# Patient Record
Sex: Female | Born: 1935 | Race: White | Hispanic: No | Smoking: Former smoker
Health system: Southern US, Community
[De-identification: ages and names within clinical notes are randomized; demographics above are authoritative.]

## PROBLEM LIST (undated history)

## (undated) DIAGNOSIS — R002 Palpitations: Secondary | ICD-10-CM

## (undated) DIAGNOSIS — F419 Anxiety disorder, unspecified: Secondary | ICD-10-CM

## (undated) DIAGNOSIS — I350 Nonrheumatic aortic (valve) stenosis: Secondary | ICD-10-CM

## (undated) DIAGNOSIS — R079 Chest pain, unspecified: Secondary | ICD-10-CM

## (undated) DIAGNOSIS — K219 Gastro-esophageal reflux disease without esophagitis: Secondary | ICD-10-CM

## (undated) DIAGNOSIS — I10 Essential (primary) hypertension: Secondary | ICD-10-CM

## (undated) DIAGNOSIS — N814 Uterovaginal prolapse, unspecified: Secondary | ICD-10-CM

## (undated) DIAGNOSIS — N289 Disorder of kidney and ureter, unspecified: Secondary | ICD-10-CM

## (undated) DIAGNOSIS — M810 Age-related osteoporosis without current pathological fracture: Secondary | ICD-10-CM

## (undated) DIAGNOSIS — N183 Chronic kidney disease, stage 3 (moderate): Secondary | ICD-10-CM

## (undated) DIAGNOSIS — R011 Cardiac murmur, unspecified: Secondary | ICD-10-CM

## (undated) DIAGNOSIS — Z86718 Personal history of other venous thrombosis and embolism: Secondary | ICD-10-CM

## (undated) DIAGNOSIS — H3530 Unspecified macular degeneration: Secondary | ICD-10-CM

## (undated) DIAGNOSIS — M519 Unspecified thoracic, thoracolumbar and lumbosacral intervertebral disc disorder: Secondary | ICD-10-CM

## (undated) HISTORY — PX: VITRECTOMY: SHX106

## (undated) HISTORY — PX: CHOLECYSTECTOMY: SHX55

## (undated) HISTORY — DX: Personal history of other venous thrombosis and embolism: Z86.718

## (undated) HISTORY — DX: Unspecified thoracic, thoracolumbar and lumbosacral intervertebral disc disorder: M51.9

## (undated) HISTORY — DX: Anxiety disorder, unspecified: F41.9

## (undated) HISTORY — DX: Uterovaginal prolapse, unspecified: N81.4

## (undated) HISTORY — PX: FINGER GANGLION CYST EXCISION: SHX1636

## (undated) HISTORY — PX: CERVICAL POLYPECTOMY: SHX88

## (undated) HISTORY — DX: Disorder of kidney and ureter, unspecified: N28.9

## (undated) HISTORY — DX: Gastro-esophageal reflux disease without esophagitis: K21.9

## (undated) HISTORY — PX: KNEE ARTHROSCOPY: SUR90

## (undated) HISTORY — DX: Palpitations: R00.2

## (undated) HISTORY — DX: Unspecified macular degeneration: H35.30

## (undated) HISTORY — DX: Essential (primary) hypertension: I10

---

## 2000-08-31 ENCOUNTER — Encounter: Payer: Self-pay | Admitting: Internal Medicine

## 2000-08-31 ENCOUNTER — Ambulatory Visit (HOSPITAL_COMMUNITY): Admission: RE | Admit: 2000-08-31 | Discharge: 2000-08-31 | Payer: Self-pay | Admitting: Internal Medicine

## 2001-02-04 ENCOUNTER — Ambulatory Visit (HOSPITAL_COMMUNITY): Admission: RE | Admit: 2001-02-04 | Discharge: 2001-02-04 | Payer: Self-pay | Admitting: Internal Medicine

## 2001-02-11 ENCOUNTER — Emergency Department (HOSPITAL_COMMUNITY): Admission: EM | Admit: 2001-02-11 | Discharge: 2001-02-11 | Payer: Self-pay | Admitting: *Deleted

## 2001-05-16 ENCOUNTER — Ambulatory Visit (HOSPITAL_COMMUNITY): Admission: RE | Admit: 2001-05-16 | Discharge: 2001-05-16 | Payer: Self-pay | Admitting: Internal Medicine

## 2001-05-16 ENCOUNTER — Encounter: Payer: Self-pay | Admitting: Internal Medicine

## 2001-06-05 ENCOUNTER — Ambulatory Visit (HOSPITAL_COMMUNITY): Admission: RE | Admit: 2001-06-05 | Discharge: 2001-06-05 | Payer: Self-pay | Admitting: Internal Medicine

## 2001-06-27 ENCOUNTER — Ambulatory Visit (HOSPITAL_COMMUNITY): Admission: RE | Admit: 2001-06-27 | Discharge: 2001-06-27 | Payer: Self-pay | Admitting: Internal Medicine

## 2001-06-27 ENCOUNTER — Encounter: Payer: Self-pay | Admitting: Internal Medicine

## 2001-08-07 ENCOUNTER — Ambulatory Visit (HOSPITAL_COMMUNITY): Admission: RE | Admit: 2001-08-07 | Discharge: 2001-08-07 | Payer: Self-pay | Admitting: Internal Medicine

## 2001-08-07 ENCOUNTER — Encounter: Payer: Self-pay | Admitting: Internal Medicine

## 2001-08-13 ENCOUNTER — Encounter: Payer: Self-pay | Admitting: Internal Medicine

## 2001-08-13 ENCOUNTER — Ambulatory Visit (HOSPITAL_COMMUNITY): Admission: RE | Admit: 2001-08-13 | Discharge: 2001-08-13 | Payer: Self-pay | Admitting: Internal Medicine

## 2001-10-03 ENCOUNTER — Ambulatory Visit (HOSPITAL_COMMUNITY): Admission: RE | Admit: 2001-10-03 | Discharge: 2001-10-03 | Payer: Self-pay | Admitting: Obstetrics and Gynecology

## 2001-10-03 ENCOUNTER — Encounter (INDEPENDENT_AMBULATORY_CARE_PROVIDER_SITE_OTHER): Payer: Self-pay

## 2002-01-19 ENCOUNTER — Emergency Department (HOSPITAL_COMMUNITY): Admission: EM | Admit: 2002-01-19 | Discharge: 2002-01-19 | Payer: Self-pay | Admitting: Emergency Medicine

## 2002-01-19 ENCOUNTER — Encounter: Payer: Self-pay | Admitting: Emergency Medicine

## 2002-07-16 ENCOUNTER — Ambulatory Visit (HOSPITAL_COMMUNITY): Admission: RE | Admit: 2002-07-16 | Discharge: 2002-07-16 | Payer: Self-pay | Admitting: Internal Medicine

## 2002-07-16 ENCOUNTER — Encounter: Payer: Self-pay | Admitting: Internal Medicine

## 2002-10-18 ENCOUNTER — Emergency Department (HOSPITAL_COMMUNITY): Admission: EM | Admit: 2002-10-18 | Discharge: 2002-10-18 | Payer: Self-pay | Admitting: Emergency Medicine

## 2002-10-18 ENCOUNTER — Encounter: Payer: Self-pay | Admitting: Emergency Medicine

## 2003-01-14 ENCOUNTER — Ambulatory Visit (HOSPITAL_COMMUNITY): Admission: RE | Admit: 2003-01-14 | Discharge: 2003-01-14 | Payer: Self-pay | Admitting: Urology

## 2003-05-02 ENCOUNTER — Emergency Department (HOSPITAL_COMMUNITY): Admission: EM | Admit: 2003-05-02 | Discharge: 2003-05-02 | Payer: Self-pay | Admitting: Emergency Medicine

## 2003-10-01 ENCOUNTER — Ambulatory Visit (HOSPITAL_COMMUNITY): Admission: RE | Admit: 2003-10-01 | Discharge: 2003-10-01 | Payer: Self-pay | Admitting: Internal Medicine

## 2004-02-01 ENCOUNTER — Encounter: Admission: RE | Admit: 2004-02-01 | Discharge: 2004-02-01 | Payer: Self-pay | Admitting: Orthopedic Surgery

## 2004-02-03 ENCOUNTER — Ambulatory Visit (HOSPITAL_COMMUNITY): Admission: RE | Admit: 2004-02-03 | Discharge: 2004-02-03 | Payer: Self-pay | Admitting: Orthopedic Surgery

## 2004-02-03 ENCOUNTER — Encounter (INDEPENDENT_AMBULATORY_CARE_PROVIDER_SITE_OTHER): Payer: Self-pay | Admitting: *Deleted

## 2004-02-03 ENCOUNTER — Ambulatory Visit (HOSPITAL_BASED_OUTPATIENT_CLINIC_OR_DEPARTMENT_OTHER): Admission: RE | Admit: 2004-02-03 | Discharge: 2004-02-03 | Payer: Self-pay | Admitting: Orthopedic Surgery

## 2004-03-10 ENCOUNTER — Ambulatory Visit: Payer: Self-pay | Admitting: Cardiology

## 2004-06-02 ENCOUNTER — Ambulatory Visit (HOSPITAL_COMMUNITY): Admission: RE | Admit: 2004-06-02 | Discharge: 2004-06-02 | Payer: Self-pay | Admitting: Internal Medicine

## 2005-01-26 ENCOUNTER — Encounter (HOSPITAL_COMMUNITY): Admission: RE | Admit: 2005-01-26 | Discharge: 2005-02-25 | Payer: Self-pay | Admitting: Endocrinology

## 2005-03-08 ENCOUNTER — Ambulatory Visit (HOSPITAL_COMMUNITY): Admission: RE | Admit: 2005-03-08 | Discharge: 2005-03-08 | Payer: Self-pay | Admitting: Internal Medicine

## 2005-11-01 ENCOUNTER — Emergency Department (HOSPITAL_COMMUNITY): Admission: EM | Admit: 2005-11-01 | Discharge: 2005-11-01 | Payer: Self-pay | Admitting: Emergency Medicine

## 2005-12-04 ENCOUNTER — Emergency Department (HOSPITAL_COMMUNITY): Admission: EM | Admit: 2005-12-04 | Discharge: 2005-12-04 | Payer: Self-pay | Admitting: Emergency Medicine

## 2006-04-02 ENCOUNTER — Ambulatory Visit (HOSPITAL_COMMUNITY): Admission: RE | Admit: 2006-04-02 | Discharge: 2006-04-02 | Payer: Self-pay | Admitting: Ophthalmology

## 2006-04-19 ENCOUNTER — Ambulatory Visit (HOSPITAL_COMMUNITY): Admission: RE | Admit: 2006-04-19 | Discharge: 2006-04-19 | Payer: Self-pay | Admitting: Internal Medicine

## 2006-09-06 ENCOUNTER — Ambulatory Visit: Payer: Self-pay | Admitting: Orthopedic Surgery

## 2006-10-11 ENCOUNTER — Ambulatory Visit: Payer: Self-pay | Admitting: Orthopedic Surgery

## 2007-05-20 ENCOUNTER — Encounter: Payer: Self-pay | Admitting: Orthopedic Surgery

## 2007-05-20 ENCOUNTER — Emergency Department (HOSPITAL_COMMUNITY): Admission: EM | Admit: 2007-05-20 | Discharge: 2007-05-20 | Payer: Self-pay | Admitting: Emergency Medicine

## 2007-05-21 ENCOUNTER — Ambulatory Visit: Payer: Self-pay | Admitting: Orthopedic Surgery

## 2007-05-30 ENCOUNTER — Emergency Department (HOSPITAL_COMMUNITY): Admission: EM | Admit: 2007-05-30 | Discharge: 2007-05-30 | Payer: Self-pay | Admitting: Emergency Medicine

## 2007-07-03 ENCOUNTER — Ambulatory Visit (HOSPITAL_COMMUNITY): Admission: RE | Admit: 2007-07-03 | Discharge: 2007-07-03 | Payer: Self-pay | Admitting: Internal Medicine

## 2007-07-16 ENCOUNTER — Ambulatory Visit: Payer: Self-pay | Admitting: Orthopedic Surgery

## 2007-07-16 ENCOUNTER — Ambulatory Visit (HOSPITAL_COMMUNITY): Admission: RE | Admit: 2007-07-16 | Discharge: 2007-07-16 | Payer: Self-pay | Admitting: Orthopedic Surgery

## 2007-07-22 ENCOUNTER — Ambulatory Visit (HOSPITAL_COMMUNITY): Admission: RE | Admit: 2007-07-22 | Discharge: 2007-07-22 | Payer: Self-pay | Admitting: Ophthalmology

## 2007-08-12 ENCOUNTER — Ambulatory Visit (HOSPITAL_COMMUNITY): Admission: RE | Admit: 2007-08-12 | Discharge: 2007-08-12 | Payer: Self-pay | Admitting: Ophthalmology

## 2007-10-07 ENCOUNTER — Ambulatory Visit (HOSPITAL_COMMUNITY): Admission: RE | Admit: 2007-10-07 | Discharge: 2007-10-07 | Payer: Self-pay | Admitting: Ophthalmology

## 2008-06-29 ENCOUNTER — Other Ambulatory Visit: Admission: RE | Admit: 2008-06-29 | Discharge: 2008-06-29 | Payer: Self-pay | Admitting: Obstetrics & Gynecology

## 2008-08-31 ENCOUNTER — Ambulatory Visit (HOSPITAL_COMMUNITY): Admission: RE | Admit: 2008-08-31 | Discharge: 2008-08-31 | Payer: Self-pay | Admitting: Internal Medicine

## 2008-11-09 ENCOUNTER — Ambulatory Visit (HOSPITAL_COMMUNITY): Payer: Self-pay | Admitting: Internal Medicine

## 2008-11-09 ENCOUNTER — Encounter (HOSPITAL_COMMUNITY): Admission: RE | Admit: 2008-11-09 | Discharge: 2008-12-09 | Payer: Self-pay | Admitting: Internal Medicine

## 2009-02-08 ENCOUNTER — Ambulatory Visit (HOSPITAL_COMMUNITY): Payer: Self-pay | Admitting: Internal Medicine

## 2009-02-08 ENCOUNTER — Encounter (HOSPITAL_COMMUNITY): Admission: RE | Admit: 2009-02-08 | Discharge: 2009-03-10 | Payer: Self-pay | Admitting: Internal Medicine

## 2009-05-04 ENCOUNTER — Encounter (HOSPITAL_COMMUNITY): Admission: RE | Admit: 2009-05-04 | Discharge: 2009-06-09 | Payer: Self-pay | Admitting: Internal Medicine

## 2009-05-10 ENCOUNTER — Ambulatory Visit (HOSPITAL_COMMUNITY): Payer: Self-pay | Admitting: Internal Medicine

## 2009-08-09 ENCOUNTER — Ambulatory Visit (HOSPITAL_COMMUNITY): Payer: Self-pay | Admitting: Internal Medicine

## 2009-08-09 ENCOUNTER — Encounter (HOSPITAL_COMMUNITY): Admission: RE | Admit: 2009-08-09 | Discharge: 2009-09-08 | Payer: Self-pay | Admitting: Internal Medicine

## 2009-10-17 ENCOUNTER — Emergency Department (HOSPITAL_COMMUNITY)
Admission: EM | Admit: 2009-10-17 | Discharge: 2009-10-17 | Payer: Self-pay | Source: Home / Self Care | Admitting: Emergency Medicine

## 2009-11-08 ENCOUNTER — Encounter (HOSPITAL_COMMUNITY)
Admission: RE | Admit: 2009-11-08 | Discharge: 2009-12-08 | Payer: Self-pay | Source: Home / Self Care | Admitting: Internal Medicine

## 2009-11-08 ENCOUNTER — Ambulatory Visit (HOSPITAL_COMMUNITY): Payer: Self-pay | Admitting: Internal Medicine

## 2009-11-15 ENCOUNTER — Encounter (INDEPENDENT_AMBULATORY_CARE_PROVIDER_SITE_OTHER): Payer: Self-pay

## 2009-11-15 ENCOUNTER — Ambulatory Visit: Payer: Self-pay | Admitting: Cardiology

## 2009-11-15 DIAGNOSIS — I10 Essential (primary) hypertension: Secondary | ICD-10-CM

## 2009-11-15 DIAGNOSIS — R002 Palpitations: Secondary | ICD-10-CM

## 2009-11-16 ENCOUNTER — Encounter: Payer: Self-pay | Admitting: Cardiology

## 2009-11-22 ENCOUNTER — Encounter (HOSPITAL_COMMUNITY): Admission: RE | Admit: 2009-11-22 | Payer: Self-pay | Admitting: Oncology

## 2009-12-16 ENCOUNTER — Ambulatory Visit: Payer: Self-pay | Admitting: Cardiology

## 2009-12-16 ENCOUNTER — Encounter: Payer: Self-pay | Admitting: Adult Health

## 2009-12-16 LAB — CONVERTED CEMR LAB
Chloride: 98 meq/L (ref 96–112)
Creatinine, Ser: 1.46 mg/dL — ABNORMAL HIGH (ref 0.40–1.20)
Potassium: 3.8 meq/L (ref 3.5–5.3)
Sodium: 139 meq/L (ref 135–145)

## 2009-12-23 ENCOUNTER — Telehealth (INDEPENDENT_AMBULATORY_CARE_PROVIDER_SITE_OTHER): Payer: Self-pay

## 2010-01-18 ENCOUNTER — Encounter: Payer: Self-pay | Admitting: Cardiology

## 2010-01-20 ENCOUNTER — Ambulatory Visit (HOSPITAL_COMMUNITY)
Admission: RE | Admit: 2010-01-20 | Discharge: 2010-01-20 | Payer: Self-pay | Source: Home / Self Care | Attending: Internal Medicine | Admitting: Internal Medicine

## 2010-02-06 ENCOUNTER — Encounter: Payer: Self-pay | Admitting: Endocrinology

## 2010-02-07 ENCOUNTER — Ambulatory Visit (HOSPITAL_COMMUNITY)
Admission: RE | Admit: 2010-02-07 | Discharge: 2010-02-15 | Payer: Self-pay | Source: Home / Self Care | Attending: Internal Medicine | Admitting: Internal Medicine

## 2010-02-07 ENCOUNTER — Encounter (HOSPITAL_COMMUNITY)
Admission: RE | Admit: 2010-02-07 | Discharge: 2010-02-15 | Payer: Self-pay | Source: Home / Self Care | Attending: Internal Medicine | Admitting: Internal Medicine

## 2010-02-09 ENCOUNTER — Other Ambulatory Visit: Payer: Self-pay | Admitting: Dermatology

## 2010-02-15 NOTE — Letter (Signed)
Summary: dr fagan records  dr fagan records   Imported By: Faythe Ghee 11/16/2009 13:23:42  _____________________________________________________________________  External Attachment:    Type:   Image     Comment:   External Document

## 2010-02-15 NOTE — Assessment & Plan Note (Signed)
Summary: pt states she has been having "heavy heartbeat"/tg   Visit Type:  Follow-up Primary Provider:  Dr. Carylon Perches  CC:  heavy heartbeat.  History of Present Illness: [name] in a pleasant 77 y/o CF we are following for continued assessment and treatment of palpatations. On last visit, she had a cardionet monitor placed to assess frequency and duration of her complaints.  She decribes this as a "heavy beat" usually occuring in the am.  She is on diltiazem at 30mg  three times a day per Dr. Ouida Sills along with Metroprolol.  She did not take her cardiazem this afternoon, and states she is feeling extra palpatations.  She states she can tell a difference when she does not take the CCB., as her "heavy beats" occur more often.    Current Medications (verified): 1)  Ramipril 10 Mg Caps (Ramipril) .... Take 1 Tab Daily 2)  Aspir-Low 81 Mg Tbec (Aspirin) .... Take 1 Tab Daily 3)  Dulcolax Stool Softener 100 Mg Caps (Docusate Sodium) .... Take 1 Tab Three Times A Day 4)  Hydrochlorothiazide 25 Mg Tabs (Hydrochlorothiazide) .... Take 1/2 Tablet By Mouth Once Daily 5)  Pantoprazole Sodium 40 Mg Tbec (Pantoprazole Sodium) .... Take 1 Tab Daily 6)  Icaps  Caps (Multiple Vitamins-Minerals) .... Take 1 Tab Daily 7)  Diltiazem Hcl 30 Mg Tabs (Diltiazem Hcl) .... Take 3 Tabs Daily 8)  Citalopram Hydrobromide 10 Mg Tabs (Citalopram Hydrobromide) .... Take 1 Tab Daily 9)  Toprol Xl 100 Mg Xr24h-Tab (Metoprolol Succinate) .... Take 1 Tab Daily 10)  Vitamin D3 1000 Unit Caps (Cholecalciferol) .... Take 1 Tab Daily 11)  Boniva 150 Mg Tabs (Ibandronate Sodium) .... Take 1 Every 3 Months  Allergies (verified): 1)  ! * Amoxycillin  Comments:  Nurse/Medical Assistant: patient brought med list [facility] uses [facility] in [place]  Review of Systems       Palpatations  All other systems have been reviewed and are negative unless stated above.   Vital Signs:  Patient profile:   77 year old female Weight:       181 pounds O2 Sat:      92 % on Room air Pulse rate:   76 / minute BP sitting:   107 / 56  (right arm)  Vitals Entered By: Dreama Saa, CNA (December 16, 2009 2:33 PM)  O2 Flow:  Room air  Physical Exam  General:  Well developed, well nourished, in no acute distress. Lungs:  Clear bilaterally to auscultation and percussion. Heart:  Non-displaced PMI, chest non-tender; regular rate and rhythm, S1, S2 without murmurs, rubs or gallops. Carotid upstroke normal, no bruit. Normal abdominal aortic size, no bruits. Femorals normal pulses, no bruits. Pedals normal pulses. No edema, no varicosities. Abdomen:  Bowel sounds positive; abdomen soft and non-tender without masses, organomegaly, or hernias noted. No hepatosplenomegaly. Msk:  Back normal, normal gait. Muscle strength and tone normal. Pulses:  pulses normal in all 4 extremities Extremities:  No clubbing or cyanosis. Neurologic:  Alert and oriented x 3. Psych:  Normal affect.   Impression & Recommendations:  Problem # 1:  PALPITATIONS (ICD-785.1) Review of cardionet recording did not show evidence of atrial tachycardia, frequent PVC's.  It has not been officially read by Dr. Diona Browner yet.  This will be made available for him to review. She is not compaining of "heavy heart beat" unless she does not take cardiazem.  I will check BMET to assess potassium status in the setting of HCTZ taken daily.  She will follow up with Dr. Diona Browner. Her updated medication list for this problem includes:    Ramipril 10 Mg Caps (Ramipril) .Marland Kitchen... Take 1 tab daily    Aspir-low 81 Mg Tbec (Aspirin) .Marland Kitchen... Take 1 tab daily    Diltiazem Hcl 30 Mg Tabs (Diltiazem hcl) .Marland Kitchen... Take 3 tabs daily    Toprol Xl 100 Mg Xr24h-tab (Metoprolol succinate) .Marland Kitchen... Take 1 tab daily  Problem # 2:  ESSENTIAL HYPERTENSION, BENIGN (ICD-401.1) She is mildly hypotensive at present.  Will decrease HCTZ to 12.5 mg daily.  She will have BP check in one week. Her updated  medication list for this problem includes:    Ramipril 10 Mg Caps (Ramipril) .Marland Kitchen... Take 1 tab daily    Aspir-low 81 Mg Tbec (Aspirin) .Marland Kitchen... Take 1 tab daily    Hydrochlorothiazide 25 Mg Tabs (Hydrochlorothiazide) .Marland Kitchen... Take 1/2 tablet by mouth once daily    Diltiazem Hcl 30 Mg Tabs (Diltiazem hcl) .Marland Kitchen... Take 3 tabs daily    Toprol Xl 100 Mg Xr24h-tab (Metoprolol succinate) .Marland Kitchen... Take 1 tab daily  Orders: T-Basic Metabolic Panel [phone])  Patient Instructions: 1)  Your physician recommends that you schedule a follow-up appointment in: 3 months 2)  Your physician recommends that you return for lab work in: today 3)  Your physician has recommended you make the following change in your medication: Decrease Hydrochlorothiazide to 12.5mg  (1/2 tablet of 25mg  tablet) Prescriptions: HYDROCHLOROTHIAZIDE 25 MG TABS (HYDROCHLOROTHIAZIDE) take 1/2 tablet by mouth once daily  #15 x 3   Entered by:   Larita Fife Via LPN   Authorized by:   Joni Reining, NP   Signed by:   Larita Fife Via LPN on 28/41/3244   Method used:   Electronically to        [facility] (retail)       [address]       [place], [place]  [address]       Ph: [phone]       Fax: [phone]   RxID:   [id number]

## 2010-02-15 NOTE — Progress Notes (Signed)
Summary: [employer] event monitor-bill  Phone Note Outgoing Call   Call placed by: Larita Fife Via LPN,  December 23, 2009 1:44 PM Summary of Call: Advised pt. that we are working on the bill for the event monitor she wore in Nov. Bill sent to [employer] (pt's secondary insurance) needs to be sent to Medicare (pt's primary). Initial call taken by: Larita Fife Via LPN,  December 23, 2009 1:50 PM

## 2010-02-15 NOTE — Miscellaneous (Signed)
Summary: Orders Update  Clinical Lists Changes  Orders: Added new Referral order of Cardionet/Event Monitor (Cardionet/Event) - Signed

## 2010-02-15 NOTE — Assessment & Plan Note (Signed)
Summary: ***NP6 PALPITATIONS`LAST SEEN RR IN 2006   Visit Type:  Initial Consult Primary Provider:  Dr. Carylon Perches   History of Present Illness: 77 year old woman referred for cardiology consultation. She is a former patient of Dr. Dietrich Pates, seen last back in 2006 with documented history of palpitations although no specific dysrhythmias uncovered.  She describes increasing sense of palpitations over the last several weeks, usually at nighttime. She describes both a "heavy beat" and at times a "flutter " Sometimes she has some mild discomfort in her left shoulder with this occurs. Also has some "sweating" when she goes to the bathroom at night time. She will occasionally experience these palpitations in the daytime, usually fairly sudden. Never associated with dizziness, chest pain, or syncope. They may last up to 20 minutes. Dr. Ouida Sills has increased her diltiazem dosing over the last few weeks.  Fax a copy of ECG from 17 October is reviewed showing sinus rhythm at 79 beats per minutes with PR interval 191 ms, nonspecific T-wave changes. Recent labs from 2 October showed sodium 139, potassium 3.8, BUN 14, creatinine 1.2, troponin I less than 0.05.  Current Medications (verified): 1)  Ramipril 10 Mg Caps (Ramipril) .... Take 1 Tab Daily 2)  Aspir-Low 81 Mg Tbec (Aspirin) .... Take 1 Tab Daily 3)  Dulcolax Stool Softener 100 Mg Caps (Docusate Sodium) .... Take 1 Tab Three Times A Day 4)  Hydrochlorothiazide 25 Mg Tabs (Hydrochlorothiazide) .... Take 1 Tab Daily 5)  Pantoprazole Sodium 40 Mg Tbec (Pantoprazole Sodium) .... Take 1 Tab Daily 6)  Icaps  Caps (Multiple Vitamins-Minerals) .... Take 1 Tab Daily 7)  Diltiazem Hcl 30 Mg Tabs (Diltiazem Hcl) .... Take 3 Tabs Daily 8)  Citalopram Hydrobromide 10 Mg Tabs (Citalopram Hydrobromide) .... Take 1 Tab Daily 9)  Toprol Xl 100 Mg Xr24h-Tab (Metoprolol Succinate) .... Take 1 Tab Daily 10)  Vitamin D3 1000 Unit Caps (Cholecalciferol) .... Take 1 Tab  Daily 11)  Boniva 150 Mg Tabs (Ibandronate Sodium) .... Take 1 Tab Monthly  Allergies (verified): 1)  ! * Amoxycillin  Comments:  Nurse/Medical Assistant: patient brought meds   Past History:  Family History: Last updated: 11/15/2009 Noncontributory  Social History: Last updated: 11/12/2009 Tobacco Use - No.  Alcohol Use - no Regular Exercise - no Drug Use - no  Past Medical History: Hypertension Palpitations Lumbar disc disease Osteoporosis G E R D Anxiety History of DVT Renal insufficiency Macular degeneration Uterine prolapse  Past Surgical History: Cholecystectomy Right knee arthroscopy Excision of interest cervical/endometrial lesion Excision mucoid cyst left middle finger Partial vitrectomy left eye  Family History: Noncontributory  Review of Systems  The patient denies anorexia, fever, weight loss, chest pain, syncope, dyspnea on exertion, peripheral edema, prolonged cough, hemoptysis, melena, and hematochezia.         Otherwise reviewed and negative.  Vital Signs:  Patient profile:   77 year old female Height:      61 inches Weight:      185 pounds BMI:     35.08 Pulse rate:   75 / minute BP sitting:   107 / 65  (right arm)  Vitals Entered By: Dreama Saa, CNA (November 15, 2009 8:22 AM)  Physical Exam  Additional Exam:  Overweight elderly woman in no acute distress. HEENT: Conjunctiva and lids normal, oropharynx with moist mucosa. Neck: Supple, no elevated JVP or loud bruits, no thyromegaly. Lungs: Clear to auscultation, nonlabored. Cardiac: Regular rate and rhythm, no pathologic systolic murmur or S3 gallop. Abdomen: Soft,  nontender, bowel sounds present. Skin: Warm and dry. Extremities: No significant pitting edema, trace ankle edema noted. Distal pulses full. Musculoskeletal: Kyphosis noted. Neuropsychiatric: Alert and oriented x3, affect appropriate.   Holter Monitor  Procedure date:  02/04/2001  Findings:       INDICATION:  [age]-year-old woman with palpitations.  1. Continuous electrocardiographic monitoring maintained for 24 hours, during    which time the predominant rhythm was normal sinus with minimal sinus    bradycardia and sinus tachycardia; borderline first-degree A-V block was    present. 2. There were rare PVCs, a total of two during the entire record. 3. Occasional supraventricular ectopy also occurred; the average rate was four    per hour. 4. No significant ST segment abnormalities noted. 5. Multiple episodes of palpitations were reported.  The rhythm was    normal sinus on all occasions.  Nuclear Study  Procedure date:  04/16/2000  Findings:      Dipyridamole Cardiolite:  No diagnostic ST segment changes with evidence of minimal breast attenuation artifact. No ischemia, LVEF 60%.  Echocardiogram  Procedure date:  02/28/2000  Findings:      Normal LVEF with borderline LVH, mitral annular calcification with minimal MR, aortic valve sclerosis without stenosis, mild LAE, normal RV size.  EKG  Procedure date:  11/15/2009  Findings:      Normal sinus rhythm at 71 beats per minute with PR interval 184 ms, decreased R wave progression anteriorly without Q waves.  Impression & Recommendations:  Problem # 1:  PALPITATIONS (ICD-785.1)  Long-standing history of intermittent palpitations as noted above. Patient reports an increase in the symptoms of late. No associated dizziness or syncope. She has had occasional PACs noted on monitoring several years ago. Agree with combination of Toprol-XL and diltiazem, although it may be reasonable to consolidate diltiazem to once daily dosing eventually. We will provide a 21 day CardioNet monitor, and I will bring her back to the office to discuss the results. No other cardiac testing at this time.  Her updated medication list for this problem includes:    Ramipril 10 Mg Caps (Ramipril) .Marland Kitchen... Take 1 tab daily    Aspir-low 81 Mg Tbec  (Aspirin) .Marland Kitchen... Take 1 tab daily    Diltiazem Hcl 30 Mg Tabs (Diltiazem hcl) .Marland Kitchen... Take 3 tabs daily    Toprol Xl 100 Mg Xr24h-tab (Metoprolol succinate) .Marland Kitchen... Take 1 tab daily  Problem # 2:  ESSENTIAL HYPERTENSION, BENIGN (ICD-401.1)  Blood pressure well controlled today.  Her updated medication list for this problem includes:    Ramipril 10 Mg Caps (Ramipril) .Marland Kitchen... Take 1 tab daily    Aspir-low 81 Mg Tbec (Aspirin) .Marland Kitchen... Take 1 tab daily    Hydrochlorothiazide 25 Mg Tabs (Hydrochlorothiazide) .Marland Kitchen... Take 1 tab daily    Diltiazem Hcl 30 Mg Tabs (Diltiazem hcl) .Marland Kitchen... Take 3 tabs daily    Toprol Xl 100 Mg Xr24h-tab (Metoprolol succinate) .Marland Kitchen... Take 1 tab daily  Patient Instructions: 1)  Your physician recommends that you schedule a follow-up appointment in: 1 month 2)  Your physician recommends that you continue on your current medications as directed. Please refer to the Current Medication list given to you today. 3)  Your physician has recommended that you wear an event monitor.  Event monitors are medical devices that record the heart's electrical activity. Doctors most often use these monitors to diagnose arrhythmias. Arrhythmias are problems with the speed or rhythm of the heartbeat. The monitor is a small, portable device. You can wear  one while you do your normal daily activities. This is usually used to diagnose what is causing palpitations/syncope (passing out).

## 2010-02-16 ENCOUNTER — Ambulatory Visit (HOSPITAL_COMMUNITY): Payer: Medicare Other

## 2010-03-21 ENCOUNTER — Ambulatory Visit (INDEPENDENT_AMBULATORY_CARE_PROVIDER_SITE_OTHER): Payer: Medicare Other | Admitting: Cardiology

## 2010-03-21 ENCOUNTER — Encounter: Payer: Self-pay | Admitting: Cardiology

## 2010-03-21 DIAGNOSIS — I10 Essential (primary) hypertension: Secondary | ICD-10-CM

## 2010-03-21 DIAGNOSIS — R002 Palpitations: Secondary | ICD-10-CM

## 2010-03-22 ENCOUNTER — Encounter: Payer: Self-pay | Admitting: Cardiology

## 2010-03-29 NOTE — Miscellaneous (Signed)
Summary: [facility]   Imported By: Faythe Ghee 03/22/2010 11:11:18  _____________________________________________________________________  External Attachment:    Type:   Image     Comment:   External Document

## 2010-03-29 NOTE — Assessment & Plan Note (Signed)
Summary: 3 mth f/u per checkout on 12/16/09/tg/tg   Visit Type:  Follow-up Primary Provider:  Dr. Carylon Perches   History of Present Illness: 77 year old woman presents for followup. She was seen by Ms. Lawrence in December 2011. At that time followup lab work was obtained, showing sodium 139, potassium 3.8, BUN 20, creatinine 1.4. Hydrochlorothiazide was reduced.  She reports doing relatively well, much improved palpitations on diltiazem and metoprolol. Prior cardiac monitoring reviewed and scanned into EMR.  Lab work from Dr. Ouida Sills in December 2011 showed cholesterol 186, triglycerides 123, HDL 57, LDL 104, AST 13, ALT less than 8.  She reports some musculoskeletal sounding left shoulder discomfort, now resolved. No exertional chest pain or progressive shortness of breath.  Clinical Review Panels:  Echocardiogram Echocardiogram Normal LVEF with borderline LVH, mitral annular calcification with minimal MR, aortic valve sclerosis without stenosis, mild LAE, normal RV size. (02/28/2000)    Current Medications (verified): 1)  Ramipril 10 Mg Caps (Ramipril) .... Take 1 Tab Daily 2)  Aspir-Low 81 Mg Tbec (Aspirin) .... Take 1 Tab Daily 3)  Dulcolax Stool Softener 100 Mg Caps (Docusate Sodium) .... Take 1 Tab Three Times A Day 4)  Hydrochlorothiazide 25 Mg Tabs (Hydrochlorothiazide) .... Take 1/2 Tablet By Mouth Once Daily 5)  Pantoprazole Sodium 40 Mg Tbec (Pantoprazole Sodium) .... Take 1 Tab Daily 6)  Icaps  Caps (Multiple Vitamins-Minerals) .... Take 1 Tab Daily 7)  Diltiazem Hcl 30 Mg Tabs (Diltiazem Hcl) .... Take 3 Tabs Daily 8)  Citalopram Hydrobromide 10 Mg Tabs (Citalopram Hydrobromide) .... Take 1 Tab Daily 9)  Toprol Xl 100 Mg Xr24h-Tab (Metoprolol Succinate) .... Take 1 Tab Daily 10)  Vitamin D3 1000 Unit Caps (Cholecalciferol) .... Take 1 Tab Daily 11)  Boniva 150 Mg Tabs (Ibandronate Sodium) .... Take 1 Every 3 Months  Allergies (verified): 1)  ! *  Amoxycillin  Comments:  Nurse/Medical Assistant: patient [facility]s [facility] [place]  Past History:  Past Medical History: Last updated: 11/15/2009 Hypertension Palpitations Lumbar disc disease Osteoporosis G E R D Anxiety History of DVT Renal insufficiency Macular degeneration Uterine prolapse  Social History: Last updated: 11/12/2009 Tobacco Use - No.  Alcohol Use - no Regular Exercise - no Drug Use - no  Review of Systems  The patient denies anorexia, fever, weight gain, chest pain, syncope, dyspnea on exertion, peripheral edema, melena, and hematochezia.         Otherwise reviewed and negative except as outlined.  Vital Signs:  Patient profile:   77 year old female Weight:      186 pounds BMI:     35.27 Pulse rate:   71 / minute BP sitting:   121 / 65  (left arm)  Vitals Entered By: Dreama Saa, CNA (March 21, 2010 12:46 PM)  Physical Exam  Additional Exam:  Overweight elderly woman in no acute distress. HEENT: Conjunctiva and lids normal, oropharynx with moist mucosa. Neck: Supple, no elevated JVP or loud bruits, no thyromegaly. Lungs: Clear to auscultation, nonlabored. Cardiac: Regular rate and rhythm, no pathologic systolic murmur or S3 gallop. Abdomen: Soft, nontender, bowel sounds present. Skin: Warm and dry. Extremities: No significant pitting edema, trace ankle edema noted. Distal pulses full. Musculoskeletal: Kyphosis noted. Neuropsychiatric: Alert and oriented x3, affect appropriate.   Impression & Recommendations:  Problem # 1:  PALPITATIONS (ICD-785.1)  Symptomatically improved on diltiazem and metoprolol. No change in present regimen. Followup scheduled for 6 months, sooner if needed.  Her updated medication list  for this problem includes:    Ramipril 10 Mg Caps (Ramipril) .Marland Kitchen... Take 1 tab daily    Aspir-low 81 Mg Tbec (Aspirin) .Marland Kitchen... Take 1 tab daily    Diltiazem Hcl 30 Mg Tabs (Diltiazem hcl) .Marland Kitchen... Take 3 tabs  daily    Toprol Xl 100 Mg Xr24h-tab (Metoprolol succinate) .Marland Kitchen... Take 1 tab daily  Problem # 2:  ESSENTIAL HYPERTENSION, BENIGN (ICD-401.1)  Blood pressure reasonably well controlled today.  Her updated medication list for this problem includes:    Ramipril 10 Mg Caps (Ramipril) .Marland Kitchen... Take 1 tab daily    Aspir-low 81 Mg Tbec (Aspirin) .Marland Kitchen... Take 1 tab daily    Hydrochlorothiazide 25 Mg Tabs (Hydrochlorothiazide) .Marland Kitchen... Take 1/2 tablet by mouth once daily    Diltiazem Hcl 30 Mg Tabs (Diltiazem hcl) .Marland Kitchen... Take 3 tabs daily    Toprol Xl 100 Mg Xr24h-tab (Metoprolol succinate) .Marland Kitchen... Take 1 tab daily  Patient Instructions: 1)  Your physician recommends that you schedule a follow-up appointment in: 6 months 2)  Your physician recommends that you continue on your current medications as directed. Please refer to the Current Medication list given to you today.

## 2010-03-30 LAB — BASIC METABOLIC PANEL
CO2: 26 mEq/L (ref 19–32)
Chloride: 105 mEq/L (ref 96–112)
GFR calc non Af Amer: 41 mL/min — ABNORMAL LOW (ref >60)
Glucose, Bld: 104 mg/dL — ABNORMAL HIGH (ref 70–99)
Potassium: 3.8 mEq/L (ref 3.5–5.1)
Sodium: 139 mEq/L (ref 135–145)

## 2010-03-30 LAB — POCT CARDIAC MARKERS
CKMB, poc: <1 ng/mL — ABNORMAL LOW (ref 1.0–8.0)
Troponin i, poc: <0.05 ng/mL (ref 0.00–0.09)

## 2010-05-09 ENCOUNTER — Ambulatory Visit (HOSPITAL_COMMUNITY): Payer: Medicare Other

## 2010-05-09 ENCOUNTER — Encounter (HOSPITAL_COMMUNITY): Payer: Medicare Other | Attending: Internal Medicine

## 2010-05-09 DIAGNOSIS — M818 Other osteoporosis without current pathological fracture: Secondary | ICD-10-CM | POA: Insufficient documentation

## 2010-05-31 NOTE — Op Note (Signed)
Kristin Rubio, Kristin Rubio                  ACCOUNT NO.:  [id number]   MEDICAL RECORD NO.:  [id number]          PATIENT TYPE:  AMB   LOCATION:  SDS                          FACILITY:  [facility]   PHYSICIAN:  Alford Highland. Rankin, M.D.   DATE OF BIRTH:  November 20, 1917   DATE OF PROCEDURE:  08/12/2007  DATE OF DISCHARGE:  08/12/2007                               OPERATIVE REPORT   PREOPERATIVE DIAGNOSES:  1. Dense epiretinal membrane, left eye.  2. Tractional detachment - macula, left eye secondary to dense      epiretinal membrane, left eye.   POSTOPERATIVE DIAGNOSES:  1. Dense epiretinal membrane, left eye.  2. Tractional detachment - macula, left eye secondary to dense      epiretinal membrane, left eye.  3. 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   PROCEDURES:  1. Partial vitrectomy with membrane peel - epiretinal membrane, left      eye, very dense.  2. Injection of vitreous substitute - C3F8 100% concentration, volume      is 0.1 mL to assist postoperatively in the reattachment to the      macular region with postoperative positioning after the first night      of surgery.   SURGEON:  Alford Highland. Rankin, MD   ANESTHESIA:  Local retrobulbar and monitored anesthesia control.   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  This has now developed a large macular  detachment and deserves removal of what appears to be a postinflammatory  membrane during the procedure.   The patient understands the risks of anesthesia including the rare  occurrence of death, loss of the eye, including, but not limited to  following conditions as well as surgical repair, hemorrhage, infection,  scarring, need for  another surgery, no change in vision, loss of vision,  or progressive disease despite intervention.   DESCRIPTION OF PROCEDURE:  Appropriate signed consent was obtained.  The  patient was taken to the operating room.  In the operating room,  appropriate monitors were followed by mild sedation.  Xylocaine 2% was  injected, 5 mL retrobulbar with additional 5 mL laterally in fashion of  modified Darel Hong.  The left periocular region was sterilely prepped and  draped in the usual sterile fashion.  Lid speculum was applied.  A 25-  gauge trocar was placed in the inferotemporal quadrant.  The infusion  was then turned on.  Superior trocars were applied.  Core vitrectomy was  then begun.  It was not possible to induce posterior hyaloid detachment  because it appeared that the hyaloid was indeed very dense and what  appeared to be possibly a postinflammatory thickening of the posterior  hyaloid with fibrous contracture undergone macular degeneration  found  preoperatively.  The epiretinal membrane was grasped inferior to  the  optic nerve with 25-gauge forceps and successfully began a large sheath-  like peeling from the optic nerves along the inferotemporal arcade and  out over the macular region.  Such a broad sheet of attachment that was  necessary to peel pieces of this back in a limited fashion and then  partially segment it so as to diminish the wide range of differential  tractional forces and to maintain focal tractional forces in the  direction of peel so as to minimize the complications.  At this point,  the membrane peel was carried off the optic nerve of the macular region.  Traction was released in the anterior to the equator 360 degrees.  Vitreous base was then trimmed 360 degrees.  No complications occurred.  Because of the highly and elevated nature of the macular region and the  possibility that an occult macular hole may have formed in the retinal  dehiscence, a decision was made  to place a small amount of intravitreal  gas.  Globe was softened prior to placement of this gas.  It must be  noted that stromal haze immediately found upon the procedure was not a  hindrance to the dissection because I can work around this haze through  the corneal periphery using the BIOM lens.   At this time, the 0.1 mL of the gas injection was placed into the  softened vitreous cavity.  The intraocular pressure was assessed and  found to be adequate.  The optic nerve was perfused.  Subconjunctival  Decadron applied.  Sterile patch and Fox shield were applied.  The  patient tolerated the procedure well and was taken to the short-stay  area and discharged home as an outpatient.      Alford Highland Rankin, M.D.  Electronically Signed     GAR/MEDQ  D:  08/12/2007  T:  08/13/2007  Job:  [id number]

## 2010-06-03 NOTE — Op Note (Signed)
[facility]  Patient:    Kristin Rubio, Kristin Rubio Visit Number: [id number] MRN: [id number]          Service Type: END Location: DAY Attending Physician:  Malissa Hippo Dictated by:   Lionel December, M.D. Proc. Date: 06/05/01 Admit Date:  06/05/2001   CC:         Carylon Perches, M.D.   Operative Report  PROCEDURE:  Total colonoscopy followed by esophagogastroduodenoscopy.  ENDOSCOPIST:  Lionel December, M.D.  INDICATIONS FOR PROCEDURE:  This patient is an 77 year old Caucasian female with a 25-pound weight loss experienced over the last couple of months associated with anorexia and early satiety.  She was seen by Dr. Ouida Sills and noted to have heme positive stools and he also noted a drop in her hemoglobin from 14.6 to 12.5.  She is actually feeling somewhat better.  She feels that her appetite is back, but has not gained any weight.  She is now passing balls, but has not had any melena or rectal bleeding.  Her family history is negative for colorectal carcinoma.  She is on low dose Advil as well as ASA, and therefore, also had a risk for peptic ulcer disease.  I talked with Dr. Ouida Sills over the phone and we decided that if nothing significant was found on colonoscopy she will also undergo esophagogastroduodenoscopy.  The patient was agreeable.  PREOPERATIVE MEDICATIONS:  Cetacaine spray for oropharyngeal topical anesthesia, Demerol 50 mg IV, Versed 7 mg IV in divided dose.  INSTRUMENT:  Olympus video system.  FINDINGS:  Procedure performed in endoscopy suite.  The patients vital signs and O2 saturations were monitored during the procedure and remained stable.  PROCEDURE #1:  Total colonoscopy.  A rectal examination performed.  No abnormalities noted on external or digital exam.  The scope was placed in the rectum and advanced under vision into the sigmoid colon which was very tortuous and relatively fixed with a moderate amount of diverticula.  Some difficulty  encountered in passing the scope to the descending colon.  Further intubation into the cecum was easy. The cecum was identified by ileocecal valve and appendiceal orifice.  She had some stool in the right colon, but I felt that the view was optimal.  There was a small flat polyp on a fold of the ascending colon which was ablated by cold biopsy.  Since she had some stool I did not use the electrocautery.  As the scope was withdrawn the mucosa and the rest of the colon was once again carefully examined and there were no more polyps or masses.  I did not see any stricture in the sigmoid colon.  Rectal mucosa was normal.  The scope was retroflexed to examine the anorectal junction which was unremarkable.  The endoscope was straightened and withdrawn and the patient was prepared for procedure #2.  PROCEDURE #2:  Esophagogastroduodenoscopy.  The endoscope was passed via the oropharynx without any difficulty into the esophagus.  ESOPHAGUS:  The mucosa of the esophagus is normal.  Squamocolumnar junction was ruddy with focal erythema and the was a small sliding hiatal hernia, but no ring or stricture was noted.  STOMACH:  It was empty and distended very well with insufflation.  The folds of the proximal stomach were normal.  Examination of the mucosa revealed a single large erosion at the antrum, and mucosal granularity and erythema, but there was no ulcer.  The pyloric channel was patent.  The angularis and fundus were examined by retroflexing the scope and  were normal.  DUODENUM:  Examination of the bulb and second part of the duodenum was normal.  The endoscope was withdrawn.  The patient tolerated the procedures well.  FINAL DIAGNOSIS: 1. Left colonic diverticulosis. 2. Small polyp ablated from ascending colon via cold biopsy.  No evidence of    colonic stricture or mass. 3. Erosive gastritis. 4. Small sliding hiatal hernia with mild changes of reflux esophagitis limited    to  GE-junction.  RECOMMENDATIONS: 1. High fiber diet. 2. Antireflux measures. 3. Protonix 40 mg p.o. q.a.m.  She will pick up samples from the office. 4. H. pylori serology will be checked. 5. I will be contacting the patient about biopsy and blood test results within    the next few days. 6. I would like for her to follow up with Dr. Ouida Sills in 4 weeks.  If her    symptoms persist and weigh loss continues she will need further evaluation. Dictated by:   Lionel December, M.D. Attending Physician:  Malissa Hippo DD:  06/05/01 TD:  06/06/01 Job: [id number]

## 2010-06-03 NOTE — Op Note (Signed)
Kristin Rubio, Kristin Rubio                            ACCOUNT NO.:  [id number]   MEDICAL RECORD NO.:  [id number]                   PATIENT TYPE:  AMB   LOCATION:  SDC                                  FACILITY:  [facility]   PHYSICIAN:  Daniel L. Eda Paschal, M.D.           DATE OF BIRTH:  12-17-1917   DATE OF PROCEDURE:  10/03/2001  DATE OF DISCHARGE:                                 OPERATIVE REPORT   PREOPERATIVE DIAGNOSIS:  Endocervical versus endometrial polyp.   POSTOPERATIVE DIAGNOSIS:  Endocervical versus endometrial polyp.   PROCEDURE:  Excision of intracervical/endometrial lesion, hysteroscopy,  endometrial sampling.   SURGEON:  Daniel L. Eda Paschal, M.D.   ANESTHESIA:  Intravenous sedation and paracervical block.   INDICATIONS:  The patient is an 77 year old gravida 2, para 2, AB0 who had  presented to her internist and as a result of her complaints had undergone  ultrasound of her pelvis.  Ultrasound of her pelvis did not reveal any  adnexal pathology, but she had an exceedingly enlarged endometrial stripe at  11 mm, and she was not on hormonal replacement therapy.  She was referred to  me, and in the office she had a large fleshy somewhat firm lesion extruding  from the external os.  She now enters the hospital for excision of the  above, hysteroscopy, D&C and any other appropriate procedures related to the  above.   FINDINGS:  External exam is normal.  BUS is normal.  Vaginal examination  reveals a third degree cystocele and a third rectocele.  The cervix reveals  a large lesion extruding from the os.  It is somewhat fleshy, but it has  somewhat of a firm characteristic to it.  The uterus is normal size and  shape.  Adnexa are not palpably enlarged.  When the lesion was removed and  the patient was hysteroscoped, it appeared that the lesion was emanating  from the endometrium and not from the endocervical canal because when her  endocervical canal was hysteroscoped you could not  see where the lesion had  been, yet there was an area that was obviously related to the lesion on her  right lateral uterine wall.  The top of the fundus, tubal ostia, anterior  and posterior walls of the fundus, lower uterine segment, endocervical canal  were free of disease except for pedicle from this lesion.   DESCRIPTION OF PROCEDURE:  The patient was taken to the operating room,  placed in the dorsal lithotomy position, prepped and draped in the usual  sterile manner.  She was given IV sedation by anesthesia and a 20 cc 1%  plain paracervical block by Dr. Eda Paschal.  Anesthesia was excellent.  After  anesthesia had been instituted, the lesion was grasped.  It was twisted.  Sharp incision was also used to cut it and it could be excised almost in its  entirety, and it was sent to pathology for  tissue diagnosis.  Her cervix was  then dilated to a #23 Pratt dilator and then a hysteroscopic examination was  done using 3% sorbitol to expanding the intrauterine cavity and a camera for  magnification.  The only abnormality was that there appeared to be still a  portion of this lesion on the patient's right lateral wall in the uterus.  The hysteroscope was removed.  A sharp curet was utilized and curetting that  area well the entire rest of the lesion was removed.  The patient was  rehysteroscoped.  The area where it had been removed was  not bleeding.  The procedure was terminated.  Estimated blood loss for the  entire procedure was less than 10 cc with none replaced.  Fluid deficit was  0.  The patient tolerated the procedure well and left the operating room in  satisfactory condition.                                               Daniel L. Eda Paschal, M.D.    Kristin Rubio  D:  10/03/2001  T:  10/03/2001  Job:  [id number]

## 2010-06-03 NOTE — Op Note (Signed)
Kristin Rubio, Kristin Rubio                  ACCOUNT NO.:  [id number]   MEDICAL RECORD NO.:  [id number]          PATIENT TYPE:  AMB   LOCATION:  DSC                          FACILITY:  [facility]   PHYSICIAN:  Cindee Salt, M.D.       DATE OF BIRTH:  23-Sep-1917   DATE OF PROCEDURE:  02/03/2004  DATE OF DISCHARGE:                                 OPERATIVE REPORT   PREOPERATIVE DIAGNOSIS:  Mucoid cyst left middle finger.   POSTOPERATIVE DIAGNOSIS:  Mucoid cyst left middle finger.   OPERATION:  Excision mucoid cyst, debridement distal interphalangeal joint  left middle finger.   SURGEON:  Cindee Salt, M.D.   ASSISTANTCarolyne Fiscal.   ANESTHESIA:  Forearm base IV regional.   HISTORY:  The patient is an 77 year old female with a history of a mucoid  cyst deforming her nail plate of her left middle finger extruding through  the cuticle.   PROCEDURE:  The patient is brought to the operating room where a forearm  based IV regional anesthetic was carried out without difficulty. She was  prepped using DuraPrep, supine position, left arm free.  Curvilinear  incision was made over the distal interphalangeal joint carried down through  subcutaneous tissue. Bleeders were electrocauterized. The dissection carried  down the extensor tendon both radially and ulnarly. The joint opened on both  radial and ulnar side.  Debridement was then performed of osteophytes on the  distal portion of the middle phalanx and proximal portion of the distal  phalanx. The cyst was then identified beneath the skin distally and this was  excised using a rongeur. The extruded portion was also removed. The wound  was copiously irrigated with saline and the skin closed interrupted 5-0  nylon sutures. Specimen was sent to pathology. A sterile compressive  dressing and splint to the finger applied. The patient tolerated procedure  well was taken to the recovery observation in satisfactory condition. She is  discharged home to return to the  [facility] [place] in one week on  Vicodin.       GK/MEDQ  D:  02/03/2004  T:  02/03/2004  Job:  [id number]

## 2010-08-01 ENCOUNTER — Encounter (HOSPITAL_COMMUNITY): Payer: Medicare Other | Attending: Oncology

## 2010-08-01 DIAGNOSIS — M810 Age-related osteoporosis without current pathological fracture: Secondary | ICD-10-CM

## 2010-08-01 MED ORDER — SODIUM CHLORIDE 0.9 % IJ SOLN
INTRAMUSCULAR | Status: AC
  Filled 2010-08-01: qty 10

## 2010-08-01 MED ORDER — IBANDRONATE SODIUM 3 MG/3ML IV SOLN
3.0000 mg | Freq: Once | INTRAVENOUS | Status: AC
  Administered 2010-08-01: 3 mg via INTRAVENOUS

## 2010-08-01 MED ORDER — IBANDRONATE SODIUM 3 MG/3ML IV SOLN
INTRAVENOUS | Status: AC
  Administered 2010-08-01: 3 mg via INTRAVENOUS
  Filled 2010-08-01: qty 3

## 2010-10-01 ENCOUNTER — Emergency Department (HOSPITAL_COMMUNITY)
Admission: EM | Admit: 2010-10-01 | Discharge: 2010-10-01 | Disposition: A | Discharge status: Home / Self Care | Payer: Medicare Other | Attending: Emergency Medicine | Admitting: Emergency Medicine

## 2010-10-01 ENCOUNTER — Encounter: Payer: Self-pay | Admitting: *Deleted

## 2010-10-01 DIAGNOSIS — I4891 Unspecified atrial fibrillation: Secondary | ICD-10-CM | POA: Insufficient documentation

## 2010-10-01 DIAGNOSIS — I10 Essential (primary) hypertension: Secondary | ICD-10-CM | POA: Insufficient documentation

## 2010-10-01 DIAGNOSIS — S81809A Unspecified open wound, unspecified lower leg, initial encounter: Secondary | ICD-10-CM | POA: Insufficient documentation

## 2010-10-01 DIAGNOSIS — Y92009 Unspecified place in unspecified non-institutional (private) residence as the place of occurrence of the external cause: Secondary | ICD-10-CM | POA: Insufficient documentation

## 2010-10-01 DIAGNOSIS — Z87891 Personal history of nicotine dependence: Secondary | ICD-10-CM | POA: Insufficient documentation

## 2010-10-01 DIAGNOSIS — S81819A Laceration without foreign body, unspecified lower leg, initial encounter: Secondary | ICD-10-CM

## 2010-10-01 DIAGNOSIS — W268XXA Contact with other sharp object(s), not elsewhere classified, initial encounter: Secondary | ICD-10-CM | POA: Insufficient documentation

## 2010-10-01 DIAGNOSIS — S81009A Unspecified open wound, unspecified knee, initial encounter: Secondary | ICD-10-CM | POA: Insufficient documentation

## 2010-10-01 MED ORDER — CEPHALEXIN 500 MG PO CAPS: ORAL_CAPSULE | ORAL | Status: DC

## 2010-10-01 MED ORDER — CEPHALEXIN 500 MG PO CAPS
500.0000 mg | ORAL_CAPSULE | Freq: Once | ORAL | Status: AC
  Administered 2010-10-01: 500 mg via ORAL
  Filled 2010-10-01: qty 1

## 2010-10-01 MED ORDER — TETANUS-DIPHTH-ACELL PERTUSSIS 5-2.5-18.5 LF-MCG/0.5 IM SUSP
0.5000 mL | Freq: Once | INTRAMUSCULAR | Status: AC
  Administered 2010-10-01: 0.5 mL via INTRAMUSCULAR
  Filled 2010-10-01: qty 0.5

## 2010-10-01 NOTE — ED Provider Notes (Signed)
History     CSN: [id number] Arrival date & time: 10/01/2010  2:58 PM   Chief Complaint  Patient presents with  . Laceration     (Include location/radiation/quality/duration/timing/severity/associated sxs/prior treatment) HPI Comments: Pt was getting into a car when the door accidentally scrapped the left shin. She is unsure of the last tetanus shot. [facility]  Patient is a 77 y.o. female presenting with skin laceration. The history is provided by the patient and a friend.  Laceration  The incident occurred less than 1 hour ago. The laceration is located on the left leg. The laceration is 3 cm in size. The laceration mechanism was a a metal edge. The pain is mild. Her tetanus status is out of date.     Past Medical History  Diagnosis Date  . Hypertension   . Arthritis   . Atrial fibrillation      Past Surgical History  Procedure Date  . Cholecystectomy     History reviewed. No pertinent family history.  History  Substance Use Topics  . Smoking status: Former Games developer  . Smokeless tobacco: Not on file  . Alcohol Use: No    OB History    Grav Para Term Preterm Abortions TAB SAB Ect Mult Living                  Review of Systems  Constitutional: Negative for activity change.       All ROS Neg except as noted in HPI  HENT: Negative for nosebleeds and neck pain.   Eyes: Negative for photophobia and discharge.  Respiratory: Negative for cough, shortness of breath and wheezing.   Cardiovascular: Negative for chest pain and palpitations.  Gastrointestinal: Negative for abdominal pain and blood in stool.  Genitourinary: Negative for dysuria, frequency and hematuria.  Musculoskeletal: Negative for back pain and arthralgias.  Skin: Negative.   Neurological: Negative for dizziness, seizures and speech difficulty.  Psychiatric/Behavioral: Negative for hallucinations and confusion.    Allergies  Amoxicillin  Home Medications    No current outpatient prescriptions on file.  Physical Exam    BP 113/51  Pulse 68  Temp(Src) 98.3 F (36.8 C) (Oral)  Resp 18  Ht 5\' 1"  (1.549 m)  Wt 183 lb (83.008 kg)  BMI 34.58 kg/m2  SpO2 97%  Physical Exam  Nursing note and vitals reviewed. Constitutional: She is oriented to person, place, and time. She appears well-developed and well-nourished.  Non-toxic appearance.  HENT:  Head: Normocephalic.  Right Ear: Tympanic membrane and external ear normal.  Left Ear: Tympanic membrane and external ear normal.  Eyes: EOM and lids are normal. Pupils are equal, round, and reactive to light.  Neck: Normal range of motion. Neck supple. Carotid bruit is not present.  Cardiovascular: Normal rate, regular rhythm, intact distal pulses and normal pulses.   Murmur heard. Pulmonary/Chest: Breath sounds normal. No respiratory distress.  Abdominal: Soft. Bowel sounds are normal. There is no tenderness. There is no guarding.  Musculoskeletal: Normal range of motion.       3.2 cm laceration, skin tear of the lateral left lower leg.  Lymphadenopathy:       Head (right side): No submandibular adenopathy present.       Head (left side): No submandibular adenopathy present.    She has no cervical adenopathy.  Neurological: She is alert and oriented to person, place, and time. She has normal strength. No cranial nerve deficit or sensory deficit.  Skin: Skin is  warm and dry.  Psychiatric: She has a normal mood and affect. Her speech is normal.    ED Course  LACERATION REPAIR Date/Time: 10/01/2010 3:34 PM Performed by: Kathie Dike Authorized by: Donnetta Hutching Consent: Verbal consent obtained. Risks and benefits: risks, benefits and alternatives were discussed Consent given by: patient Patient understanding: patient states understanding of the procedure being performed Patient identity confirmed: verbally with patient Time out: Immediately prior to procedure a "time out" was called to  verify the correct patient, procedure, equipment, support staff and site/side marked as required. Body area: lower extremity Location details: left lower leg Laceration length: 3.2 cm Tendon involvement: none Nerve involvement: none Vascular damage: yes Patient sedated: no Preparation: Patient was prepped and draped in the usual sterile fashion. Irrigation solution: saline Amount of cleaning: standard Debridement: none Degree of undermining: none Skin closure: glue Approximation: loose Dressing: 4x4 sterile gauze and tube gauze Comments: Sterile dressing applied by me.    Results for orders placed in visit on 12/16/09  CONVERTED [facility] LAB      Component Value Range   Sodium 139  135-145 (meq/L)   Potassium 3.8  3.5-5.3 (meq/L)   Chloride 98  96-112 (meq/L)   CO2 29  19-32 (meq/L)   Glucose, Bld 94  70-99 (mg/dL)   BUN 20  9-62 (mg/dL)   Creatinine, Ser 9.52 (*) 0.40-1.20 (mg/dL)   Calcium 9.5  8.4-13.2 (mg/dL)   N.   Dx: Laceration of the left lower leg  MDM I have reviewed nursing notes, vital signs, and all appropriate lab and imaging results for this patient.       Kathie Dike, [place] 10/01/10 1538

## 2010-10-01 NOTE — ED Provider Notes (Signed)
Medical screening examination/treatment/procedure(s) were performed by non-physician practitioner and as supervising physician I was immediately available for consultation/collaboration.  Donnetta Hutching, MD 10/01/10 1950

## 2010-10-01 NOTE — ED Notes (Signed)
Pt has lac to lle. Pt states she cut her leg with her car door while trying to close it.

## 2010-10-03 ENCOUNTER — Encounter: Payer: Self-pay | Admitting: Cardiology

## 2010-10-04 ENCOUNTER — Ambulatory Visit (INDEPENDENT_AMBULATORY_CARE_PROVIDER_SITE_OTHER): Payer: Medicare Other | Admitting: Cardiology

## 2010-10-04 ENCOUNTER — Encounter: Payer: Self-pay | Admitting: Cardiology

## 2010-10-04 DIAGNOSIS — I10 Essential (primary) hypertension: Secondary | ICD-10-CM

## 2010-10-04 DIAGNOSIS — R002 Palpitations: Secondary | ICD-10-CM

## 2010-10-04 NOTE — Assessment & Plan Note (Signed)
Symptomatically well controlled on present regimen. No changes made. Continue observation.

## 2010-10-04 NOTE — Progress Notes (Signed)
Clinical Summary [name] is a 77 y.o.female presenting for followup. She was seen in March of this year.  She reports no progressive palpitations. She has had a few episodes but has not required any medication adjustments. No reproducible chest pain or shortness of breath with activity. No dizziness or syncope.   Allergies  Allergen Reactions  . Amoxicillin Rash    Medication list reviewed.  Past Medical History  Diagnosis Date  . Essential hypertension, benign   . Palpitations   . Lumbar disc disease   . Osteoporosis   . GERD (gastroesophageal reflux disease)   . Anxiety   . History of DVT (deep vein thrombosis)   . Renal insufficiency   . Macular degeneration   . Uterine prolapse     Past Surgical History  Procedure Date  . Cholecystectomy   . Knee arthroscopy     Right  . Cervical polypectomy     Excision of interest cervical/ endometrial lesion  . Finger ganglion cyst excision     Mucoid cyst, left middle finger  . Vitrectomy     Partial, left eye    No family history on file.  Social History [name] reports that she has quit smoking. Her smoking use included Cigarettes. She has never used smokeless tobacco. [name] reports that she does not drink alcohol.  Review of Systems As outlined above, otherwise negative.  Physical Examination Filed Vitals:   10/04/10 1440  BP: 114/52  Pulse: 65  Resp: 18   Overweight elderly woman in no acute distress.  HEENT: Conjunctiva and lids normal, oropharynx with moist mucosa.  Neck: Supple, no elevated JVP or loud bruits, no thyromegaly.  Lungs: Clear to auscultation, nonlabored.  Cardiac: Regular rate and rhythm, no pathologic systolic murmur or S3 gallop.  Abdomen: Soft, nontender, bowel sounds present.  Skin: Warm and dry.  Extremities: No significant pitting edema, trace ankle edema noted. Distal pulses full.  Musculoskeletal: Kyphosis noted.  Neuropsychiatric: Alert and oriented x3, affect  appropriate.   ECG Normal sinus rhythm at 63 with low voltage.  Studies Echocardiogram 02/28/2000: Normal LVEF with borderline LVH, mitral annular calcification with minimal MR, aortic valve sclerosis without stenosis, mild LAE, normal RV size.     Problem List and Plan

## 2010-10-04 NOTE — Assessment & Plan Note (Signed)
Blood pressure is very well controlled today. 

## 2010-10-04 NOTE — Patient Instructions (Signed)
**Note De-identified  Obfuscation** Your physician recommends that you continue on your current medications as directed. Please refer to the Current Medication list given to you today.  Your physician recommends that you schedule a follow-up appointment in: 6 months  

## 2010-10-12 LAB — DIFFERENTIAL
Basophils Relative: 1
Lymphocytes Relative: 24
Monocytes Relative: 10
Neutro Abs: 3.8
Neutrophils Relative %: 60

## 2010-10-12 LAB — CBC
HCT: 33 — ABNORMAL LOW
Platelets: 251
WBC: 6.3

## 2010-10-12 LAB — URINALYSIS, ROUTINE W REFLEX MICROSCOPIC
Glucose, UA: NEGATIVE
Protein, ur: NEGATIVE
Urobilinogen, UA: 0.2

## 2010-10-12 LAB — URINE CULTURE

## 2010-10-12 LAB — CK TOTAL AND CKMB (NOT AT ARMC): Relative Index: INVALID

## 2010-10-12 LAB — BASIC METABOLIC PANEL
BUN: 20
Creatinine, Ser: 1.33 — ABNORMAL HIGH
GFR calc Af Amer: 46 — ABNORMAL LOW
GFR calc non Af Amer: 38 — ABNORMAL LOW
Potassium: 3.6

## 2010-10-12 LAB — URINE MICROSCOPIC-ADD ON

## 2010-10-13 LAB — HEMOGLOBIN AND HEMATOCRIT, BLOOD: Hemoglobin: 12.3

## 2010-10-13 LAB — BASIC METABOLIC PANEL
GFR calc non Af Amer: 40 — ABNORMAL LOW
Potassium: 4
Sodium: 138

## 2010-10-14 LAB — CBC
Hemoglobin: 11.8 — ABNORMAL LOW
RBC: 3.79 — ABNORMAL LOW
RDW: 13.6

## 2010-10-14 LAB — PROTIME-INR: INR: 1

## 2010-10-14 LAB — BASIC METABOLIC PANEL
Calcium: 9.2
GFR calc Af Amer: 53 — ABNORMAL LOW
GFR calc non Af Amer: 44 — ABNORMAL LOW
Sodium: 139

## 2010-10-14 LAB — APTT: aPTT: 28

## 2010-10-31 ENCOUNTER — Encounter (HOSPITAL_COMMUNITY): Payer: Self-pay

## 2010-10-31 ENCOUNTER — Encounter (HOSPITAL_COMMUNITY): Payer: Medicare Other | Attending: Internal Medicine

## 2010-10-31 DIAGNOSIS — M818 Other osteoporosis without current pathological fracture: Secondary | ICD-10-CM | POA: Insufficient documentation

## 2010-10-31 MED ORDER — IBANDRONATE SODIUM 3 MG/3ML IV SOLN
INTRAVENOUS | Status: AC
  Administered 2010-10-31: 3 mg via INTRAVENOUS
  Filled 2010-10-31: qty 3

## 2010-10-31 MED ORDER — IBANDRONATE SODIUM 3 MG/3ML IV SOLN
3.0000 mg | Freq: Once | INTRAVENOUS | Status: AC
  Administered 2010-10-31: 3 mg via INTRAVENOUS

## 2010-10-31 NOTE — Progress Notes (Signed)
Tolerated inj well.

## 2010-11-29 ENCOUNTER — Emergency Department (HOSPITAL_COMMUNITY)
Admission: EM | Admit: 2010-11-29 | Discharge: 2010-11-29 | Disposition: A | Discharge status: Home / Self Care | Payer: Medicare Other | Attending: Emergency Medicine | Admitting: Emergency Medicine

## 2010-11-29 ENCOUNTER — Emergency Department (HOSPITAL_COMMUNITY): Payer: Medicare Other

## 2010-11-29 ENCOUNTER — Encounter (HOSPITAL_COMMUNITY): Payer: Self-pay | Admitting: *Deleted

## 2010-11-29 DIAGNOSIS — I10 Essential (primary) hypertension: Secondary | ICD-10-CM | POA: Insufficient documentation

## 2010-11-29 DIAGNOSIS — N289 Disorder of kidney and ureter, unspecified: Secondary | ICD-10-CM | POA: Insufficient documentation

## 2010-11-29 DIAGNOSIS — Z86718 Personal history of other venous thrombosis and embolism: Secondary | ICD-10-CM | POA: Insufficient documentation

## 2010-11-29 DIAGNOSIS — Z7982 Long term (current) use of aspirin: Secondary | ICD-10-CM | POA: Insufficient documentation

## 2010-11-29 DIAGNOSIS — M519 Unspecified thoracic, thoracolumbar and lumbosacral intervertebral disc disorder: Secondary | ICD-10-CM | POA: Insufficient documentation

## 2010-11-29 DIAGNOSIS — S20219A Contusion of unspecified front wall of thorax, initial encounter: Secondary | ICD-10-CM | POA: Insufficient documentation

## 2010-11-29 DIAGNOSIS — Z87891 Personal history of nicotine dependence: Secondary | ICD-10-CM | POA: Insufficient documentation

## 2010-11-29 DIAGNOSIS — W108XXA Fall (on) (from) other stairs and steps, initial encounter: Secondary | ICD-10-CM | POA: Insufficient documentation

## 2010-11-29 DIAGNOSIS — M810 Age-related osteoporosis without current pathological fracture: Secondary | ICD-10-CM | POA: Insufficient documentation

## 2010-11-29 DIAGNOSIS — R079 Chest pain, unspecified: Secondary | ICD-10-CM | POA: Insufficient documentation

## 2010-11-29 DIAGNOSIS — K219 Gastro-esophageal reflux disease without esophagitis: Secondary | ICD-10-CM | POA: Insufficient documentation

## 2010-11-29 DIAGNOSIS — N814 Uterovaginal prolapse, unspecified: Secondary | ICD-10-CM | POA: Insufficient documentation

## 2010-11-29 DIAGNOSIS — H3530 Unspecified macular degeneration: Secondary | ICD-10-CM | POA: Insufficient documentation

## 2010-11-29 NOTE — ED Notes (Signed)
Pt c/o pain to right rib cage with deep breath and cough.  States has had productive cough since yesterday.  Nad noted.

## 2010-11-29 NOTE — ED Notes (Signed)
Pt requesting to take home Rx cough medication for cough, edp notified and request granted.  Nad noted at this time.  edp to bedside and removed c-collar.

## 2010-11-29 NOTE — ED Notes (Signed)
Pt states lost her balance walking up steps falling backward, landing on back.  Denies hitting head, denies loc, denies pain.  Pt on lsb with c-collar, alert and oriented x 4.

## 2010-11-29 NOTE — ED Provider Notes (Signed)
History    Scribed for Kristin Lennert, MD, the patient was seen in room APA06/[facility]06. This chart was scribed by Katha Cabal.   CSN: [id number] Arrival date & time: 11/29/2010  2:34 PM   First MD Initiated Contact with Patient 11/29/10 1511      Chief Complaint  Patient presents with  . fall/lsb     (Consider location/radiation/quality/duration/timing/severity/associated sxs/prior treatment) Patient is a 77 y.o. female presenting with fall.  Fall The accident occurred less than 1 hour ago. The fall occurred while walking. She fell from a height of 1 to 2 ft. She landed on concrete. There was no blood loss. Pain location: right lateral chest wall. The pain is moderate. She was not ambulatory at the scene. Pertinent negatives include no abdominal pain, no hematuria, no headaches and no loss of consciousness. Exacerbated by: coughing. Treatment on scene includes a c-collar and a backboard.   Patient was walking up steps and fell backwards landing on concrete floor.  Fall was witnessed by friends.  Patient denies hitting head and neck pain.  There was no loss of consciousness.  Patient complains of right chest wall pain that is worse while coughing.     Past Medical History  Diagnosis Date  . Essential hypertension, benign   . Palpitations   . Lumbar disc disease   . Osteoporosis   . GERD (gastroesophageal reflux disease)   . Anxiety   . History of DVT (deep vein thrombosis)   . Renal insufficiency   . Macular degeneration   . Uterine prolapse   . FH: osteoporosis 10/31/2010    Past Surgical History  Procedure Date  . Cholecystectomy   . Knee arthroscopy     Right  . Cervical polypectomy     Excision of interest cervical/ endometrial lesion  . Finger ganglion cyst excision     Mucoid cyst, left middle finger  . Vitrectomy     Partial, left eye    No family history on file.  History  Substance Use Topics  . Smoking status: Former Smoker    Types: Cigarettes  .  Smokeless tobacco: Never Used  . Alcohol Use: No    OB History    Grav Para Term Preterm Abortions TAB SAB Ect Mult Living                  Review of Systems  Constitutional: Negative for fatigue.  HENT: Negative for congestion, neck pain, sinus pressure and ear discharge.   Eyes: Negative for discharge.  Respiratory: Negative for cough.   Cardiovascular: Negative for chest pain.  Gastrointestinal: Negative for abdominal pain and diarrhea.  Genitourinary: Negative for frequency and hematuria.  Musculoskeletal: Negative for back pain.  Skin: Negative for rash.  Neurological: Negative for seizures, loss of consciousness and headaches.  Hematological: Negative.   Psychiatric/Behavioral: Negative for hallucinations.    Allergies  Amoxicillin  Home Medications   Current Outpatient Rx  Name Route Sig Dispense Refill  . ASPIRIN 81 MG PO TABS Oral Take 81 mg by mouth daily.      Marland Kitchen VITAMIN D3 1000 UNITS PO CAPS Oral Take 1 capsule by mouth daily.      Marland Kitchen DILTIAZEM HCL 30 MG PO TABS Oral Take 30 mg by mouth 3 (three) times daily.     Marland Kitchen DOCUSATE SODIUM 100 MG PO CAPS Oral Take 100 mg by mouth 3 (three) times daily.      . IBANDRONATE SODIUM 150 MG PO TABS Oral Take 150  mg by mouth every 3 (three) months. Take in the morning with a full glass of water, on an empty stomach, and do not take anything else by mouth or lie down for the next 30 min.     Marland Kitchen METOPROLOL SUCCINATE 100 MG PO TB24 Oral Take 100 mg by mouth daily.      . ICAPS PO CAPS Oral Take 1 capsule by mouth daily.      Marland Kitchen PANTOPRAZOLE SODIUM 40 MG PO TBEC Oral Take 40 mg by mouth daily.      Marland Kitchen RAMIPRIL 10 MG PO CAPS Oral Take 10 mg by mouth daily.        BP 154/60  Pulse 71  Temp(Src) 98.1 F (36.7 C) (Oral)  Resp 20  Ht 5\' 2"  (1.575 m)  Wt 180 lb (81.647 kg)  BMI 32.92 kg/m2  SpO2 92%  Physical Exam  Constitutional: She is oriented to person, place, and time. She appears well-developed. She does not appear ill. No  distress. Cervical collar in place.  HENT:  Head: Normocephalic and atraumatic.  Eyes: Conjunctivae and EOM are normal. No scleral icterus.  Neck: Neck supple. No spinous process tenderness present. No tracheal deviation present. No thyromegaly present.  Cardiovascular: Normal rate and regular rhythm.  Exam reveals no gallop and no friction rub.   No murmur heard. Pulmonary/Chest: No stridor. She has no wheezes. She has no rales. She exhibits tenderness.       Right later upper chest tenderness   Abdominal: She exhibits no distension. There is no tenderness. There is no rebound.  Musculoskeletal: Normal range of motion. She exhibits no edema.  Lymphadenopathy:    She has no cervical adenopathy.  Neurological: She is alert and oriented to person, place, and time. Coordination normal.  Skin: Skin is intact. No rash noted. No erythema.  Psychiatric: She has a normal mood and affect. Her behavior is normal.    ED Course  Procedures (including critical care time)   DIAGNOSTIC STUDIES: Oxygen Saturation is 92% on room air, adequate by my interpretation.    COORDINATION OF CARE:  3:25 PM  Physical exam complete.   4:19 PM  Reviewed CXR.  4:22 PM  Discussed radiological findings with patient.  Plan to discharge patient home.  Patient agrees with plan.   Orders Placed This Encounter  Procedures  . DG Ribs Unilateral W/Chest Right     LABS / RADIOLOGY:    Labs Reviewed - No data to display Dg Ribs Unilateral W/chest Right  11/29/2010  *RADIOLOGY REPORT*  Clinical Data: Larey Seat, now with right rib pain, hurts when coughing  RIGHT RIBS AND CHEST - 3+ VIEW  Comparison: Chest x-ray of 05/04/2009  Findings: No active infiltrate or effusion is seen.  Mild bibasilar linear scarring or atelectasis is noted.  The heart is mildly enlarged.  There is a slight thoracolumbar scoliosis and the bones are diffusely osteopenic.  No acute rib fracture is seen.  IMPRESSION:  1.  Mild bibasilar linear  atelectasis or scarring.  No active process. 2.  Cardiomegaly. 3.  Osteopenia.  No acute rib fracture.  Original Report Authenticated By: Juline Patch, M.D.         MDM      MEDICATIONS GIVEN IN THE E.D. Scheduled Meds:   Continuous Infusions:       IMPRESSION: 1. Chest wall contusion      DISCHARGE MEDICATIONS: New Prescriptions   No medications on file      The chart  was scribed for me under my direct supervision.  I personally performed the history, physical, and medical decision making and all procedures in the evaluation of this patient.Kristin Lennert, MD 11/29/10 [id number]

## 2010-12-12 ENCOUNTER — Other Ambulatory Visit (HOSPITAL_COMMUNITY): Payer: Self-pay | Admitting: Internal Medicine

## 2010-12-12 ENCOUNTER — Ambulatory Visit (HOSPITAL_COMMUNITY)
Admission: RE | Admit: 2010-12-12 | Discharge: 2010-12-12 | Disposition: A | Discharge status: Home / Self Care | Payer: Medicare Other | Source: Ambulatory Visit | Attending: Internal Medicine | Admitting: Internal Medicine

## 2010-12-12 DIAGNOSIS — M545 Low back pain, unspecified: Secondary | ICD-10-CM | POA: Insufficient documentation

## 2010-12-12 DIAGNOSIS — J40 Bronchitis, not specified as acute or chronic: Secondary | ICD-10-CM

## 2010-12-12 DIAGNOSIS — M51379 Other intervertebral disc degeneration, lumbosacral region without mention of lumbar back pain or lower extremity pain: Secondary | ICD-10-CM | POA: Insufficient documentation

## 2010-12-12 DIAGNOSIS — M899 Disorder of bone, unspecified: Secondary | ICD-10-CM | POA: Insufficient documentation

## 2010-12-12 DIAGNOSIS — M5137 Other intervertebral disc degeneration, lumbosacral region: Secondary | ICD-10-CM | POA: Insufficient documentation

## 2011-01-19 ENCOUNTER — Other Ambulatory Visit (HOSPITAL_COMMUNITY): Payer: Self-pay | Admitting: Internal Medicine

## 2011-01-19 DIAGNOSIS — R634 Abnormal weight loss: Secondary | ICD-10-CM

## 2011-01-24 ENCOUNTER — Ambulatory Visit (HOSPITAL_COMMUNITY)
Admission: RE | Admit: 2011-01-24 | Discharge: 2011-01-24 | Disposition: A | Discharge status: Home / Self Care | Payer: Medicare Other | Source: Ambulatory Visit | Attending: Internal Medicine | Admitting: Internal Medicine

## 2011-01-24 DIAGNOSIS — R110 Nausea: Secondary | ICD-10-CM | POA: Insufficient documentation

## 2011-01-24 DIAGNOSIS — R634 Abnormal weight loss: Secondary | ICD-10-CM | POA: Insufficient documentation

## 2011-02-20 ENCOUNTER — Encounter (HOSPITAL_COMMUNITY): Payer: Medicare Other | Attending: Internal Medicine

## 2011-02-20 DIAGNOSIS — M818 Other osteoporosis without current pathological fracture: Secondary | ICD-10-CM | POA: Insufficient documentation

## 2011-02-20 MED ORDER — IBANDRONATE SODIUM 3 MG/3ML IV SOLN
3.0000 mg | Freq: Once | INTRAVENOUS | Status: AC
  Administered 2011-02-20: 3 mg via INTRAVENOUS

## 2011-02-20 NOTE — Progress Notes (Signed)
Boniva 3mg  given IV push via 23 gauge butterfly right antecubital. Pt tolerated well.

## 2011-04-03 ENCOUNTER — Encounter: Payer: Self-pay | Admitting: Cardiology

## 2011-04-03 ENCOUNTER — Ambulatory Visit (INDEPENDENT_AMBULATORY_CARE_PROVIDER_SITE_OTHER): Payer: Medicare Other | Admitting: Cardiology

## 2011-04-03 VITALS — BP 121/58 | HR 70 | Resp 16 | Ht 61.0 in | Wt 170.0 lb

## 2011-04-03 DIAGNOSIS — R002 Palpitations: Secondary | ICD-10-CM

## 2011-04-03 DIAGNOSIS — I10 Essential (primary) hypertension: Secondary | ICD-10-CM

## 2011-04-03 NOTE — Patient Instructions (Signed)
**Note De-identified  Obfuscation** Your physician recommends that you continue on your current medications as directed. Please refer to the Current Medication list given to you today.  Your physician recommends that you schedule a follow-up appointment in: 6 months  

## 2011-04-03 NOTE — Assessment & Plan Note (Signed)
No progression or need for PRN diltiazem. Continues on Toprol XL. No changes made today.

## 2011-04-03 NOTE — Assessment & Plan Note (Signed)
Blood pressure control is good today. 

## 2011-04-03 NOTE — Progress Notes (Signed)
   Clinical Summary Kristin Rubio is a 78 y.o.female presenting for followup. She was in September 2012. She reports a recent physical with Dr. Ouida Sills earlier this year. No progression palpitations, and has not needed to use any PRN diltiazem.   She is still getting around with a walker, reports no falls. She is tolerating her current medications.   Allergies  Allergen Reactions  . Amoxicillin Rash    Current Outpatient Prescriptions  Medication Sig Dispense Refill  . aspirin 81 MG tablet Take 81 mg by mouth daily.        . citalopram (CELEXA) 10 MG tablet Take 10 mg by mouth daily.      Marland Kitchen diltiazem (CARDIZEM) 30 MG tablet Take 30 mg by mouth 3 (three) times daily.       Marland Kitchen docusate sodium (COLACE) 100 MG capsule Take 100 mg by mouth 3 (three) times daily.        . hydrochlorothiazide (MICROZIDE) 12.5 MG capsule Take 12.5 mg by mouth daily.      Marland Kitchen ibandronate (BONIVA) 150 MG tablet Take 150 mg by mouth every 3 (three) months. Take in the morning with a full glass of water, on an empty stomach, and do not take anything else by mouth or lie down for the next 30 min.       . metoprolol (TOPROL-XL) 100 MG 24 hr tablet Take 100 mg by mouth daily.        . Multiple Vitamins-Minerals (ICAPS) CAPS Take 1 capsule by mouth daily.        . pantoprazole (PROTONIX) 40 MG tablet Take 40 mg by mouth daily.        . ramipril (ALTACE) 10 MG capsule Take 10 mg by mouth daily.          Past Medical History  Diagnosis Date  . Essential hypertension, benign   . Palpitations   . Lumbar disc disease   . Osteoporosis   . GERD (gastroesophageal reflux disease)   . Anxiety   . History of DVT (deep vein thrombosis)   . Renal insufficiency   . Macular degeneration   . Uterine prolapse   . FH: osteoporosis 10/31/2010    Social History Kristin Rubio reports that she has quit smoking. Her smoking use included Cigarettes. She has never used smokeless tobacco. Ms. Arizpe reports that she does not drink  alcohol.  Review of Systems Stable appetite, no bleeding problems. Otherwise negative.  Physical Examination Filed Vitals:   04/03/11 1305  BP: 121/58  Pulse: 70  Resp: 16    Overweight elderly woman in no acute distress.  HEENT: Conjunctiva and lids normal, oropharynx with moist mucosa.  Neck: Supple, no elevated JVP or loud bruits, no thyromegaly.  Lungs: Clear to auscultation, nonlabored.  Cardiac: Regular rate and rhythm, no pathologic systolic murmur or S3 gallop.  Abdomen: Soft, nontender, bowel sounds present.  Skin: Warm and dry.  Extremities: No significant pitting edema, trace ankle edema noted.    Problem List and Plan

## 2011-04-28 ENCOUNTER — Encounter: Payer: Self-pay | Admitting: Adult Health

## 2011-04-28 ENCOUNTER — Other Ambulatory Visit: Payer: Self-pay

## 2011-04-28 ENCOUNTER — Ambulatory Visit (INDEPENDENT_AMBULATORY_CARE_PROVIDER_SITE_OTHER): Payer: Medicare Other | Admitting: Adult Health

## 2011-04-28 VITALS — BP 133/63 | HR 66 | Resp 16 | Ht 61.0 in | Wt 168.0 lb

## 2011-04-28 DIAGNOSIS — I10 Essential (primary) hypertension: Secondary | ICD-10-CM

## 2011-04-28 DIAGNOSIS — R002 Palpitations: Secondary | ICD-10-CM

## 2011-04-28 MED ORDER — DILTIAZEM HCL ER 60 MG PO CP12: 60.0000 mg | ORAL_CAPSULE | Freq: Two times a day (BID) | ORAL | Status: DC

## 2011-04-28 MED ORDER — RAMIPRIL 5 MG PO CAPS: 5.0000 mg | ORAL_CAPSULE | Freq: Every day | ORAL | Status: DC

## 2011-04-28 NOTE — Assessment & Plan Note (Signed)
As above.

## 2011-04-28 NOTE — Patient Instructions (Signed)
**Note De-Identified  Obfuscation** Your physician has recommended you make the following change in your medication: Change Diltiazem 30 mg to Diltiazem ER 60 mg twice daily and decrease Ramipril to 5 mg daily  We will contact you next week to check your condition  Your physician recommends that you schedule a follow-up appointment in: 1 month

## 2011-04-28 NOTE — Progress Notes (Signed)
HPI: Kristin Rubio is a 78 y/o patietn of Dr. Diona Browner we are seeing for ongoing assessment of palpitations and hypertension. She was just seen by Dr. Diona Browner 3 weeks ago and was doing well. She states that her palpitations have begun to get worse.  She is feeling them early in the am and also late at night. She is currently on diltiazem 30 mg TID with instructions to take additional dose for palpitations. She did not do this and instead wanted to be seen again,. She is also on metoprolol 100 mg daily and ramapril 10 mg daily. She denies use of caffeine.  She has no chest pain or dizziness associated with the palpitations,.  Allergies  Allergen Reactions  . Amoxicillin Rash    Current Outpatient Prescriptions  Medication Sig Dispense Refill  . aspirin 81 MG tablet Take 81 mg by mouth daily.        . citalopram (CELEXA) 10 MG tablet Take 10 mg by mouth daily.      Marland Kitchen docusate sodium (COLACE) 100 MG capsule Take 100 mg by mouth 3 (three) times daily.        . hydrochlorothiazide (MICROZIDE) 12.5 MG capsule Take 12.5 mg by mouth daily.      Marland Kitchen ibandronate (BONIVA) 150 MG tablet Take 150 mg by mouth every 3 (three) months. Take in the morning with a full glass of water, on an empty stomach, and do not take anything else by mouth or lie down for the next 30 min.       . metoprolol (TOPROL-XL) 100 MG 24 hr tablet Take 100 mg by mouth daily.        . Multiple Vitamins-Minerals (ICAPS) CAPS Take 1 capsule by mouth daily.        . pantoprazole (PROTONIX) 40 MG tablet Take 40 mg by mouth daily.        Marland Kitchen DISCONTD: ramipril (ALTACE) 10 MG capsule Take 10 mg by mouth daily.        Marland Kitchen DISCONTD: ramipril (ALTACE) 5 MG capsule Take 1 capsule (5 mg total) by mouth daily.  30 capsule  3  . DISCONTD: ramipril (ALTACE) 5 MG capsule Take 1 capsule (5 mg total) by mouth daily.  30 capsule  3  . diltiazem (CARDIZEM SR) 60 MG 12 hr capsule Take 1 capsule (60 mg total) by mouth 2 (two) times daily.  60 capsule  3  .  ramipril (ALTACE) 5 MG capsule Take 1 capsule (5 mg total) by mouth daily.  30 capsule  3    Past Medical History  Diagnosis Date  . Essential hypertension, benign   . Palpitations   . Lumbar disc disease   . Osteoporosis   . GERD (gastroesophageal reflux disease)   . Anxiety   . History of DVT (deep vein thrombosis)   . Renal insufficiency   . Macular degeneration   . Uterine prolapse   . FH: osteoporosis 10/31/2010    Past Surgical History  Procedure Date  . Cholecystectomy   . Knee arthroscopy     Right  . Cervical polypectomy     Excision of interest cervical/ endometrial lesion  . Finger ganglion cyst excision     Mucoid cyst, left middle finger  . Vitrectomy     Partial, left eye    BZJ:IRCVEL of systems complete and found to be negative unless listed above PHYSICAL EXAM BP 133/63  Pulse 66  Resp 16  Ht 5\' 1"  (1.549 m)  Wt 168 lb (  76.204 kg)  BMI 31.74 kg/m2 General: Well developed, well nourished, in no acute distress Head: Eyes PERRLA, No xanthomas.   Normal cephalic and atramatic  Lungs: Clear bilaterally to auscultation and percussion. Heart: HRRR S1 S2, without MRG.  Pulses are 2+ & equal.            No carotid bruit. No JVD.  No abdominal bruits. No femoral bruits. Neuro: Alert and oriented X 3. Psych:  Good affect, responds appropriately   ASSESSMENT AND PLAN

## 2011-04-28 NOTE — Assessment & Plan Note (Signed)
I have gone over the times she takes the diltiazem doses and they are not equally distributed over the course of the day.I will change her dose to diltiazem SR 60 mg BID. She will take it at 9a and 9p. This should cover late night and early morning complaints of palpitations. Since I am going up on the dose, I will decrease the Altace to 5 mg daily to avoid hypotension.This has been explained to her and she verbalizes understanding. We will call and check on her next week and she will follow-up with Dr. Diona Browner in one month for his assessment of her symptoms and response to medication changes.

## 2011-05-04 ENCOUNTER — Telehealth: Payer: Self-pay

## 2011-05-04 NOTE — Telephone Encounter (Signed)
LMOM. Per Joni Reining, NP, calling to check on pt after med changes made at appt. on 4-12./LV

## 2011-05-05 NOTE — Telephone Encounter (Signed)
Pt. states that she felt some palpitations when she first started new meds at the beginning of week but that she has not noticed any since. Pt. Is advised to call office if s/s reoccur, she verbalized understanding./LV

## 2011-05-08 ENCOUNTER — Telehealth: Payer: Self-pay | Admitting: Cardiology

## 2011-05-08 NOTE — Telephone Encounter (Signed)
Patient states she is calling to "report something" to you. / tg

## 2011-05-08 NOTE — Telephone Encounter (Signed)
**Note De-Identified  Obfuscation** Pt. states that she started noticing palpitations over the weekend and again this morning and wanted [place] to be aware. She is scheduled to see Dr. Diona Browner on 5-15. She wants to if there is anything she can do. Please advise./LV  Also, see phone note from 4-18 (F/u call checking on pt's condition from [facility] on 4-12 with Harriet Pho, NP).

## 2011-05-08 NOTE — Telephone Encounter (Signed)
She is to continue to take her cardizem as directed BID. No changes unless she becomes very symptomatic. Anxiety is playing a roll in this. She will keep her appointment with Dr. Diona Browner at scheduled time.

## 2011-05-09 NOTE — Telephone Encounter (Signed)
**Note De-identified  Obfuscation** Pt. advised, she verbalized understanding./LV 

## 2011-05-19 ENCOUNTER — Ambulatory Visit (HOSPITAL_COMMUNITY): Payer: Medicare Other

## 2011-05-31 ENCOUNTER — Encounter: Payer: Self-pay | Admitting: Cardiology

## 2011-05-31 ENCOUNTER — Ambulatory Visit (INDEPENDENT_AMBULATORY_CARE_PROVIDER_SITE_OTHER): Payer: Medicare Other | Admitting: Cardiology

## 2011-05-31 VITALS — BP 122/63 | HR 63 | Ht 61.0 in | Wt 162.0 lb

## 2011-05-31 DIAGNOSIS — I10 Essential (primary) hypertension: Secondary | ICD-10-CM

## 2011-05-31 DIAGNOSIS — R002 Palpitations: Secondary | ICD-10-CM

## 2011-05-31 NOTE — Assessment & Plan Note (Signed)
Blood pressure looks good today. No changes made. 

## 2011-05-31 NOTE — Assessment & Plan Note (Signed)
Generally better although not resolved. Continue current regimen and follow up in one month.

## 2011-05-31 NOTE — Progress Notes (Signed)
   Clinical Summary Ms. Valladares is a 78 y.o.female presenting to the office for followup. She was recently seen by Ms. Lawrence due to progressive palpitations and underwent medication adjustments. She reports general improvement since that time, however not complete resolution of her palpitations. She has had no dizziness or syncope. No chest pain.  She reports compliance with her medical regimen. Blood pressure also looks good, Altace was decreased at the last visit as well.   Allergies  Allergen Reactions  . Amoxicillin Rash    Current Outpatient Prescriptions  Medication Sig Dispense Refill  . aspirin 81 MG tablet Take 81 mg by mouth daily.        . citalopram (CELEXA) 10 MG tablet Take 10 mg by mouth daily.      Marland Kitchen diltiazem (CARDIZEM SR) 60 MG 12 hr capsule Take 1 capsule (60 mg total) by mouth 2 (two) times daily.  60 capsule  3  . docusate sodium (COLACE) 100 MG capsule Take 100 mg by mouth 3 (three) times daily.        . hydrochlorothiazide (MICROZIDE) 12.5 MG capsule Take 12.5 mg by mouth daily.      Marland Kitchen ibandronate (BONIVA) 150 MG tablet Take 150 mg by mouth every 3 (three) months. Take in the morning with a full glass of water, on an empty stomach, and do not take anything else by mouth or lie down for the next 30 min.       . metoprolol (TOPROL-XL) 100 MG 24 hr tablet Take 100 mg by mouth daily.        . Multiple Vitamins-Minerals (ICAPS) CAPS Take 1 capsule by mouth daily.        . pantoprazole (PROTONIX) 40 MG tablet Take 40 mg by mouth daily.        . ramipril (ALTACE) 5 MG capsule Take 1 capsule (5 mg total) by mouth daily.  30 capsule  3    Past Medical History  Diagnosis Date  . Essential hypertension, benign   . Palpitations   . Lumbar disc disease   . Osteoporosis   . GERD (gastroesophageal reflux disease)   . Anxiety   . History of DVT (deep vein thrombosis)   . Renal insufficiency   . Macular degeneration   . Uterine prolapse   . FH: osteoporosis 10/31/2010     Social History Ms. Drummonds reports that she has quit smoking. Her smoking use included Cigarettes. She has never used smokeless tobacco. Ms. Spano reports that she does not drink alcohol.  Review of Systems No falls. Using walker to ambulate. Otherwise reviewed and negative.  Physical Examination Filed Vitals:   05/31/11 1418  BP: 122/63  Pulse: 63   Overweight elderly woman in no acute distress.  HEENT: Conjunctiva and lids normal, oropharynx with moist mucosa.  Neck: Supple, no elevated JVP or loud bruits, no thyromegaly.  Lungs: Clear to auscultation, nonlabored.  Cardiac: Regular rate and rhythm, no pathologic systolic murmur or S3 gallop.  Abdomen: Soft, nontender, bowel sounds present.  Skin: Warm and dry.  Extremities: No significant pitting edema, trace ankle edema noted.   Problem List and Plan   No problem-specific assessment & plan notes found for this encounter.   Jonelle Sidle, M.D., F.A.C.C.

## 2011-05-31 NOTE — Patient Instructions (Signed)
**Note De-identified  Obfuscation** Your physician recommends that you continue on your current medications as directed. Please refer to the Current Medication list given to you today.  Your physician recommends that you schedule a follow-up appointment in: 1 month  

## 2011-06-30 ENCOUNTER — Encounter (HOSPITAL_COMMUNITY): Payer: Medicare Other | Attending: Internal Medicine

## 2011-06-30 VITALS — BP 100/48 | HR 64 | Temp 97.5°F

## 2011-06-30 DIAGNOSIS — I10 Essential (primary) hypertension: Secondary | ICD-10-CM | POA: Insufficient documentation

## 2011-06-30 MED ORDER — SODIUM CHLORIDE 0.9 % IJ SOLN
10.0000 mL | INTRAMUSCULAR | Status: DC | PRN
  Administered 2011-06-30: 10 mL via INTRAVENOUS

## 2011-06-30 MED ORDER — IBANDRONATE SODIUM 3 MG/3ML IV SOLN
3.0000 mg | Freq: Once | INTRAVENOUS | Status: AC
  Administered 2011-06-30: 3 mg via INTRAVENOUS

## 2011-06-30 NOTE — Progress Notes (Signed)
Tolerated Boniva IV well. Flushed with ns.

## 2011-07-18 ENCOUNTER — Encounter: Payer: Self-pay | Admitting: Cardiology

## 2011-07-18 ENCOUNTER — Ambulatory Visit (INDEPENDENT_AMBULATORY_CARE_PROVIDER_SITE_OTHER): Payer: Medicare Other | Admitting: Cardiology

## 2011-07-18 VITALS — BP 115/51 | HR 66 | Ht 61.0 in | Wt 165.0 lb

## 2011-07-18 DIAGNOSIS — I10 Essential (primary) hypertension: Secondary | ICD-10-CM

## 2011-07-18 DIAGNOSIS — R002 Palpitations: Secondary | ICD-10-CM

## 2011-07-18 NOTE — Patient Instructions (Addendum)
Your physician recommends that you schedule a follow-up appointment in: 3 months.  

## 2011-07-18 NOTE — Assessment & Plan Note (Signed)
Blood pressure is well-controlled today. 

## 2011-07-18 NOTE — Assessment & Plan Note (Signed)
No progressive symptoms on current medical regimen. Continue observation. Three-month followup arranged.

## 2011-07-18 NOTE — Progress Notes (Signed)
   Clinical Summary Ms. Baucom is a 78 y.o.female presenting for followup. She was seen in May. She reports one brief episode of palpitations since her last visit, otherwise has been stable with no progressive shortness of breath or chest pain.  She reports compliance with her medications, outlined below.   Allergies  Allergen Reactions  . Amoxicillin Rash    Current Outpatient Prescriptions  Medication Sig Dispense Refill  . aspirin 81 MG tablet Take 81 mg by mouth daily.        . citalopram (CELEXA) 10 MG tablet Take 10 mg by mouth daily.      Marland Kitchen diltiazem (CARDIZEM SR) 60 MG 12 hr capsule Take 1 capsule (60 mg total) by mouth 2 (two) times daily.  60 capsule  3  . docusate sodium (COLACE) 100 MG capsule Take 100 mg by mouth 3 (three) times daily.        . hydrochlorothiazide (MICROZIDE) 12.5 MG capsule Take 12.5 mg by mouth daily.      . Ibandronate Sodium (BONIVA IV) Inject into the vein.      . metoprolol (TOPROL-XL) 100 MG 24 hr tablet Take 100 mg by mouth daily.        . Multiple Vitamins-Minerals (ICAPS) CAPS Take 1 capsule by mouth daily.        . pantoprazole (PROTONIX) 40 MG tablet Take 40 mg by mouth daily.        . ramipril (ALTACE) 5 MG capsule Take 1 capsule (5 mg total) by mouth daily.  30 capsule  3    Past Medical History  Diagnosis Date  . Essential hypertension, benign   . Palpitations   . Lumbar disc disease   . Osteoporosis   . GERD (gastroesophageal reflux disease)   . Anxiety   . History of DVT (deep vein thrombosis)   . Renal insufficiency   . Macular degeneration   . Uterine prolapse   . FH: osteoporosis 10/31/2010    Social History Ms. Weitman reports that she has quit smoking. Her smoking use included Cigarettes. She has never used smokeless tobacco. Ms. Fincher reports that she does not drink alcohol.  Review of Systems Otherwise negative  Physical Examination Filed Vitals:   07/18/11 1311  BP: 115/51  Pulse: 66    Overweight elderly woman  in no acute distress.  HEENT: Conjunctiva and lids normal, oropharynx with moist mucosa.  Neck: Supple, no elevated JVP or loud bruits, no thyromegaly.  Lungs: Clear to auscultation, nonlabored.  Cardiac: Regular rate and rhythm, no pathologic systolic murmur or S3 gallop.  Skin: Warm and dry.  Extremities: No significant pitting edema, trace ankle edema noted.    Problem List and Plan   PALPITATIONS No progressive symptoms on current medical regimen. Continue observation. Three-month followup arranged.  ESSENTIAL HYPERTENSION, BENIGN Blood pressure is well controlled today.    Jonelle Sidle, M.D., F.A.C.C.

## 2011-08-22 ENCOUNTER — Other Ambulatory Visit: Payer: Self-pay | Admitting: Adult Health

## 2011-08-22 ENCOUNTER — Ambulatory Visit (INDEPENDENT_AMBULATORY_CARE_PROVIDER_SITE_OTHER): Payer: Medicare Other | Admitting: *Deleted

## 2011-08-22 VITALS — BP 160/78 | HR 70 | Wt 162.0 lb

## 2011-08-22 DIAGNOSIS — I4891 Unspecified atrial fibrillation: Secondary | ICD-10-CM

## 2011-08-22 DIAGNOSIS — R002 Palpitations: Secondary | ICD-10-CM

## 2011-08-22 NOTE — Progress Notes (Signed)
Patient states that she had palpitations last night that kept her up all night.  EKG performed in office and revealed NSR.  No acute distress noted.  Consulted with Dr Gracy Racer and patient advised to let us know if she continued to have any problems.

## 2011-08-22 NOTE — Progress Notes (Signed)
Remains in sinus rhythm. No change to current regimen. Continue observation.

## 2011-08-25 ENCOUNTER — Other Ambulatory Visit: Payer: Self-pay | Admitting: Adult Health

## 2011-09-29 ENCOUNTER — Encounter (HOSPITAL_COMMUNITY): Payer: Medicare Other | Attending: Internal Medicine

## 2011-09-29 VITALS — BP 110/50 | HR 60 | Temp 97.8°F | Resp 18

## 2011-09-29 DIAGNOSIS — M810 Age-related osteoporosis without current pathological fracture: Secondary | ICD-10-CM

## 2011-09-29 HISTORY — DX: Age-related osteoporosis without current pathological fracture: M81.0

## 2011-09-29 MED ORDER — SODIUM CHLORIDE 0.9 % IJ SOLN
10.0000 mL | INTRAMUSCULAR | Status: DC | PRN
  Administered 2011-09-29: 10 mL via INTRAVENOUS

## 2011-09-29 MED ORDER — IBANDRONATE SODIUM 3 MG/3ML IV SOLN
3.0000 mg | Freq: Once | INTRAVENOUS | Status: AC
  Administered 2011-09-29: 3 mg via INTRAVENOUS

## 2011-09-29 NOTE — Progress Notes (Signed)
IV access established in left antecubital.NS 10 ml given followed by Boniva 3 mg and  Again NS 10 ml. Tolerated well.

## 2011-10-12 ENCOUNTER — Ambulatory Visit: Payer: Medicare Other | Admitting: Cardiology

## 2011-10-13 ENCOUNTER — Encounter: Payer: Self-pay | Admitting: Cardiology

## 2011-10-13 ENCOUNTER — Ambulatory Visit (INDEPENDENT_AMBULATORY_CARE_PROVIDER_SITE_OTHER): Payer: Medicare Other | Admitting: Cardiology

## 2011-10-13 VITALS — BP 94/52 | HR 72 | Ht 61.0 in | Wt 160.1 lb

## 2011-10-13 DIAGNOSIS — I10 Essential (primary) hypertension: Secondary | ICD-10-CM

## 2011-10-13 DIAGNOSIS — R002 Palpitations: Secondary | ICD-10-CM

## 2011-10-13 NOTE — Assessment & Plan Note (Signed)
Blood pressure somewhat low today, but asymptomatic.

## 2011-10-13 NOTE — Progress Notes (Signed)
   Clinical Summary Ms. Laseter is a 78 y.o.female presenting for followup. She was seen in July. She continues to have occasional palpitations, no progression in associated mild chest pain or dyspnea. She reports compliance with her medications.  She has had no falls with her walker. Plays bridge with a group regularly. She states that she continues to do what she needs to do at home without change in limitations.   Allergies  Allergen Reactions  . Amoxicillin Rash    rash    Current Outpatient Prescriptions  Medication Sig Dispense Refill  . aspirin 81 MG tablet Take 81 mg by mouth daily.        . citalopram (CELEXA) 10 MG tablet Take 10 mg by mouth daily.      Marland Kitchen diltiazem (CARDIZEM SR) 60 MG 12 hr capsule TAKE (1) CAPSULE BY MOUTH TWICE DAILY.  60 capsule  2  . docusate sodium (COLACE) 100 MG capsule Take 100 mg by mouth 3 (three) times daily.        . hydrochlorothiazide (MICROZIDE) 12.5 MG capsule Take 12.5 mg by mouth daily.      . Ibandronate Sodium (BONIVA IV) Inject into the vein every 3 (three) months.       . metoprolol (TOPROL-XL) 100 MG 24 hr tablet Take 100 mg by mouth daily.        . Multiple Vitamins-Minerals (ICAPS) CAPS Take 1 capsule by mouth daily.        . pantoprazole (PROTONIX) 40 MG tablet Take 40 mg by mouth daily.        . ramipril (ALTACE) 5 MG capsule TAKE ONE CAPSULE DAILY.  30 capsule  12    Past Medical History  Diagnosis Date  . Essential hypertension, benign   . Palpitations   . Lumbar disc disease   . Osteoporosis   . GERD (gastroesophageal reflux disease)   . Anxiety   . History of DVT (deep vein thrombosis)   . Renal insufficiency   . Macular degeneration   . Uterine prolapse   . FH: osteoporosis 10/31/2010    Social History Ms. Cyran reports that she has quit smoking. Her smoking use included Cigarettes. She has never used smokeless tobacco. Ms. Mohammed reports that she does not drink alcohol.  Review of Systems As outlined above,  otherwise negative.  Physical Examination Filed Vitals:   10/13/11 1329  BP: 94/52  Pulse: 72   Filed Weights   10/13/11 1329  Weight: 160 lb 1.9 oz (72.63 kg)   HEENT: Conjunctiva and lids normal, oropharynx with moist mucosa.  Neck: Supple, no elevated JVP or loud bruits, no thyromegaly.  Lungs: Clear to auscultation, nonlabored.  Cardiac: Regular rate and rhythm, no pathologic systolic murmur or S3 gallop.  Skin: Warm and dry.  Extremities: No significant pitting edema, trace ankle edema noted.    Problem List and Plan   PALPITATIONS Continue observation on medical therapy. No change to current regimen.  ESSENTIAL HYPERTENSION, BENIGN Blood pressure somewhat low today, but asymptomatic.    Jonelle Sidle, M.D., F.A.C.C.

## 2011-10-13 NOTE — Assessment & Plan Note (Signed)
Continue observation on medical therapy. No change to current regimen.

## 2011-12-26 ENCOUNTER — Encounter: Payer: Self-pay | Admitting: Cardiology

## 2011-12-26 ENCOUNTER — Ambulatory Visit (INDEPENDENT_AMBULATORY_CARE_PROVIDER_SITE_OTHER): Payer: Medicare Other | Admitting: Cardiology

## 2011-12-26 VITALS — BP 127/61 | HR 64 | Ht 61.0 in | Wt 160.1 lb

## 2011-12-26 DIAGNOSIS — I10 Essential (primary) hypertension: Secondary | ICD-10-CM

## 2011-12-26 DIAGNOSIS — R002 Palpitations: Secondary | ICD-10-CM

## 2011-12-26 NOTE — Patient Instructions (Addendum)
Your physician recommends that you schedule a follow-up appointment in: 3 MONTHS WITH DR. MCDOWELL  NO CHANGES WERE MADE TODAY

## 2011-12-26 NOTE — Assessment & Plan Note (Signed)
Blood pressure control is good today, no changes made. 

## 2011-12-26 NOTE — Assessment & Plan Note (Signed)
Continue current doses of diltiazem and Toprol-XL.

## 2011-12-26 NOTE — Progress Notes (Signed)
   Clinical Summary Kristin Rubio is a 78 y.o.female presenting for followup. She was seen in July. She reports occasional palpitations, no major change in pattern or frequency. She reports compliance with her medications.   Allergies  Allergen Reactions  . Amoxicillin Rash    rash    Current Outpatient Prescriptions  Medication Sig Dispense Refill  . acetaminophen (TYLENOL ARTHRITIS PAIN) 650 MG CR tablet Take 650 mg by mouth as needed.      Marland Kitchen aspirin 81 MG tablet Take 81 mg by mouth daily.        . citalopram (CELEXA) 10 MG tablet Take 10 mg by mouth daily.      Marland Kitchen diltiazem (CARDIZEM SR) 60 MG 12 hr capsule TAKE (1) CAPSULE BY MOUTH TWICE DAILY.  60 capsule  2  . docusate sodium (COLACE) 100 MG capsule Take 100 mg by mouth 3 (three) times daily.        . hydrochlorothiazide (MICROZIDE) 12.5 MG capsule Take 12.5 mg by mouth daily.      . Ibandronate Sodium (BONIVA IV) Inject into the vein every 3 (three) months.       . metoprolol (TOPROL-XL) 100 MG 24 hr tablet Take 100 mg by mouth daily.        . Multiple Vitamins-Minerals (ICAPS) CAPS Take 1 capsule by mouth daily.        . pantoprazole (PROTONIX) 40 MG tablet Take 40 mg by mouth daily.        . ramipril (ALTACE) 5 MG capsule TAKE ONE CAPSULE DAILY.  30 capsule  12    Past Medical History  Diagnosis Date  . Essential hypertension, benign   . Palpitations   . Lumbar disc disease   . Osteoporosis   . GERD (gastroesophageal reflux disease)   . Anxiety   . History of DVT (deep vein thrombosis)   . Renal insufficiency   . Macular degeneration   . Uterine prolapse   . FH: osteoporosis 10/31/2010    Social History Kristin Rubio reports that she has quit smoking. Her smoking use included Cigarettes. She has never used smokeless tobacco. Kristin Rubio reports that she does not drink alcohol.  Review of Systems Still using a walker. No falls. Stable appetite. No angina symptoms. Otherwise negative.  Physical Examination Filed Vitals:     12/26/11 1450  BP: 127/61  Pulse: 64   Filed Weights   12/26/11 1450  Weight: 160 lb 1.3 oz (72.612 kg)    Overweight elderly woman in no acute distress.  HEENT: Conjunctiva and lids normal, oropharynx with moist mucosa.  Neck: Supple, no elevated JVP or loud bruits, no thyromegaly.  Lungs: Clear to auscultation, nonlabored.  Cardiac: Regular rate and rhythm, no pathologic systolic murmur or S3 gallop.  Skin: Warm and dry.  Extremities: No significant pitting edema, trace ankle edema noted.   Problem List and Plan   PALPITATIONS Continue current doses of diltiazem and Toprol-XL.  ESSENTIAL HYPERTENSION, BENIGN Blood pressure control is good today, no changes made.    Jonelle Sidle, M.D., F.A.C.C.

## 2011-12-27 ENCOUNTER — Other Ambulatory Visit: Payer: Self-pay | Admitting: Cardiology

## 2011-12-28 ENCOUNTER — Encounter (HOSPITAL_COMMUNITY): Payer: Medicare Other | Attending: Internal Medicine

## 2011-12-28 VITALS — BP 102/62 | HR 104 | Resp 16

## 2011-12-28 DIAGNOSIS — M810 Age-related osteoporosis without current pathological fracture: Secondary | ICD-10-CM | POA: Insufficient documentation

## 2011-12-28 MED ORDER — IBANDRONATE SODIUM 3 MG/3ML IV SOLN
3.0000 mg | Freq: Once | INTRAVENOUS | Status: AC
  Administered 2011-12-28: 3 mg via INTRAVENOUS

## 2011-12-28 MED ORDER — SODIUM CHLORIDE 0.9 % IJ SOLN
10.0000 mL | Freq: Once | INTRAMUSCULAR | Status: AC
  Administered 2011-12-28: 10 mL via INTRAVENOUS

## 2011-12-28 NOTE — Progress Notes (Signed)
Kristin Rubio presents today for injection per MD orders. Boniva 3 mg administered IV push in left antecubital. Administration without incident. Patient tolerated well.

## 2011-12-29 ENCOUNTER — Ambulatory Visit (HOSPITAL_COMMUNITY): Payer: Medicare Other

## 2012-01-23 ENCOUNTER — Other Ambulatory Visit (HOSPITAL_COMMUNITY): Payer: Self-pay | Admitting: Internal Medicine

## 2012-01-23 DIAGNOSIS — Z139 Encounter for screening, unspecified: Secondary | ICD-10-CM

## 2012-01-30 ENCOUNTER — Other Ambulatory Visit (HOSPITAL_COMMUNITY): Payer: Medicare Other

## 2012-02-13 ENCOUNTER — Ambulatory Visit (HOSPITAL_COMMUNITY)
Admission: RE | Admit: 2012-02-13 | Discharge: 2012-02-13 | Disposition: A | Discharge status: Home / Self Care | Payer: Medicare Other | Source: Ambulatory Visit | Attending: Internal Medicine | Admitting: Internal Medicine

## 2012-02-13 DIAGNOSIS — Z139 Encounter for screening, unspecified: Secondary | ICD-10-CM

## 2012-02-13 DIAGNOSIS — M899 Disorder of bone, unspecified: Secondary | ICD-10-CM | POA: Insufficient documentation

## 2012-02-13 DIAGNOSIS — M949 Disorder of cartilage, unspecified: Secondary | ICD-10-CM | POA: Insufficient documentation

## 2012-03-26 ENCOUNTER — Ambulatory Visit (INDEPENDENT_AMBULATORY_CARE_PROVIDER_SITE_OTHER): Payer: Medicare Other | Admitting: Cardiology

## 2012-03-26 ENCOUNTER — Encounter: Payer: Self-pay | Admitting: Cardiology

## 2012-03-26 VITALS — BP 130/64 | HR 67 | Ht 61.0 in | Wt 156.0 lb

## 2012-03-26 DIAGNOSIS — R002 Palpitations: Secondary | ICD-10-CM

## 2012-03-26 DIAGNOSIS — I10 Essential (primary) hypertension: Secondary | ICD-10-CM

## 2012-03-26 NOTE — Progress Notes (Signed)
   Clinical Summary Ms. Mansour is a 79 y.o.female last seen in December 2013. She reports only infrequent, brief palpitations. States that she has been compliant with her current medical therapy. No dizziness or falls, still uses a walker.  ECG today shows normal sinus rhythm.   Allergies  Allergen Reactions  . Amoxicillin Rash    rash    Current Outpatient Prescriptions  Medication Sig Dispense Refill  . acetaminophen (TYLENOL ARTHRITIS PAIN) 650 MG CR tablet Take 650 mg by mouth as needed.      Marland Kitchen aspirin 81 MG tablet Take 81 mg by mouth daily.        . citalopram (CELEXA) 10 MG tablet Take 10 mg by mouth daily.      Marland Kitchen diltiazem (CARDIZEM SR) 60 MG 12 hr capsule TAKE (1) CAPSULE BY MOUTH TWICE DAILY.  60 capsule  3  . docusate sodium (COLACE) 100 MG capsule Take 100 mg by mouth 3 (three) times daily.        . hydrochlorothiazide (MICROZIDE) 12.5 MG capsule Take 12.5 mg by mouth daily.      . Ibandronate Sodium (BONIVA IV) Inject into the vein every 3 (three) months.       . metoprolol (TOPROL-XL) 100 MG 24 hr tablet Take 100 mg by mouth daily.        . Multiple Vitamins-Minerals (ICAPS) CAPS Take 1 capsule by mouth daily.        . pantoprazole (PROTONIX) 40 MG tablet Take 40 mg by mouth daily.        . ramipril (ALTACE) 5 MG capsule TAKE ONE CAPSULE DAILY.  30 capsule  12   No current facility-administered medications for this visit.    Past Medical History  Diagnosis Date  . Essential hypertension, benign   . Palpitations   . Lumbar disc disease   . Osteoporosis   . GERD (gastroesophageal reflux disease)   . Anxiety   . History of DVT (deep vein thrombosis)   . Renal insufficiency   . Macular degeneration   . Uterine prolapse   . FH: osteoporosis 10/31/2010    Social History Ms. Dimalanta reports that she has quit smoking. Her smoking use included Cigarettes. She smoked 0.00 packs per day. She has never used smokeless tobacco. Ms. Statzer reports that she does not drink  alcohol.  Review of Systems Negative except as outlined.  Physical Examination Filed Vitals:   03/26/12 1358  BP: 130/64  Pulse: 67   Filed Weights   03/26/12 1358  Weight: 156 lb (70.761 kg)    Overweight elderly woman in no acute distress.  HEENT: Conjunctiva and lids normal, oropharynx with moist mucosa.  Neck: Supple, no elevated JVP or loud bruits, no thyromegaly.  Lungs: Clear to auscultation, nonlabored.  Cardiac: Regular rate and rhythm, no pathologic systolic murmur or S3 gallop.  Skin: Warm and dry.  Extremities: No significant pitting edema, trace ankle edema noted.   Problem List and Plan   PALPITATIONS Symptomatically stable on present medical regimen. Continue observation.  ESSENTIAL HYPERTENSION, BENIGN No change to current regimen.    Jonelle Sidle, M.D., F.A.C.C.

## 2012-03-26 NOTE — Patient Instructions (Addendum)
Your physician recommends that you schedule a follow-up appointment in: 3 months.  

## 2012-03-26 NOTE — Assessment & Plan Note (Signed)
Symptomatically stable on present medical regimen. Continue observation.

## 2012-03-26 NOTE — Assessment & Plan Note (Signed)
No change to current regimen. 

## 2012-03-28 ENCOUNTER — Encounter (HOSPITAL_COMMUNITY): Payer: Medicare Other | Attending: Internal Medicine

## 2012-03-28 DIAGNOSIS — M810 Age-related osteoporosis without current pathological fracture: Secondary | ICD-10-CM | POA: Insufficient documentation

## 2012-03-28 MED ORDER — SODIUM CHLORIDE 0.9 % IJ SOLN
10.0000 mL | Freq: Once | INTRAMUSCULAR | Status: AC
  Administered 2012-03-28: 10 mL via INTRAVENOUS

## 2012-03-28 MED ORDER — IBANDRONATE SODIUM 3 MG/3ML IV SOLN
3.0000 mg | Freq: Once | INTRAVENOUS | Status: AC
  Administered 2012-03-28: 3 mg via INTRAVENOUS

## 2012-03-28 NOTE — Progress Notes (Signed)
Kristin Rubio presents today for injection per MD orders. boniva 3 mg administered IV in left AC. Administration without incident. Patient tolerated well.

## 2012-04-25 ENCOUNTER — Other Ambulatory Visit: Payer: Self-pay | Admitting: Cardiology

## 2012-04-29 ENCOUNTER — Encounter: Payer: Self-pay | Admitting: *Deleted

## 2012-04-30 ENCOUNTER — Ambulatory Visit (INDEPENDENT_AMBULATORY_CARE_PROVIDER_SITE_OTHER): Payer: Medicare Other | Admitting: Obstetrics & Gynecology

## 2012-04-30 ENCOUNTER — Encounter: Payer: Self-pay | Admitting: Obstetrics & Gynecology

## 2012-04-30 VITALS — BP 110/60 | Ht 61.0 in | Wt 156.0 lb

## 2012-04-30 DIAGNOSIS — N813 Complete uterovaginal prolapse: Secondary | ICD-10-CM

## 2012-04-30 DIAGNOSIS — N814 Uterovaginal prolapse, unspecified: Secondary | ICD-10-CM

## 2012-04-30 NOTE — Progress Notes (Signed)
Patient ID: Kristin Rubio, female   DOB: 1917-08-13, 79 y.o.   MRN: [id number]      Kristin Rubio presents today for routine follow up related to her pessary.   She uses a Gelhorn 2 1/4 in She reports no vaginal discharge or vaginal bleeding.  Exam reveals no undue vaginal mucosal pressure of breakdown, no discharge and no vaginal bleeding.     Kristin Rubio will be sen back in 6 months for continued follow up.  Koven Belinsky H 04/30/2012 2:15 PM

## 2012-04-30 NOTE — Patient Instructions (Signed)
Prolapse  Prolapse means the falling down, bulging, dropping, or drooping of a body part. Organs that commonly prolapse include the rectum, small intestine, bladder, urethra, vagina (birth canal), uterus (womb), and cervix. Prolapse occurs when the ligaments and muscle tissue around the rectum, bladder, and uterus are damaged or weakened.  CAUSES  This happens especially with:  Childbirth. Some women feel pelvic pressure or have trouble holding their urine right after childbirth, because of stretching and tearing of pelvic tissues. This generally gets better with time and the feeling usually goes away, but it may return with aging.  Chronic heavy lifting.  Aging.  Menopause, with loss of estrogen production weakening the pelvic ligaments and muscles.  Past pelvic surgery.  Obesity.  Chronic constipation.  Chronic cough. Prolapse may affect a single organ, or several organs may prolapse at the same time. The front wall of the vagina holds up the bladder. The back wall holds up part of the lower intestine, or rectum. The uterus fills a spot in the middle. All these organs can be involved when the ligaments and muscles around the vagina relax too much. This often gets worse when women stop producing estrogen (menopause). SYMPTOMS  Uncontrolled loss of urine (incontinence) with cough, sneeze, straining, and exercise.  More force may be required to have a bowel movement, due to trapping of the stool.  When part of an organ bulges through the opening of the vagina, there is sometimes a feeling of heaviness or pressure. It may feel as though something is falling out. This sensation increases with coughing or bearing down.  If the organs protrude through the opening of the vagina and rub against the clothing, there may be soreness, ulcers, infection, pain, and bleeding.  Lower back pain.  Pushing in the upper or lower part of the vagina, to pass urine or have a bowel movement.  Problems  having sexual intercourse.  Being unable to insert a tampon or applicator. DIAGNOSIS  Usually, a physical exam is all that is needed to identify the problem. During the examination, you may be asked to cough and strain while lying down, sitting up, and standing up. Your caregiver will determine if more testing is required, such as bladder function tests. Some diagnoses are:  Cystocele: Bulging and falling of the bladder into the top of the vagina.  Rectocele: Part of the rectum bulging into the vagina.  Prolapse of the uterus: The uterus falls or drops into the vagina.  Enterocele: Bulging of the top of the vagina, after a hysterectomy (uterus removal), with the small intestine bulging into the vagina. A hernia in the top of the vagina.  Urethrocele: The urethra (urine carrying tube) bulging into the vagina. TREATMENT  In most cases, prolapse needs to be treated only if it produces symptoms. If the symptoms are interfering with your usual daily or sexual activities, treatment may be necessary. The following are some measures that may be used to treat prolapse.  Estrogen may help elderly women with mild prolapse.  Kegel exercises may help mild cases of prolapse, by strengthening and tightening the muscles of the pelvic floor.  Pessaries are used in women who choose not to, or are unable to, have surgery. A pessary is a doughnut-shaped piece of plastic or rubber that is put into the vagina to keep the organs in place. This device must be fitted by your caregiver. Your caregiver will also explain how to care for yourself with the pessary. If it works well for you,   to keep the organs in place. This device must be fitted by your caregiver. Your caregiver will also explain how to care for yourself with the pessary. If it works well for you, this may be the only treatment required.   Surgery is often the only form of treatment for more severe prolapses. There are different types of surgery available. You should discuss what the best procedure is for you. If the uterus is prolapsed, it may be removed (hysterectomy) as part of the surgical treatment. Your caregiver will  discuss the risks and benefits with you.   Uterine-vaginal suspension (surgery to hold up the organs) may be used, especially if you want to maintain your fertility.  No form of treatment is guaranteed to correct the prolapse or relieve the symptoms.  HOME CARE INSTRUCTIONS    Wear a sanitary pad or absorbent product if you have incontinence of urine.   Avoid heavy lifting and straining with exercise and work.   Take over-the-counter pain medicine for minor discomfort.   Try taking estrogen or using estrogen vaginal cream.   Try Kegel exercises or use a pessary, before deciding to have surgery.   Do Kegel exercises after having a baby.  SEEK MEDICAL CARE IF:    Your symptoms interfere with your daily activities.   You need medicine to help with the discomfort.   You need to be fitted with a pessary.   You notice bleeding from the vagina.   You think you have ulcers or you notice ulcers on the cervix.   You have an oral temperature above 102 F (38.9 C).   You develop pain or blood with urination.   You have bleeding with a bowel movement.   The symptoms are interfering with your sex life.   You have urinary incontinence that interferes with your daily activities.   You lose urine with sexual intercourse.   You have a chronic cough.   You have chronic constipation.  Document Released: 07/09/2002 Document Revised: 03/27/2011 Document Reviewed: 01/17/2009  [employer] Patient Information 2013 [employer]

## 2012-06-28 ENCOUNTER — Ambulatory Visit (HOSPITAL_COMMUNITY)
Admission: RE | Admit: 2012-06-28 | Discharge: 2012-06-28 | Disposition: A | Discharge status: Home / Self Care | Payer: Medicare Other | Source: Ambulatory Visit | Attending: Internal Medicine | Admitting: Internal Medicine

## 2012-06-28 ENCOUNTER — Encounter (HOSPITAL_COMMUNITY): Payer: Self-pay

## 2012-06-28 ENCOUNTER — Ambulatory Visit (HOSPITAL_COMMUNITY): Payer: Medicare Other

## 2012-06-28 DIAGNOSIS — M810 Age-related osteoporosis without current pathological fracture: Secondary | ICD-10-CM | POA: Insufficient documentation

## 2012-06-28 LAB — RENAL FUNCTION PANEL
BUN: 21 mg/dL (ref 6–23)
CO2: 30 mEq/L (ref 19–32)
Chloride: 100 mEq/L (ref 96–112)
Creatinine, Ser: 1.31 mg/dL — ABNORMAL HIGH (ref 0.50–1.10)
GFR calc non Af Amer: 34 mL/min — ABNORMAL LOW (ref >90)
Potassium: 3.6 mEq/L (ref 3.5–5.1)

## 2012-06-28 MED ORDER — IBANDRONATE SODIUM 3 MG/3ML IV SOLN
3.0000 mg | Freq: Once | INTRAVENOUS | Status: AC
  Administered 2012-06-28: 3 mg via INTRAVENOUS
  Filled 2012-06-28: qty 3

## 2012-06-28 NOTE — Progress Notes (Signed)
Patient tolerated well.discharged with future appt. For sept.12 at 2:00.

## 2012-07-02 NOTE — Progress Notes (Signed)
Late entry: Boniva 3mg  iv given for osteoporosis unable to take po's per Dr Ouida Sills

## 2012-07-08 ENCOUNTER — Ambulatory Visit (INDEPENDENT_AMBULATORY_CARE_PROVIDER_SITE_OTHER): Payer: Medicare Other | Admitting: Cardiology

## 2012-07-08 ENCOUNTER — Encounter: Payer: Self-pay | Admitting: Cardiology

## 2012-07-08 VITALS — BP 112/58 | HR 66 | Ht 61.0 in | Wt 153.0 lb

## 2012-07-08 DIAGNOSIS — R002 Palpitations: Secondary | ICD-10-CM

## 2012-07-08 DIAGNOSIS — I10 Essential (primary) hypertension: Secondary | ICD-10-CM

## 2012-07-08 NOTE — Assessment & Plan Note (Signed)
Symptomatically stable on current regimen. No changes were made today.

## 2012-07-08 NOTE — Patient Instructions (Addendum)
Your physician recommends that you schedule a follow-up appointment in: 3 months.  

## 2012-07-08 NOTE — Assessment & Plan Note (Signed)
Blood pressure is normal today. 

## 2012-07-08 NOTE — Progress Notes (Signed)
   Clinical Summary Kristin Rubio is a 79 y.o.female last seen in March of this year. She reports reasonable control of her early morning palpitations on current regimen. Toprol-XL is taken as an evening dose. Otherwise no chest pain or progressive shortness of breath. She does get out and walk around her  apartment complex with a walker once a day.   Allergies  Allergen Reactions  . Amoxicillin Rash    rash    Current Outpatient Prescriptions  Medication Sig Dispense Refill  . acetaminophen (TYLENOL ARTHRITIS PAIN) 650 MG CR tablet Take 650 mg by mouth as needed.      Marland Kitchen aspirin 81 MG tablet Take 81 mg by mouth daily.        . citalopram (CELEXA) 10 MG tablet Take 10 mg by mouth daily.      Marland Kitchen diltiazem (CARDIZEM SR) 60 MG 12 hr capsule TAKE (1) CAPSULE BY MOUTH TWICE DAILY.  60 capsule  6  . docusate sodium (COLACE) 100 MG capsule Take 100 mg by mouth 3 (three) times daily.        . hydrochlorothiazide (MICROZIDE) 12.5 MG capsule Take 12.5 mg by mouth daily.      . Ibandronate Sodium (BONIVA IV) Inject into the vein every 3 (three) months.       . metoprolol (TOPROL-XL) 100 MG 24 hr tablet Take 100 mg by mouth daily.        . Multiple Vitamins-Minerals (ICAPS) CAPS Take 1 capsule by mouth daily.        . pantoprazole (PROTONIX) 40 MG tablet Take 40 mg by mouth daily.        . ramipril (ALTACE) 5 MG capsule TAKE ONE CAPSULE DAILY.  30 capsule  12   No current facility-administered medications for this visit.    Past Medical History  Diagnosis Date  . Essential hypertension, benign   . Palpitations   . Lumbar disc disease   . Osteoporosis   . GERD (gastroesophageal reflux disease)   . Anxiety   . History of DVT (deep vein thrombosis)   . Renal insufficiency   . Macular degeneration   . Uterine prolapse   . FH: osteoporosis 10/31/2010    Social History Kristin Rubio reports that she has quit smoking. Her smoking use included Cigarettes. She smoked 0.00 packs per day. She has never  used smokeless tobacco. Kristin Rubio reports that she does not drink alcohol.  Review of Systems Had no falls, stable appetite. Otherwise negative.  Physical Examination Filed Vitals:   07/08/12 1508  BP: 112/58  Pulse: 66   Filed Weights   07/08/12 1508  Weight: 153 lb 0.6 oz (69.418 kg)    Elderly woman, comfortable at rest.  HEENT: Conjunctiva and lids normal, oropharynx with moist mucosa.  Neck: Supple, no elevated JVP or loud bruits, no thyromegaly.  Lungs: Clear to auscultation, nonlabored.  Cardiac: Regular rate and rhythm, no pathologic systolic murmur or S3 gallop.  Skin: Warm and dry.  Extremities: No significant pitting edema, trace ankle edema noted.   Problem List and Plan   Palpitations Symptomatically stable on current regimen. No changes were made today.  Essential hypertension, benign Blood pressure is normal today.    Jonelle Sidle, M.D., F.A.C.C.

## 2012-09-17 ENCOUNTER — Other Ambulatory Visit: Payer: Self-pay | Admitting: Adult Health

## 2012-09-27 ENCOUNTER — Encounter (HOSPITAL_COMMUNITY)
Admission: RE | Admit: 2012-09-27 | Discharge: 2012-09-27 | Disposition: A | Discharge status: Home / Self Care | Payer: Medicare Other | Source: Ambulatory Visit | Attending: Internal Medicine | Admitting: Internal Medicine

## 2012-09-27 DIAGNOSIS — I10 Essential (primary) hypertension: Secondary | ICD-10-CM | POA: Insufficient documentation

## 2012-09-27 DIAGNOSIS — M810 Age-related osteoporosis without current pathological fracture: Secondary | ICD-10-CM | POA: Insufficient documentation

## 2012-09-27 DIAGNOSIS — R002 Palpitations: Secondary | ICD-10-CM | POA: Insufficient documentation

## 2012-09-27 LAB — RENAL FUNCTION PANEL
Albumin: 3.4 g/dL — ABNORMAL LOW (ref 3.5–5.2)
BUN: 17 mg/dL (ref 6–23)
Calcium: 9.6 mg/dL (ref 8.4–10.5)
Chloride: 98 mEq/L (ref 96–112)
Creatinine, Ser: 1.37 mg/dL — ABNORMAL HIGH (ref 0.50–1.10)
GFR calc non Af Amer: 32 mL/min — ABNORMAL LOW (ref >90)
Phosphorus: 4 mg/dL (ref 2.3–4.6)

## 2012-09-27 MED ORDER — IBANDRONATE SODIUM 3 MG/3ML IV SOLN
3.0000 mg | Freq: Once | INTRAVENOUS | Status: AC
  Administered 2012-09-27: 3 mg via INTRAVENOUS

## 2012-09-27 MED ORDER — SODIUM CHLORIDE 0.9 % IJ SOLN
10.0000 mL | INTRAMUSCULAR | Status: DC | PRN
  Administered 2012-09-27: 10 mL via INTRAVENOUS

## 2012-09-27 MED ORDER — IBANDRONATE SODIUM 3 MG/3ML IV SOLN
INTRAVENOUS | Status: AC
  Filled 2012-09-27: qty 3

## 2012-09-27 NOTE — Progress Notes (Signed)
Results for SANDI, TOWE (MRN [id number]) as of 09/27/2012 14:55  Ref. Range 09/27/2012 13:50  Sodium Latest Range: 135-145 mEq/L 136  Potassium Latest Range: 3.5-5.1 mEq/L 3.7  Chloride Latest Range: 96-112 mEq/L 98  CO2 Latest Range: 19-32 mEq/L 29  BUN Latest Range: 6-23 mg/dL 17  Creatinine Latest Range: 0.50-1.10 mg/dL 7.82 (H)  Calcium Latest Range: 8.4-10.5 mg/dL 9.6  GFR calc non Af Amer Latest Range: >90 mL/min 32 (L)  GFR calc Af Amer Latest Range: >90 mL/min 37 (L)  Glucose Latest Range: 70-99 mg/dL 95  Phosphorus Latest Range: 2.3-4.6 mg/dL 4.0  Albumin Latest Range: 3.5-5.2 g/dL 3.4 (L)

## 2012-10-08 ENCOUNTER — Ambulatory Visit: Payer: Medicare Other | Admitting: Cardiology

## 2012-10-15 ENCOUNTER — Encounter: Payer: Self-pay | Admitting: Cardiology

## 2012-10-15 ENCOUNTER — Ambulatory Visit (INDEPENDENT_AMBULATORY_CARE_PROVIDER_SITE_OTHER): Payer: Medicare Other | Admitting: Cardiology

## 2012-10-15 VITALS — BP 104/52 | HR 70 | Ht 61.0 in | Wt 151.0 lb

## 2012-10-15 DIAGNOSIS — I10 Essential (primary) hypertension: Secondary | ICD-10-CM

## 2012-10-15 DIAGNOSIS — R002 Palpitations: Secondary | ICD-10-CM

## 2012-10-15 NOTE — Progress Notes (Signed)
    Clinical Summary Ms. Hoban is a 79 y.o.female last seen in June. She reports stable palpitations, no major changes in her medications, no hospitalizations.  Recent lab work noted finding potassium 3.7, BUN 17, creatinine 1.3.  She does try to get outside and walk around her apartment complex in the afternoons. She reports stable dyspnea exertion, no chest pain.  Allergies  Allergen Reactions  . Amoxicillin Rash    rash    Current Outpatient Prescriptions  Medication Sig Dispense Refill  . acetaminophen (TYLENOL ARTHRITIS PAIN) 650 MG CR tablet Take 650 mg by mouth as needed.      Marland Kitchen aspirin 81 MG tablet Take 81 mg by mouth daily.        . citalopram (CELEXA) 10 MG tablet Take 10 mg by mouth daily.      Marland Kitchen diltiazem (CARDIZEM SR) 60 MG 12 hr capsule TAKE (1) CAPSULE BY MOUTH TWICE DAILY.  60 capsule  6  . docusate sodium (COLACE) 100 MG capsule Take 100 mg by mouth 3 (three) times daily.       . hydrochlorothiazide (MICROZIDE) 12.5 MG capsule Take 12.5 mg by mouth daily.      . Ibandronate Sodium (BONIVA IV) Inject into the vein every 3 (three) months.       . metoprolol (TOPROL-XL) 100 MG 24 hr tablet Take 100 mg by mouth daily.        . Multiple Vitamins-Minerals (ICAPS) CAPS Take 1 capsule by mouth daily.        . pantoprazole (PROTONIX) 40 MG tablet Take 40 mg by mouth daily.        . ramipril (ALTACE) 5 MG capsule TAKE ONE CAPSULE DAILY.  30 capsule  3   No current facility-administered medications for this visit.    Past Medical History  Diagnosis Date  . Essential hypertension, benign   . Palpitations   . Lumbar disc disease   . Osteoporosis   . GERD (gastroesophageal reflux disease)   . Anxiety   . History of DVT (deep vein thrombosis)   . Renal insufficiency   . Macular degeneration   . Uterine prolapse   . FH: osteoporosis 10/31/2010    Social History Ms. Palazzo reports that she has quit smoking. Her smoking use included Cigarettes. She smoked 0.00 packs per  day. She has never used smokeless tobacco. Ms. Peron reports that she does not drink alcohol.  Review of Systems No falls. Stable appetite. Does not sleep well. Otherwise negative.  Physical Examination Filed Vitals:   10/15/12 1423  BP: 104/52  Pulse: 70   Filed Weights   10/15/12 1423  Weight: 151 lb (68.493 kg)    Elderly woman, comfortable at rest.  HEENT: Conjunctiva and lids normal, oropharynx with moist mucosa.  Neck: Supple, no elevated JVP or loud bruits, no thyromegaly.  Lungs: Clear to auscultation, nonlabored.  Cardiac: Regular rate and rhythm, soft systolic murmur or S3 gallop.  Skin: Warm and dry.  Extremities: No significant pitting edema, trace ankle edema noted.   Problem List and Plan   Palpitations No change in intensity or frequency, continue current medical regimen and observation.  Essential hypertension, benign Blood pressure is well controlled today.    Jonelle Sidle, M.D., F.A.C.C.

## 2012-10-15 NOTE — Patient Instructions (Addendum)
Your physician recommends that you schedule a follow-up appointment in: 3 months. You will receive a reminder letter in the mail in about 1-2 months reminding you to call and schedule your appointment. If you don't receive this letter, please contact our office. Your physician recommends that you continue on your current medications as directed. Please refer to the Current Medication list given to you today. 

## 2012-10-15 NOTE — Assessment & Plan Note (Signed)
No change in intensity or frequency, continue current medical regimen and observation.

## 2012-10-15 NOTE — Assessment & Plan Note (Signed)
Blood pressure is well-controlled today. 

## 2012-11-05 ENCOUNTER — Encounter: Payer: Self-pay | Admitting: Obstetrics & Gynecology

## 2012-11-05 ENCOUNTER — Ambulatory Visit (INDEPENDENT_AMBULATORY_CARE_PROVIDER_SITE_OTHER): Payer: Medicare Other | Admitting: Obstetrics & Gynecology

## 2012-11-05 VITALS — BP 120/60 | Wt 152.0 lb

## 2012-11-05 DIAGNOSIS — N813 Complete uterovaginal prolapse: Secondary | ICD-10-CM

## 2012-11-05 NOTE — Progress Notes (Signed)
Patient ID: Kristin Rubio, female   DOB: [birth year], 79 y.o.   MRN: [id number] Pt having increasing difficulty with pessary, slipping and descending.  As a result she would like to try a pessary sabbatical  It is removed today and the pt is instructed to keep it and return if needed for therapy and evaluation

## 2012-11-25 ENCOUNTER — Other Ambulatory Visit: Payer: Self-pay | Admitting: Cardiology

## 2012-12-16 ENCOUNTER — Other Ambulatory Visit: Payer: Self-pay | Admitting: Adult Health

## 2012-12-30 ENCOUNTER — Encounter (HOSPITAL_COMMUNITY): Payer: Self-pay

## 2012-12-30 ENCOUNTER — Encounter (HOSPITAL_COMMUNITY)
Admission: RE | Admit: 2012-12-30 | Discharge: 2012-12-30 | Disposition: A | Discharge status: Home / Self Care | Payer: Medicare Other | Source: Ambulatory Visit | Attending: Internal Medicine | Admitting: Internal Medicine

## 2012-12-30 DIAGNOSIS — M810 Age-related osteoporosis without current pathological fracture: Secondary | ICD-10-CM | POA: Insufficient documentation

## 2012-12-30 LAB — RENAL FUNCTION PANEL
Albumin: 3.4 g/dL — ABNORMAL LOW (ref 3.5–5.2)
BUN: 19 mg/dL (ref 6–23)
CO2: 27 mEq/L (ref 19–32)
Chloride: 100 mEq/L (ref 96–112)
Creatinine, Ser: 1.37 mg/dL — ABNORMAL HIGH (ref 0.50–1.10)
Potassium: 3.8 mEq/L (ref 3.5–5.1)
Sodium: 137 mEq/L (ref 135–145)

## 2012-12-30 MED ORDER — IBANDRONATE SODIUM 3 MG/3ML IV SOLN
3.0000 mg | Freq: Once | INTRAVENOUS | Status: AC
  Administered 2012-12-30: 3 mg via INTRAVENOUS
  Filled 2012-12-30: qty 3

## 2012-12-30 MED ORDER — SODIUM CHLORIDE 0.9 % IJ SOLN
10.0000 mL | Freq: Once | INTRAMUSCULAR | Status: AC
  Administered 2012-12-30: 10 mL via INTRAVENOUS
  Filled 2012-12-30: qty 10

## 2012-12-30 NOTE — Progress Notes (Signed)
Results for PENIEL, BIEL (MRN [id number]) as of 12/30/2012 14:21 Labs drawn prior to Boniva 3mg  IV. Tolerated well.  Ref. Range 12/30/2012 13:05  Sodium Latest Range: 135-145 mEq/L 137  Potassium Latest Range: 3.5-5.1 mEq/L 3.8  Chloride Latest Range: 96-112 mEq/L 100  CO2 Latest Range: 19-32 mEq/L 27  BUN Latest Range: 6-23 mg/dL 19  Creatinine Latest Range: 0.50-1.10 mg/dL 2.95 (H)  Calcium Latest Range: 8.4-10.5 mg/dL 9.4  GFR calc non Af Amer Latest Range: >90 mL/min 32 (L)  GFR calc Af Amer Latest Range: >90 mL/min 37 (L)  Glucose Latest Range: 70-99 mg/dL 84  Phosphorus Latest Range: 2.3-4.6 mg/dL 3.9  Albumin Latest Range: 3.5-5.2 g/dL 3.4 (L)

## 2013-01-03 ENCOUNTER — Emergency Department (HOSPITAL_COMMUNITY)
Admission: EM | Admit: 2013-01-03 | Discharge: 2013-01-04 | Disposition: A | Discharge status: Home / Self Care | Payer: Medicare Other | Attending: Emergency Medicine | Admitting: Emergency Medicine

## 2013-01-03 ENCOUNTER — Emergency Department (HOSPITAL_COMMUNITY): Payer: Medicare Other

## 2013-01-03 ENCOUNTER — Encounter (HOSPITAL_COMMUNITY): Payer: Self-pay | Admitting: Emergency Medicine

## 2013-01-03 DIAGNOSIS — Z8739 Personal history of other diseases of the musculoskeletal system and connective tissue: Secondary | ICD-10-CM | POA: Insufficient documentation

## 2013-01-03 DIAGNOSIS — Y9389 Activity, other specified: Secondary | ICD-10-CM | POA: Insufficient documentation

## 2013-01-03 DIAGNOSIS — H3530 Unspecified macular degeneration: Secondary | ICD-10-CM | POA: Insufficient documentation

## 2013-01-03 DIAGNOSIS — S0083XA Contusion of other part of head, initial encounter: Secondary | ICD-10-CM

## 2013-01-03 DIAGNOSIS — Y9289 Other specified places as the place of occurrence of the external cause: Secondary | ICD-10-CM | POA: Insufficient documentation

## 2013-01-03 DIAGNOSIS — IMO0002 Reserved for concepts with insufficient information to code with codable children: Secondary | ICD-10-CM | POA: Insufficient documentation

## 2013-01-03 DIAGNOSIS — I10 Essential (primary) hypertension: Secondary | ICD-10-CM | POA: Insufficient documentation

## 2013-01-03 DIAGNOSIS — Z87448 Personal history of other diseases of urinary system: Secondary | ICD-10-CM | POA: Insufficient documentation

## 2013-01-03 DIAGNOSIS — Z23 Encounter for immunization: Secondary | ICD-10-CM | POA: Insufficient documentation

## 2013-01-03 DIAGNOSIS — Z8742 Personal history of other diseases of the female genital tract: Secondary | ICD-10-CM | POA: Insufficient documentation

## 2013-01-03 DIAGNOSIS — S0180XA Unspecified open wound of other part of head, initial encounter: Secondary | ICD-10-CM | POA: Insufficient documentation

## 2013-01-03 DIAGNOSIS — S01111A Laceration without foreign body of right eyelid and periocular area, initial encounter: Secondary | ICD-10-CM

## 2013-01-03 DIAGNOSIS — Z87891 Personal history of nicotine dependence: Secondary | ICD-10-CM | POA: Insufficient documentation

## 2013-01-03 DIAGNOSIS — Z7983 Long term (current) use of bisphosphonates: Secondary | ICD-10-CM | POA: Insufficient documentation

## 2013-01-03 DIAGNOSIS — S0990XA Unspecified injury of head, initial encounter: Secondary | ICD-10-CM

## 2013-01-03 DIAGNOSIS — K219 Gastro-esophageal reflux disease without esophagitis: Secondary | ICD-10-CM | POA: Insufficient documentation

## 2013-01-03 DIAGNOSIS — S0181XA Laceration without foreign body of other part of head, initial encounter: Secondary | ICD-10-CM

## 2013-01-03 DIAGNOSIS — Z7982 Long term (current) use of aspirin: Secondary | ICD-10-CM | POA: Insufficient documentation

## 2013-01-03 DIAGNOSIS — Z79899 Other long term (current) drug therapy: Secondary | ICD-10-CM | POA: Insufficient documentation

## 2013-01-03 DIAGNOSIS — F411 Generalized anxiety disorder: Secondary | ICD-10-CM | POA: Insufficient documentation

## 2013-01-03 DIAGNOSIS — Z880 Allergy status to penicillin: Secondary | ICD-10-CM | POA: Insufficient documentation

## 2013-01-03 DIAGNOSIS — M810 Age-related osteoporosis without current pathological fracture: Secondary | ICD-10-CM | POA: Insufficient documentation

## 2013-01-03 DIAGNOSIS — Z86718 Personal history of other venous thrombosis and embolism: Secondary | ICD-10-CM | POA: Insufficient documentation

## 2013-01-03 MED ORDER — TETANUS-DIPHTH-ACELL PERTUSSIS 5-2.5-18.5 LF-MCG/0.5 IM SUSP
0.5000 mL | Freq: Once | INTRAMUSCULAR | Status: AC
  Administered 2013-01-04: 0.5 mL via INTRAMUSCULAR
  Filled 2013-01-03: qty 0.5

## 2013-01-03 NOTE — ED Notes (Signed)
Stumbled on her shoes and tripped striking her head on a pottery on the floor and injured her head.  Patient has laceration on her forehead.  Patient denies LOC. Patient is alert and oriented x 4

## 2013-01-03 NOTE — ED Provider Notes (Signed)
CSN: [id number]     Arrival date & time 01/03/13  2303 History   First MD Initiated Contact with Patient 01/03/13 2347     Chief Complaint  Patient presents with  . Fall   (Consider location/radiation/quality/duration/timing/severity/associated sxs/prior Treatment) HPI This is a 79 year old female over one of her shoes just prior to arrival. She struck her head on a piece of pottery and has lacerations to her forehead. There was no loss of consciousness. She denies neck pain or other injury. She was spinally immobilized by EMS prior to arrival. She is not on anticoagulation apart from low dose aspirin. She was complaining of mild to moderate pain in her forehead. 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  Past Medical History  Diagnosis Date  . Essential hypertension, benign   . Palpitations   . Lumbar disc disease   . Osteoporosis   . GERD (gastroesophageal reflux disease)   . Anxiety   . History of DVT (deep vein thrombosis)   . Renal insufficiency   . Macular degeneration   . Uterine prolapse   . FH: osteoporosis 10/31/2010   Past Surgical History  Procedure Laterality Date  . Cholecystectomy    . Knee arthroscopy      Right  . Cervical polypectomy      Excision of interest cervical/ endometrial lesion  . Finger ganglion cyst excision      Mucoid cyst, left middle finger  . Vitrectomy      Partial, left eye   Family History  Problem Relation Age of Onset  . Cancer Paternal Aunt   . Stroke Paternal Uncle    History  Substance Use Topics  . Smoking status: Former Smoker    Types: Cigarettes  . Smokeless tobacco: Never Used  . Alcohol Use: No   OB History   Grav Para Term Preterm Abortions TAB SAB Ect Mult Living                 Review of Systems  All other systems reviewed and are negative.    Allergies  Amoxicillin  Home Medications   Current Outpatient Rx  Name  Route  Sig  Dispense  Refill  . acetaminophen (TYLENOL ARTHRITIS PAIN) 650 MG CR  tablet   Oral   Take 650 mg by mouth as needed.         Marland Kitchen aspirin 81 MG tablet   Oral   Take 81 mg by mouth daily.           . citalopram (CELEXA) 10 MG tablet   Oral   Take 10 mg by mouth daily.         Marland Kitchen diltiazem (CARDIZEM SR) 60 MG 12 hr capsule      TAKE (1) CAPSULE BY MOUTH TWICE DAILY.   60 capsule   6   . docusate sodium (COLACE) 100 MG capsule   Oral   Take 100 mg by mouth 3 (three) times daily.          . hydrochlorothiazide (MICROZIDE) 12.5 MG capsule   Oral   Take 12.5 mg by mouth daily.         . Ibandronate Sodium (BONIVA IV)   Intravenous   Inject into the vein every 3 (three) months.          . metoprolol (TOPROL-XL) 100 MG 24 hr tablet   Oral   Take 100 mg by mouth daily.           Marland Kitchen  Multiple Vitamins-Minerals (ICAPS) CAPS   Oral   Take 1 capsule by mouth daily.           . pantoprazole (PROTONIX) 40 MG tablet   Oral   Take 40 mg by mouth daily.           . ramipril (ALTACE) 5 MG capsule      TAKE ONE CAPSULE DAILY.   30 capsule   3    BP 158/48  Pulse 75  Temp(Src) 97.6 F (36.4 C) (Oral)  Resp 18  Ht 5\' 1"  (1.549 m)  Wt 153 lb (69.4 kg)  BMI 28.92 kg/m2  SpO2 98%  Physical Exam General: Well-developed, well-nourished female in no acute distress; appearance consistent with age of record; immobilized on spine board HENT: normocephalic; linear laceration upper mid-forehead; stellate laceration mid-forehead with underlying hematoma; superficial laceration right upper eyelid Eyes: pupils equal, round and reactive to light; extraocular muscles intact; left corneal opacity with significantly decreased Neck: supple; nontender Heart: regular rate and rhythm; no murmurs, rubs or gallops Lungs: clear to auscultation bilaterally Abdomen: soft; nondistended; nontender; no masses or hepatosplenomegaly; bowel sounds present Extremities: Arthritic changes; pulses normal; ecchymosis of right fourth finger without deformity or  functional deficit Neurologic: Awake, alert and oriented; motor function intact in all extremities and symmetric; no facial droop Skin: Warm and dry Psychiatric: Normal mood and affect    ED Course  Procedures (including critical care time)    MDM  Nursing notes and vitals signs, including pulse oximetry, reviewed.  Summary of this visit's results, reviewed by myself:  Labs:  No results found for this or any previous visit (from the past 24 hour(s)).  Imaging Studies: Ct Head Wo Contrast  01/04/2013   CLINICAL DATA:  Fall with forehead laceration.  EXAM: CT HEAD WITHOUT CONTRAST  TECHNIQUE: Contiguous axial images were obtained from the base of the skull through the vertex without intravenous contrast.  COMPARISON:  None currently available  FINDINGS: Skull and Sinuses:Large hematoma within the central forehead, with subcutaneous gas consistent with laceration. No radiodense foreign body. No calvarial fracture.  Orbits: Cataract resection bilaterally.  Brain: No evidence of acute abnormality, such as acute infarction, hemorrhage, hydrocephalus, or mass lesion/mass effect. Brain atrophy in keeping with age. Chronic small vessel ischemic white matter disease with patchy bilateral cerebral white matter low density. There is also evidence of microvascular ischemic changes in the thalamus bilaterally.  IMPRESSION: 1. No evidence of acute intracranial injury. 2. Forehead laceration and hematoma.  No calvarial fracture.   Electronically Signed   By: Tiburcio Pea M.D.   On: 01/04/2013 01:04   Medical screening examination/treatment/procedure(s) were conducted as a shared visit with non-physician practitioner(s) and myself.  I personally evaluated the patient during the encounter.  2:13 AM Patient continues to be awake and alert. Her wounds have been closed by Ivery Quale, PA-C.        Hanley Seamen, MD 01/04/13 [phone]

## 2013-01-04 MED ORDER — LIDOCAINE HCL (PF) 2 % IJ SOLN
INTRAMUSCULAR | Status: AC
  Administered 2013-01-04: 10 mL
  Filled 2013-01-04: qty 10

## 2013-01-04 NOTE — ED Notes (Signed)
Patient transported to CT 

## 2013-01-04 NOTE — ED Provider Notes (Signed)
REPAIR OF LACERATIONS TO THE FOREHEAD AND RIGHT EYELID.  The patient is a 79 year old female who had a mechanical fall and injured her forehand and her right eyelid.  Patient identified by arm band. Permission for the procedure given by the patient. Procedural time out taken before repair of the lacerations. The procedure was explained to the patient in terms which he understood.  The first laceration is from the hairline to the upper forehand. The area measures 2.4 cm. The wound was cleansed and irrigated with saline. There was no foreign body appreciated. Bleeding was controlled. The wound was anesthetized with 2% plain lidocaine. The wound was repaired with 7 interrupted sutures of 5-0 nylon. With good wound edge approximation. The patient tolerated the procedure without problem.  The second laceration was in the mid-4 head with hematoma underneath. This area was cleansed and irrigated with saline. After cleansing it was found that the patient had a skin tear with laceration around the skin tear. The wound measures 1.6 cm in its entirety. The area was anesthetized with 2% plain lidocaine. The wound was repaired with 4 interrupted sutures of 5-0 nylon. The patient was made aware that there was a thin loose piece of skin that would possibly be loss, but was reassured that the skin would quickly and easily replace these areas. Patient tolerated the procedure without problem.  The third laceration is a 1 cm shallow laceration of the right upper eyelid. The area was cleansed with saline. It was found that it was not a full-thickness laceration. There is no foreign body or debris under the right upper eyelid. There is some mild swelling present. The laceration was repaired with Steri-Strips. With good wound edge approximation. The patient tolerated the procedure without problem.  A dressing was applied to the 4 head of lacerations. The patient was advised to have these sutures removed in the next 7 days.  The patient was given instructions on returning to the emergency department for evaluation if any signs of infection.  Kathie Dike, PA-C 01/04/13 [phone]

## 2013-01-10 ENCOUNTER — Emergency Department (HOSPITAL_COMMUNITY)
Admission: EM | Admit: 2013-01-10 | Discharge: 2013-01-10 | Disposition: A | Discharge status: Home / Self Care | Payer: Medicare Other | Attending: Emergency Medicine | Admitting: Emergency Medicine

## 2013-01-10 ENCOUNTER — Encounter (HOSPITAL_COMMUNITY): Payer: Self-pay | Admitting: Emergency Medicine

## 2013-01-10 DIAGNOSIS — M549 Dorsalgia, unspecified: Secondary | ICD-10-CM | POA: Insufficient documentation

## 2013-01-10 DIAGNOSIS — Z4802 Encounter for removal of sutures: Secondary | ICD-10-CM | POA: Insufficient documentation

## 2013-01-10 DIAGNOSIS — I10 Essential (primary) hypertension: Secondary | ICD-10-CM | POA: Insufficient documentation

## 2013-01-10 DIAGNOSIS — F411 Generalized anxiety disorder: Secondary | ICD-10-CM | POA: Insufficient documentation

## 2013-01-10 DIAGNOSIS — Z86718 Personal history of other venous thrombosis and embolism: Secondary | ICD-10-CM | POA: Insufficient documentation

## 2013-01-10 DIAGNOSIS — K219 Gastro-esophageal reflux disease without esophagitis: Secondary | ICD-10-CM | POA: Insufficient documentation

## 2013-01-10 DIAGNOSIS — R011 Cardiac murmur, unspecified: Secondary | ICD-10-CM | POA: Insufficient documentation

## 2013-01-10 DIAGNOSIS — M2550 Pain in unspecified joint: Secondary | ICD-10-CM | POA: Insufficient documentation

## 2013-01-10 DIAGNOSIS — Z8669 Personal history of other diseases of the nervous system and sense organs: Secondary | ICD-10-CM | POA: Insufficient documentation

## 2013-01-10 DIAGNOSIS — Z87448 Personal history of other diseases of urinary system: Secondary | ICD-10-CM | POA: Insufficient documentation

## 2013-01-10 DIAGNOSIS — Z7982 Long term (current) use of aspirin: Secondary | ICD-10-CM | POA: Insufficient documentation

## 2013-01-10 DIAGNOSIS — Z79899 Other long term (current) drug therapy: Secondary | ICD-10-CM | POA: Insufficient documentation

## 2013-01-10 DIAGNOSIS — Z8742 Personal history of other diseases of the female genital tract: Secondary | ICD-10-CM | POA: Insufficient documentation

## 2013-01-10 DIAGNOSIS — Z87891 Personal history of nicotine dependence: Secondary | ICD-10-CM | POA: Insufficient documentation

## 2013-01-10 DIAGNOSIS — M810 Age-related osteoporosis without current pathological fracture: Secondary | ICD-10-CM | POA: Insufficient documentation

## 2013-01-10 NOTE — ED Notes (Signed)
Pt seen and evaluated by H. Beverely Pace, EDPa for initial assessment.

## 2013-01-10 NOTE — ED Notes (Signed)
Pt here for suture removal to forehead

## 2013-01-10 NOTE — ED Notes (Signed)
Stitches removed and steristrips placed on central forehead wound. Pt tolerated well.

## 2013-01-10 NOTE — ED Provider Notes (Signed)
CSN: [id number]     Arrival date & time 01/10/13  1030 History   First MD Initiated Contact with Patient 01/10/13 1032     Chief Complaint  Patient presents with  . Suture / Staple Removal   (Consider location/radiation/quality/duration/timing/severity/associated sxs/prior Treatment) Patient is a 79 y.o. female presenting with suture removal. The history is provided by the patient.  Suture / Staple Removal This is a new problem. The current episode started in the past 7 days. The problem occurs constantly. The problem has been gradually improving. Associated symptoms include arthralgias. Pertinent negatives include no abdominal pain, chest pain, coughing, fever, nausea, neck pain, vomiting or weakness. Exacerbated by: palpation. She has tried nothing for the symptoms.    Past Medical History  Diagnosis Date  . Essential hypertension, benign   . Palpitations   . Lumbar disc disease   . Osteoporosis   . GERD (gastroesophageal reflux disease)   . Anxiety   . History of DVT (deep vein thrombosis)   . Renal insufficiency   . Macular degeneration   . Uterine prolapse   . FH: osteoporosis 10/31/2010   Past Surgical History  Procedure Laterality Date  . Cholecystectomy    . Knee arthroscopy      Right  . Cervical polypectomy      Excision of interest cervical/ endometrial lesion  . Finger ganglion cyst excision      Mucoid cyst, left middle finger  . Vitrectomy      Partial, left eye   Family History  Problem Relation Age of Onset  . Cancer Paternal Aunt   . Stroke Paternal Uncle    History  Substance Use Topics  . Smoking status: Former Smoker    Types: Cigarettes  . Smokeless tobacco: Never Used  . Alcohol Use: No   OB History   Grav Para Term Preterm Abortions TAB SAB Ect Mult Living                 Review of Systems  Constitutional: Negative for fever and activity change.       All ROS Neg except as noted in HPI  HENT: Negative for nosebleeds.   Eyes: Negative  for photophobia and discharge.  Respiratory: Negative for cough, shortness of breath and wheezing.   Cardiovascular: Negative for chest pain and palpitations.  Gastrointestinal: Negative for nausea, vomiting, abdominal pain and blood in stool.  Genitourinary: Negative for dysuria, frequency and hematuria.  Musculoskeletal: Positive for arthralgias and back pain. Negative for neck pain.  Skin: Negative.   Neurological: Negative for dizziness, seizures, speech difficulty and weakness.  Psychiatric/Behavioral: Negative for hallucinations and confusion. The patient is nervous/anxious.     Allergies  Amoxicillin  Home Medications   Current Outpatient Rx  Name  Route  Sig  Dispense  Refill  . acetaminophen (TYLENOL ARTHRITIS PAIN) 650 MG CR tablet   Oral   Take 650 mg by mouth as needed.         Marland Kitchen aspirin 81 MG tablet   Oral   Take 81 mg by mouth daily.           . citalopram (CELEXA) 10 MG tablet   Oral   Take 10 mg by mouth daily.         Marland Kitchen diltiazem (CARDIZEM SR) 60 MG 12 hr capsule      TAKE (1) CAPSULE BY MOUTH TWICE DAILY.   60 capsule   6   . docusate sodium (COLACE) 100 MG capsule  Oral   Take 100 mg by mouth 3 (three) times daily.          . hydrochlorothiazide (MICROZIDE) 12.5 MG capsule   Oral   Take 12.5 mg by mouth daily.         . Ibandronate Sodium (BONIVA IV)   Intravenous   Inject into the vein every 3 (three) months.          . metoprolol (TOPROL-XL) 100 MG 24 hr tablet   Oral   Take 100 mg by mouth daily.           . Multiple Vitamins-Minerals (ICAPS) CAPS   Oral   Take 1 capsule by mouth daily.           . pantoprazole (PROTONIX) 40 MG tablet   Oral   Take 40 mg by mouth daily.           . ramipril (ALTACE) 5 MG capsule      TAKE ONE CAPSULE DAILY.   30 capsule   3    BP 168/53  Pulse 62  Temp(Src) 97.4 F (36.3 C) (Oral)  Resp 18  SpO2 99% Physical Exam  Nursing note and vitals reviewed. Constitutional: She  appears well-developed and well-nourished.  Non-toxic appearance.  HENT:  Head: Normocephalic.  Right Ear: Tympanic membrane and external ear normal.  Left Ear: Tympanic membrane and external ear normal.  Sutured lacerations to the scalp and forehead are healing nicely. No red streaks. No drainage.  Multiple areas of bruising about the face.  Eyes: EOM and lids are normal. Pupils are equal, round, and reactive to light.  Neck: Normal range of motion. Neck supple. Carotid bruit is not present.  Cardiovascular: Normal rate, regular rhythm, intact distal pulses and normal pulses.   Murmur heard. Pulmonary/Chest: Breath sounds normal. No respiratory distress.  Abdominal: Soft. Bowel sounds are normal. There is no tenderness. There is no guarding.  Lymphadenopathy:       Head (right side): No submandibular adenopathy present.       Head (left side): No submandibular adenopathy present.    She has no cervical adenopathy.  Neurological: She is alert. She has normal strength. No cranial nerve deficit or sensory deficit. Coordination normal.  Skin: Skin is warm and dry.  Psychiatric: She has a normal mood and affect. Her speech is normal.    ED Course  Procedures (including critical care time) Labs Review Labs Reviewed - No data to display Imaging Review No results found.  EKG Interpretation   None       MDM   1. Visit for suture removal    *I have reviewed nursing notes, vital signs, and all appropriate lab and imaging results for this patient.**  Pt sustained laceration about 6 days ago with sutured repair. No signs of infection or problem. Sutures removed without problem. Pt to return if change or problem.  Kathie Dike, PA-C 01/10/13 1116

## 2013-01-13 NOTE — ED Provider Notes (Signed)
Medical screening examination/treatment/procedure(s) were performed by non-physician practitioner and as supervising physician I was immediately available for consultation/collaboration.  EKG Interpretation   None          Christopher J. Pollina, MD 01/13/13 0727 

## 2013-01-14 ENCOUNTER — Ambulatory Visit (INDEPENDENT_AMBULATORY_CARE_PROVIDER_SITE_OTHER): Payer: Medicare Other | Admitting: Cardiology

## 2013-01-14 ENCOUNTER — Encounter: Payer: Self-pay | Admitting: Cardiology

## 2013-01-14 VITALS — BP 138/52 | HR 66 | Ht 60.0 in | Wt 150.0 lb

## 2013-01-14 DIAGNOSIS — R002 Palpitations: Secondary | ICD-10-CM

## 2013-01-14 DIAGNOSIS — I10 Essential (primary) hypertension: Secondary | ICD-10-CM

## 2013-01-14 NOTE — Assessment & Plan Note (Signed)
Continue current regimen. She reports followup with Dr. Ouida Sills earlier today.

## 2013-01-14 NOTE — Assessment & Plan Note (Signed)
Remains controlled on current doses of Cardizem SR and Toprol-XL.

## 2013-01-14 NOTE — Patient Instructions (Signed)
Your physician recommends that you schedule a follow-up appointment in: 3 months. Your physician recommends that you continue on your current medications as directed. Please refer to the Current Medication list given to you today. 

## 2013-01-14 NOTE — Progress Notes (Signed)
    Clinical Summary Ms. Mcallister is a 79 y.o.female last seen in September. She reports no worsening palpitations, no chest pain. States that she has been taking her medications regularly.  Record review finds recent mechanical fall without syncope, needed sutures to forehead. She was seen in the ER. Lab work showed potassium 3.8, BUN 19, creatinine 1.3 which was stable. She tells me that she tripped over something in her bedroom and fell toward the wall. Typically uses a walker.  Allergies  Allergen Reactions  . Amoxicillin Rash    rash    Current Outpatient Prescriptions  Medication Sig Dispense Refill  . acetaminophen (TYLENOL ARTHRITIS PAIN) 650 MG CR tablet Take 650 mg by mouth as needed.      Marland Kitchen aspirin 81 MG tablet Take 81 mg by mouth daily.        . citalopram (CELEXA) 10 MG tablet Take 10 mg by mouth daily.      Marland Kitchen diltiazem (CARDIZEM SR) 60 MG 12 hr capsule TAKE (1) CAPSULE BY MOUTH TWICE DAILY.  60 capsule  6  . hydrochlorothiazide (MICROZIDE) 12.5 MG capsule Take 12.5 mg by mouth daily.      . Ibandronate Sodium (BONIVA IV) Inject into the vein every 3 (three) months.       . metoprolol (TOPROL-XL) 100 MG 24 hr tablet Take 100 mg by mouth daily.        . Multiple Vitamins-Minerals (ICAPS) CAPS Take 1 capsule by mouth daily.        . pantoprazole (PROTONIX) 40 MG tablet Take 40 mg by mouth daily.        . ramipril (ALTACE) 5 MG capsule TAKE ONE CAPSULE DAILY.  30 capsule  3   No current facility-administered medications for this visit.    Past Medical History  Diagnosis Date  . Essential hypertension, benign   . Palpitations   . Lumbar disc disease   . Osteoporosis   . GERD (gastroesophageal reflux disease)   . Anxiety   . History of DVT (deep vein thrombosis)   . Renal insufficiency   . Macular degeneration   . Uterine prolapse   . FH: osteoporosis 10/31/2010    Social History Ms. Devincentis reports that she has quit smoking. Her smoking use included Cigarettes. She  smoked 0.00 packs per day. She has never used smokeless tobacco. Ms. Dacruz reports that she does not drink alcohol.  Review of Systems As outlined above.  Physical Examination Filed Vitals:   01/14/13 1258  BP: 138/52  Pulse: 66   Filed Weights   01/14/13 1258  Weight: 150 lb (68.04 kg)    Elderly woman, comfortable at rest.  HEENT: Ecchymoses resolving on face and forehead Steri-Strips on forehead and resolving hematoma, oropharynx with moist mucosa.  Neck: Supple, no elevated JVP or loud bruits, no thyromegaly.  Lungs: Clear to auscultation, nonlabored.  Cardiac: Regular rate and rhythm, soft systolic murmur or S3 gallop.  Skin: Warm and dry.  Extremities: No significant pitting edema, trace ankle edema noted.   Problem List and Plan   Palpitations Remains controlled on current doses of Cardizem SR and Toprol-XL.  Essential hypertension, benign Continue current regimen. She reports followup with Dr. Ouida Sills earlier today.    Jonelle Sidle, M.D., F.A.C.C.

## 2013-03-31 ENCOUNTER — Encounter (HOSPITAL_COMMUNITY)
Admission: RE | Admit: 2013-03-31 | Discharge: 2013-03-31 | Disposition: A | Discharge status: Home / Self Care | Payer: Medicare Other | Source: Ambulatory Visit | Attending: Internal Medicine | Admitting: Internal Medicine

## 2013-03-31 DIAGNOSIS — M810 Age-related osteoporosis without current pathological fracture: Secondary | ICD-10-CM | POA: Insufficient documentation

## 2013-03-31 LAB — RENAL FUNCTION PANEL
Albumin: 3.1 g/dL — ABNORMAL LOW (ref 3.5–5.2)
BUN: 11 mg/dL (ref 6–23)
CALCIUM: 9.1 mg/dL (ref 8.4–10.5)
CO2: 28 meq/L (ref 19–32)
CREATININE: 1.11 mg/dL — AB (ref 0.50–1.10)
Chloride: 102 mEq/L (ref 96–112)
GFR calc Af Amer: 48 mL/min — ABNORMAL LOW (ref >90)
GFR calc non Af Amer: 41 mL/min — ABNORMAL LOW (ref >90)
Glucose, Bld: 93 mg/dL (ref 70–99)
Phosphorus: 3.8 mg/dL (ref 2.3–4.6)
Potassium: 3.9 mEq/L (ref 3.7–5.3)
Sodium: 140 mEq/L (ref 137–147)

## 2013-03-31 MED ORDER — IBANDRONATE SODIUM 3 MG/3ML IV SOLN
3.0000 mg | Freq: Once | INTRAVENOUS | Status: AC
  Administered 2013-03-31: 3 mg via INTRAVENOUS

## 2013-03-31 MED ORDER — SODIUM CHLORIDE 0.9 % IJ SOLN
10.0000 mL | INTRAMUSCULAR | Status: AC | PRN
  Administered 2013-03-31: 10 mL

## 2013-03-31 MED ORDER — IBANDRONATE SODIUM 3 MG/3ML IV SOLN
INTRAVENOUS | Status: AC
  Filled 2013-03-31: qty 3

## 2013-04-01 NOTE — Progress Notes (Signed)
Results for [name], Subrena O (MRN [id number]) as of 04/01/2013 09:13  Ref. Range 03/31/2013 13:10  Sodium Latest Range: 137-147 mEq/L 140  Potassium Latest Range: 3.7-5.3 mEq/L 3.9  Chloride Latest Range: 96-112 mEq/L 102  CO2 Latest Range: 19-32 mEq/L 28  BUN Latest Range: 6-23 mg/dL 11  Creatinine Latest Range: 0.50-1.10 mg/dL 4.091.11 (H)  Calcium Latest Range: 8.4-10.5 mg/dL 9.1  GFR calc non Af Amer Latest Range: >90 mL/min 41 (L)  GFR calc Af Amer Latest Range: >90 mL/min 48 (L)  Glucose Latest Range: 70-99 mg/dL 93  Phosphorus Latest Range: 2.3-4.6 mg/dL 3.8  Albumin Latest Range: 3.5-5.2 g/dL 3.1 (L)  Protocol renal panel for Bon[facility].  Will need renewal order [facility]

## 2013-04-02 ENCOUNTER — Ambulatory Visit (INDEPENDENT_AMBULATORY_CARE_PROVIDER_SITE_OTHER): Payer: Medicare Other | Admitting: Cardiology

## 2013-04-02 ENCOUNTER — Encounter: Payer: Self-pay | Admitting: Cardiology

## 2013-04-02 VITALS — BP 107/63 | HR 65 | Ht 60.0 in | Wt 152.0 lb

## 2013-04-02 DIAGNOSIS — I10 Essential (primary) hypertension: Secondary | ICD-10-CM

## 2013-04-02 DIAGNOSIS — R002 Palpitations: Secondary | ICD-10-CM

## 2013-04-02 NOTE — Assessment & Plan Note (Signed)
Intermittent, no obvious precipitant. Not associated with dizziness or syncope. Continue current regimen. Heart rate and blood pressure are normal today.

## 2013-04-02 NOTE — Patient Instructions (Signed)
Your physician recommends that you schedule a follow-up appointment in: 3 MONTHS WITH DR MCDOWELL   

## 2013-04-02 NOTE — Progress Notes (Signed)
    Clinical Summary Kristin Rubio is a 80 y.o.female last seen in December 2014. She reports an episode of palVickki Rubio over the weekend, approximately one week ago without obvious precipitant. She has had no further symptoms since then. No chest pain or dizziness. She reports compliance with her usual medications. Otherwise no change in functional status. Remains mentally alert, place bridge regularly with a group of friends. She is not driving, has someone to help her with chores and errands.  Recent lab work showed potassium 3.9, BUN 11, creatinine 1.1.   Allergies  Allergen Reactions  . Amoxicillin Rash    rash    Current Outpatient Prescriptions  Medication Sig Dispense Refill  . acetaminophen (TYLENOL ARTHRITIS PAIN) 650 MG CR tablet Take 650 mg by mouth as needed.      Marland Kitchen. aspirin 81 MG tablet Take 81 mg by mouth daily.        . Bisacodyl (DULCOLAX PO) Take 3 tablets by mouth daily.      . citalopram (CELEXA) 10 MG tablet Take 10 mg by mouth daily.      Marland Kitchen. diltiazem (CARDIZEM SR) 60 MG 12 hr capsule TAKE (1) CAPSULE BY MOUTH TWICE DAILY.  60 capsule  6  . hydrochlorothiazide (MICROZIDE) 12.5 MG capsule Take 12.5 mg by mouth daily.      . Ibandronate Sodium (BONIVA IV) Inject into the vein every 3 (three) months.       . metoprolol (TOPROL-XL) 100 MG 24 hr tablet Take 100 mg by mouth daily.        . Multiple Vitamins-Minerals (ICAPS) CAPS Take 1 capsule by mouth daily.        . pantoprazole (PROTONIX) 40 MG tablet Take 40 mg by mouth daily.        . ramipril (ALTACE) 5 MG capsule TAKE ONE CAPSULE DAILY.  30 capsule  3   No current facility-administered medications for this visit.    Past Medical History  Diagnosis Date  . Essential hypertension, benign   . Palpitations   . Lumbar disc disease   . Osteoporosis   . GERD (gastroesophageal reflux disease)   . Anxiety   . History of DVT (deep vein thrombosis)   . Renal insufficiency   . Macular degeneration   . Uterine prolapse     . FH: osteoporosis 10/31/2010    Social History [name] reports that she has quit smoking. Her smoking use included Cigarettes. She smoked 0.00 packs per day. She has never used smokeless tobacco. [name] reports that she does not drink alcohol.  Review of Systems Otherwise negative.  Physical Examination Filed Vitals:   04/02/13 1315  BP: 107/63  Pulse: 65   Filed Weights   04/02/13 1315  Weight: 152 lb (68.947 kg)    Elderly woman, comfortable at rest.  HEENT: Conjunctiva and lids normal, oropharynx with moist mucosa.  Neck: Supple, no elevated JVP or loud bruits, no thyromegaly.  Lungs: Clear to auscultation, nonlabored.  Cardiac: Regular rate and rhythm, 2/6 systolic murmur or S3 gallop.  Skin: Warm and dry.  Extremities: No significant pitting edema, trace ankle edema noted.   Problem List and Plan   Palpitations Intermittent, no obvious precipitant. Not associated with dizziness or syncope. Continue current regimen. Heart rate and blood pressure are normal today.  Essential hypertension, benign No change to current regimen. Keep follow up with Dr. Ouida Rubio.    Kristin SidleSamuel G. Rubio, M.D., F.A.C.C.

## 2013-04-02 NOTE — Assessment & Plan Note (Signed)
No change to current regimen. Keep follow up with Dr. Ouida SillsFagan.

## 2013-04-17 ENCOUNTER — Encounter: Payer: Self-pay | Admitting: Adult Health

## 2013-04-17 ENCOUNTER — Ambulatory Visit (INDEPENDENT_AMBULATORY_CARE_PROVIDER_SITE_OTHER): Payer: Medicare Other | Admitting: Adult Health

## 2013-04-17 VITALS — BP 116/54 | HR 64 | Ht 60.0 in | Wt 148.0 lb

## 2013-04-17 DIAGNOSIS — I10 Essential (primary) hypertension: Secondary | ICD-10-CM

## 2013-04-17 DIAGNOSIS — R002 Palpitations: Secondary | ICD-10-CM

## 2013-04-17 MED ORDER — MAGNESIUM 200 MG PO TABS: 200.0000 | ORAL_TABLET | Freq: Every day | ORAL | Status: DC

## 2013-04-17 NOTE — Progress Notes (Deleted)
Name: Kristin Rubio    DOB: 05-01-1917  Age: 80 y.o.  MR#: [id number]       PCP:  Carylon Perches, MD      Insurance: Payor: [employer] MEDICARE / Plan: AARP MEDICARE COMPLETE / Product Type: *No Product type* /   CC:    Chief Complaint  Patient presents with  . Palpitations    VS Filed Vitals:   04/17/13 1317  BP: 116/54  Pulse: 64  Height: 5' (1.524 m)  Weight: 148 lb (67.132 kg)    Weights Current Weight  04/17/13 148 lb (67.132 kg)  04/02/13 152 lb (68.947 kg)  03/31/13 148 lb (67.132 kg)    Blood Pressure  BP Readings from Last 3 Encounters:  04/17/13 116/54  04/02/13 107/63  03/31/13 107/60     Admit date:  (Not on file) Last encounter with RMR:  12/16/2012   Allergy Amoxicillin  Current Outpatient Prescriptions  Medication Sig Dispense Refill  . acetaminophen (TYLENOL ARTHRITIS PAIN) 650 MG CR tablet Take 650 mg by mouth as needed.      Marland Kitchen aspirin 81 MG tablet Take 81 mg by mouth daily.        . Bisacodyl (DULCOLAX PO) Take 3 tablets by mouth daily.      . citalopram (CELEXA) 10 MG tablet Take 10 mg by mouth daily.      Marland Kitchen diltiazem (CARDIZEM SR) 60 MG 12 hr capsule TAKE (1) CAPSULE BY MOUTH TWICE DAILY.  60 capsule  6  . hydrochlorothiazide (MICROZIDE) 12.5 MG capsule Take 12.5 mg by mouth daily.      . Ibandronate Sodium (BONIVA IV) Inject into the vein every 3 (three) months.       . metoprolol (TOPROL-XL) 100 MG 24 hr tablet Take 100 mg by mouth daily.        . Multiple Vitamins-Minerals (ICAPS) CAPS Take 1 capsule by mouth daily.        . pantoprazole (PROTONIX) 40 MG tablet Take 40 mg by mouth daily.        . ramipril (ALTACE) 5 MG capsule TAKE ONE CAPSULE DAILY.  30 capsule  3   No current facility-administered medications for this visit.    Discontinued Meds:   There are no discontinued medications.  Patient Active Problem List   Diagnosis Date Noted  . Osteoporosis 09/29/2011  . Essential hypertension, benign 11/15/2009  . Palpitations 11/15/2009     LABS    Component Value Date/Time   NA 140 03/31/2013 1310   NA 137 12/30/2012 1305   NA 136 09/27/2012 1350   K 3.9 03/31/2013 1310   K 3.8 12/30/2012 1305   K 3.7 09/27/2012 1350   CL 102 03/31/2013 1310   CL 100 12/30/2012 1305   CL 98 09/27/2012 1350   CO2 28 03/31/2013 1310   CO2 27 12/30/2012 1305   CO2 29 09/27/2012 1350   GLUCOSE 93 03/31/2013 1310   GLUCOSE 84 12/30/2012 1305   GLUCOSE 95 09/27/2012 1350   BUN 11 03/31/2013 1310   BUN 19 12/30/2012 1305   BUN 17 09/27/2012 1350   CREATININE 1.11* 03/31/2013 1310   CREATININE 1.37* 12/30/2012 1305   CREATININE 1.37* 09/27/2012 1350   CALCIUM 9.1 03/31/2013 1310   CALCIUM 9.4 12/30/2012 1305   CALCIUM 9.6 09/27/2012 1350   GFRNONAA 41* 03/31/2013 1310   GFRNONAA 32* 12/30/2012 1305   GFRNONAA 32* 09/27/2012 1350   GFRAA 48* 03/31/2013 1310   GFRAA 37* 12/30/2012 1305   GFRAA  37* 09/27/2012 1350   CMP     Component Value Date/Time   NA 140 03/31/2013 1310   K 3.9 03/31/2013 1310   CL 102 03/31/2013 1310   CO2 28 03/31/2013 1310   GLUCOSE 93 03/31/2013 1310   BUN 11 03/31/2013 1310   CREATININE 1.11* 03/31/2013 1310   CALCIUM 9.1 03/31/2013 1310   ALBUMIN 3.1* 03/31/2013 1310   GFRNONAA 41* 03/31/2013 1310   GFRAA 48* 03/31/2013 1310       Component Value Date/Time   WBC 5.1 08/12/2007 1207   WBC 6.3 05/30/2007 0450   HGB 11.8* 08/12/2007 1207   HGB 12.3 07/17/2007 1545   HGB 11.6* 05/30/2007 0450   HCT 35.7* 08/12/2007 1207   HCT 37.1 07/17/2007 1545   HCT 33.0* 05/30/2007 0450   MCV 94.2 08/12/2007 1207   MCV 94.2 05/30/2007 0450    Lipid Panel  No results found for this basename: chol, trig, hdl, cholhdl, vldl, ldlcalc    ABG No results found for this basename: phart, pco2, pco2art, po2, po2art, hco3, tco2, acidbasedef, o2sat     No results found for this basename: TSH   BNP (last 3 results) No results found for this basename: PROBNP,  in the last 8760 hours Cardiac Panel (last 3 results) No results found for this  basename: CKTOTAL, CKMB, TROPONINI, RELINDX,  in the last 72 hours  Iron/TIBC/Ferritin No results found for this basename: iron, tibc, ferritin     EKG Orders placed in visit on 03/26/12  . EKG 12-LEAD     Prior Assessment and Plan Problem List as of 04/17/2013     Cardiovascular and Mediastinum   Essential hypertension, benign   Last Assessment & Plan   04/02/2013 Office Visit Written 04/02/2013  1:31 PM by Jonelle SidleSamuel G McDowell, MD     No change to current regimen. Keep follow up with Dr. Ouida SillsFagan.      Musculoskeletal and Integument   Osteoporosis     Other   Palpitations   Last Assessment & Plan   04/02/2013 Office Visit Written 04/02/2013  1:31 PM by Jonelle SidleSamuel G McDowell, MD     Intermittent, no obvious precipitant. Not associated with dizziness or syncope. Continue current regimen. Heart rate and blood pressure are normal today.        Imaging: No results found.

## 2013-04-17 NOTE — Assessment & Plan Note (Signed)
BP is low normal. She is on diltiazem for ongoing treatment of this. She is advised that she may experience mild ankle edema on this medication and can take HCTZ prn for this if it becomes bothersome to her.

## 2013-04-17 NOTE — Progress Notes (Signed)
    HPI: [name] is a 80 year old patient of Dr. Diona BrownerMcDowell we are following for complaints of palpitations. She was recently seen in the office on 04/02/2013 by Dr. Diona BrownerMcDowell with continued complaints. At that time the patient's medications were unchanged. She was offered reassurance.    She is having more palpitations again, and has become concerned about them.   Allergies  Allergen Reactions  . Amoxicillin Rash    rash    Current Outpatient Prescriptions  Medication Sig Dispense Refill  . acetaminophen (TYLENOL ARTHRITIS PAIN) 650 MG CR tablet Take 650 mg by mouth as needed.      Marland Kitchen. aspirin 81 MG tablet Take 81 mg by mouth daily.        . Bisacodyl (DULCOLAX PO) Take 3 tablets by mouth daily.      . citalopram (CELEXA) 10 MG tablet Take 10 mg by mouth daily.      Marland Kitchen. diltiazem (CARDIZEM SR) 60 MG 12 hr capsule TAKE (1) CAPSULE BY MOUTH TWICE DAILY.  60 capsule  6  . hydrochlorothiazide (MICROZIDE) 12.5 MG capsule Take 12.5 mg by mouth daily.      . Ibandronate Sodium (BONIVA IV) Inject into the vein every 3 (three) months.       . metoprolol (TOPROL-XL) 100 MG 24 hr tablet Take 100 mg by mouth daily.        . Multiple Vitamins-Minerals (ICAPS) CAPS Take 1 capsule by mouth daily.        . pantoprazole (PROTONIX) 40 MG tablet Take 40 mg by mouth daily.        . ramipril (ALTACE) 5 MG capsule TAKE ONE CAPSULE DAILY.  30 capsule  3   No current facility-administered medications for this visit.    Past Medical History  Diagnosis Date  . Essential hypertension, benign   . Palpitations   . Lumbar disc disease   . Osteoporosis   . GERD (gastroesophageal reflux disease)   . Anxiety   . History of DVT (deep vein thrombosis)   . Renal insufficiency   . Macular degeneration   . Uterine prolapse   . FH: osteoporosis 10/31/2010    Past Surgical History  Procedure Laterality Date  . Cholecystectomy    . Knee arthroscopy      Right  . Cervical polypectomy      Excision of interest  cervical/ endometrial lesion  . Finger ganglion cyst excision      Mucoid cyst, left middle finger  . Vitrectomy      Partial, left eye    ROS:  Review of systems complete and found to be negative unless listed above  PHYSICAL EXAM BP 116/54  Pulse 64  Ht 5' (1.524 m)  Wt 148 lb (67.132 kg)  BMI 28.90 kg/m2 General: Well developed, well nourished, in no acute distress Head: Eyes PERRLA, No xanthomas.   Normal cephalic and atramatic  Lungs: Clear bilaterally to auscultation and percussion. Heart: HRRR S1 S2, without MRG.  Pulses are 2+ & equal.            No carotid bruit. No JVD.  No abdominal bruits. No femoral bruits. Abdomen: Bowel sounds are positive, abdomen soft and non-tender without masses or  Hernia's noted. Msk:  Back normal, normal gait. Normal strength and tone for age. Extremities: No clubbing, cyanosis or edema.  DP +1 Neuro: Alert and oriented X 3. Psych:  Good affect, responds appropriately     ASSESSMENT AND PLAN

## 2013-04-17 NOTE — Patient Instructions (Signed)
Your physician recommends that you schedule a follow-up appointment as Dr.McDowell as planned     Your physician has recommended you make the following change in your medication:    START Magnesium 200 mg daily  STOP HCTZ, you may use it needed    Thank you for choosing [facility] !          Thank you for choosing [facility] !

## 2013-04-17 NOTE — Assessment & Plan Note (Signed)
She is on protonix which can reduce magnesium stores, causing palpitations, and also on HCTZ. I will stop the HCTZ as she is low normal on BP, and have her use it prn. I will start low dose magnesium 200 mg daily. Hopefully, this will help with her symptoms. She is to keep her follow up appt with Dr. Diona BrownerMcDowell as scheduled.

## 2013-05-14 ENCOUNTER — Encounter (HOSPITAL_COMMUNITY): Payer: Self-pay | Admitting: Emergency Medicine

## 2013-05-14 ENCOUNTER — Emergency Department (HOSPITAL_COMMUNITY)
Admission: EM | Admit: 2013-05-14 | Discharge: 2013-05-14 | Disposition: A | Discharge status: Home / Self Care | Payer: Medicare Other | Attending: Emergency Medicine | Admitting: Emergency Medicine

## 2013-05-14 ENCOUNTER — Emergency Department (HOSPITAL_COMMUNITY): Payer: Medicare Other

## 2013-05-14 ENCOUNTER — Other Ambulatory Visit: Payer: Self-pay | Admitting: Adult Health

## 2013-05-14 DIAGNOSIS — Z8669 Personal history of other diseases of the nervous system and sense organs: Secondary | ICD-10-CM | POA: Insufficient documentation

## 2013-05-14 DIAGNOSIS — Z79899 Other long term (current) drug therapy: Secondary | ICD-10-CM | POA: Insufficient documentation

## 2013-05-14 DIAGNOSIS — Z8742 Personal history of other diseases of the female genital tract: Secondary | ICD-10-CM | POA: Insufficient documentation

## 2013-05-14 DIAGNOSIS — M5126 Other intervertebral disc displacement, lumbar region: Secondary | ICD-10-CM | POA: Insufficient documentation

## 2013-05-14 DIAGNOSIS — I10 Essential (primary) hypertension: Secondary | ICD-10-CM | POA: Insufficient documentation

## 2013-05-14 DIAGNOSIS — F411 Generalized anxiety disorder: Secondary | ICD-10-CM | POA: Insufficient documentation

## 2013-05-14 DIAGNOSIS — Z7982 Long term (current) use of aspirin: Secondary | ICD-10-CM | POA: Insufficient documentation

## 2013-05-14 DIAGNOSIS — K219 Gastro-esophageal reflux disease without esophagitis: Secondary | ICD-10-CM | POA: Insufficient documentation

## 2013-05-14 DIAGNOSIS — Z880 Allergy status to penicillin: Secondary | ICD-10-CM | POA: Insufficient documentation

## 2013-05-14 DIAGNOSIS — M810 Age-related osteoporosis without current pathological fracture: Secondary | ICD-10-CM | POA: Insufficient documentation

## 2013-05-14 DIAGNOSIS — Z86718 Personal history of other venous thrombosis and embolism: Secondary | ICD-10-CM | POA: Insufficient documentation

## 2013-05-14 DIAGNOSIS — R42 Dizziness and giddiness: Secondary | ICD-10-CM

## 2013-05-14 DIAGNOSIS — Z87448 Personal history of other diseases of urinary system: Secondary | ICD-10-CM | POA: Insufficient documentation

## 2013-05-14 DIAGNOSIS — Z87891 Personal history of nicotine dependence: Secondary | ICD-10-CM | POA: Insufficient documentation

## 2013-05-14 LAB — CBC WITH DIFFERENTIAL/PLATELET
BASOS ABS: 0 10*3/uL (ref 0.0–0.1)
Basophils Relative: 1 % (ref 0–1)
Eosinophils Absolute: 0.2 10*3/uL (ref 0.0–0.7)
Eosinophils Relative: 5 % (ref 0–5)
HCT: 36.2 % (ref 36.0–46.0)
Hemoglobin: 11.8 g/dL — ABNORMAL LOW (ref 12.0–15.0)
LYMPHS PCT: 31 % (ref 12–46)
Lymphs Abs: 1.3 10*3/uL (ref 0.7–4.0)
MCH: 30.6 pg (ref 26.0–34.0)
MCHC: 32.6 g/dL (ref 30.0–36.0)
MCV: 94 fL (ref 78.0–100.0)
Monocytes Absolute: 0.3 10*3/uL (ref 0.1–1.0)
Monocytes Relative: 8 % (ref 3–12)
NEUTROS ABS: 2.3 10*3/uL (ref 1.7–7.7)
NEUTROS PCT: 55 % (ref 43–77)
Platelets: 179 10*3/uL (ref 150–400)
RBC: 3.85 MIL/uL — ABNORMAL LOW (ref 3.87–5.11)
RDW: 14.1 % (ref 11.5–15.5)
WBC: 4.1 10*3/uL (ref 4.0–10.5)

## 2013-05-14 LAB — COMPREHENSIVE METABOLIC PANEL
ALK PHOS: 66 U/L (ref 39–117)
ALT: 6 U/L (ref 0–35)
AST: 14 U/L (ref 0–37)
Albumin: 3.4 g/dL — ABNORMAL LOW (ref 3.5–5.2)
BUN: 13 mg/dL (ref 6–23)
CO2: 30 mEq/L (ref 19–32)
Calcium: 9.2 mg/dL (ref 8.4–10.5)
Chloride: 102 mEq/L (ref 96–112)
Creatinine, Ser: 1.21 mg/dL — ABNORMAL HIGH (ref 0.50–1.10)
GFR, EST AFRICAN AMERICAN: 43 mL/min — AB (ref >90)
GFR, EST NON AFRICAN AMERICAN: 37 mL/min — AB (ref >90)
GLUCOSE: 101 mg/dL — AB (ref 70–99)
Potassium: 4.2 mEq/L (ref 3.7–5.3)
Sodium: 142 mEq/L (ref 137–147)
TOTAL PROTEIN: 6.8 g/dL (ref 6.0–8.3)
Total Bilirubin: 0.4 mg/dL (ref 0.3–1.2)

## 2013-05-14 LAB — URINALYSIS, ROUTINE W REFLEX MICROSCOPIC
Bilirubin Urine: NEGATIVE
Glucose, UA: NEGATIVE mg/dL
Ketones, ur: NEGATIVE mg/dL
NITRITE: NEGATIVE
Protein, ur: NEGATIVE mg/dL
Urobilinogen, UA: 0.2 mg/dL (ref 0.0–1.0)
pH: 6 (ref 5.0–8.0)

## 2013-05-14 LAB — TROPONIN I: Troponin I: <0.3 ng/mL (ref <0.30)

## 2013-05-14 LAB — URINE MICROSCOPIC-ADD ON

## 2013-05-14 LAB — PRO B NATRIURETIC PEPTIDE: Pro B Natriuretic peptide (BNP): 618.1 pg/mL — ABNORMAL HIGH (ref 0–450)

## 2013-05-14 MED ORDER — SODIUM CHLORIDE 0.9 % IV SOLN
INTRAVENOUS | Status: DC
  Administered 2013-05-14: 14:00:00 via INTRAVENOUS

## 2013-05-14 NOTE — ED Notes (Signed)
Patient to MRI at this time.

## 2013-05-14 NOTE — ED Notes (Signed)
Patient with no complaints at this time. Respirations even and unlabored. Skin warm/dry. Discharge instructions reviewed with patient at this time. Patient given opportunity to voice concerns/ask questions. IV removed per policy and band-aid applied to site. Patient discharged at this time and left Emergency Department with steady gait.  

## 2013-05-14 NOTE — ED Provider Notes (Addendum)
CSN: [id number]     Arrival date & time 05/14/13  1032 History  This chart was scribed for Shelda JakesScott W. Jenesa Foresta, MD by Leone PayorSonum Patel, ED Scribe. This patient was seen in room APA02/APA02 and the patient's care was started 12:19 PM.    Chief Complaint  Patient presents with  . Dizziness      The history is provided by the patient. No language interpreter was used.    HPI Comments: Kristin NordmannSara O Rubio is a 80 y.o. female who presents to the Emergency Department complaining of dizziness that began last night. She describes it as unsteadiness while walking and denies any vertigo symptoms. She denies similar symptoms in the past. She denies slurred speech, weakness, numbness. She is able to walk but requires a walker. She denies recent URI symptoms.   Past Medical History  Diagnosis Date  . Essential hypertension, benign   . Palpitations   . Lumbar disc disease   . Osteoporosis   . GERD (gastroesophageal reflux disease)   . Anxiety   . History of DVT (deep vein thrombosis)   . Renal insufficiency   . Macular degeneration   . Uterine prolapse   . FH: osteoporosis 10/31/2010   Past Surgical History  Procedure Laterality Date  . Cholecystectomy    . Knee arthroscopy      Right  . Cervical polypectomy      Excision of interest cervical/ endometrial lesion  . Finger ganglion cyst excision      Mucoid cyst, left middle finger  . Vitrectomy      Partial, left eye   Family History  Problem Relation Age of Onset  . Cancer Paternal Aunt   . Stroke Paternal Uncle    History  Substance Use Topics  . Smoking status: Former Smoker    Types: Cigarettes  . Smokeless tobacco: Never Used  . Alcohol Use: No   OB History   Grav Para Term Preterm Abortions TAB SAB Ect Mult Living                 Review of Systems  Constitutional: Negative for fever and chills.  HENT: Negative for rhinorrhea and sore throat.   Eyes: Negative for visual disturbance.  Respiratory: Negative for cough and  shortness of breath.   Cardiovascular: Negative for chest pain and leg swelling.  Gastrointestinal: Negative for nausea, vomiting, abdominal pain and diarrhea.  Genitourinary: Negative for dysuria.  Musculoskeletal: Negative for back pain and neck pain.  Skin: Negative for rash.  Neurological: Positive for dizziness. Negative for speech difficulty, weakness, numbness and headaches.  Hematological: Does not bruise/bleed easily.  Psychiatric/Behavioral: Negative for confusion.      Allergies  Amoxicillin  Home Medications   Prior to Admission medications   Medication Sig Start Date End Date Taking? Authorizing Provider  aspirin 325 MG tablet Take 325 mg by mouth daily.   Yes Historical Provider, MD  Bisacodyl (DULCOLAX PO) Take 1 tablet by mouth 3 (three) times daily.    Yes Historical Provider, MD  calcium citrate (CALCITRATE - DOSED IN MG ELEMENTAL CALCIUM) 950 MG tablet Take 200 mg of elemental calcium by mouth daily.   Yes Historical Provider, MD  citalopram (CELEXA) 10 MG tablet Take 10 mg by mouth daily.   Yes Historical Provider, MD  diltiazem (CARDIZEM SR) 60 MG 12 hr capsule TAKE (1) CAPSULE BY MOUTH TWICE DAILY. 11/25/12  Yes Jonelle SidleSamuel G McDowell, MD  hydrochlorothiazide (MICROZIDE) 12.5 MG capsule Take 12.5 mg by mouth daily.  Yes Historical Provider, MD  Ibandronate Sodium (BONIVA IV) Inject into the vein every 3 (three) months.    Yes Historical Provider, MD  metoprolol (TOPROL-XL) 100 MG 24 hr tablet Take 100 mg by mouth daily.     Yes Historical Provider, MD  Multiple Vitamins-Minerals (ICAPS) CAPS Take 1 capsule by mouth daily.     Yes Historical Provider, MD  pantoprazole (PROTONIX) 40 MG tablet Take 40 mg by mouth daily.     Yes Historical Provider, MD  ramipril (ALTACE) 5 MG capsule TAKE ONE CAPSULE DAILY. 12/16/12  Yes Jonelle Sidle, MD   BP 188/71  Pulse 62  Temp(Src) 98.5 F (36.9 C) (Oral)  Resp 15  Ht 5' (1.524 m)  Wt 148 lb (67.132 kg)  BMI 28.90 kg/m2   SpO2 96% Physical Exam  Nursing note and vitals reviewed. Constitutional: She is oriented to person, place, and time. She appears well-developed and well-nourished.  HENT:  Head: Normocephalic and atraumatic.  Mouth/Throat: Oropharynx is clear and moist.  Eyes: EOM are normal.  Cardiovascular: Normal rate and normal heart sounds.  An irregular rhythm present.  No murmur heard. Pulmonary/Chest: Effort normal and breath sounds normal. No respiratory distress.  Abdominal: Soft. Bowel sounds are normal. She exhibits no distension. There is no tenderness.  Musculoskeletal: Normal range of motion. She exhibits no edema.  Neurological: She is alert and oriented to person, place, and time. No cranial nerve deficit.  Skin: Skin is warm and dry.  Psychiatric: She has a normal mood and affect.    ED Course  Procedures (including critical care time)  DIAGNOSTIC STUDIES: Oxygen Saturation is 95% on RA, adequate by my interpretation.    COORDINATION OF CARE: 4:01 PM Discussed treatment plan with pt at bedside and pt agreed to plan.   Labs Review Labs Reviewed  COMPREHENSIVE METABOLIC PANEL - Abnormal; Notable for the following:    Glucose, Bld 101 (*)    Creatinine, Ser 1.21 (*)    Albumin 3.4 (*)    GFR calc non Af Amer 37 (*)    GFR calc Af Amer 43 (*)    All other components within normal limits  CBC WITH DIFFERENTIAL - Abnormal; Notable for the following:    RBC 3.85 (*)    Hemoglobin 11.8 (*)    All other components within normal limits  PRO B NATRIURETIC PEPTIDE - Abnormal; Notable for the following:    Pro B Natriuretic peptide (BNP) 618.1 (*)    All other components within normal limits  URINALYSIS, ROUTINE W REFLEX MICROSCOPIC - Abnormal; Notable for the following:    Specific Gravity, Urine <1.005 (*)    Hgb urine dipstick TRACE (*)    Leukocytes, UA SMALL (*)    All other components within normal limits  URINE MICROSCOPIC-ADD ON - Abnormal; Notable for the following:     Squamous Epithelial / LPF MANY (*)    Bacteria, UA MANY (*)    All other components within normal limits  TROPONIN I   Results for orders placed during the hospital encounter of 05/14/13  TROPONIN I      Result Value Ref Range   Troponin I <0.30  <0.30 ng/mL  COMPREHENSIVE METABOLIC PANEL      Result Value Ref Range   Sodium 142  137 - 147 mEq/L   Potassium 4.2  3.7 - 5.3 mEq/L   Chloride 102  96 - 112 mEq/L   CO2 30  19 - 32 mEq/L   Glucose, Bld  101 (*) 70 - 99 mg/dL   BUN 13  6 - 23 mg/dL   Creatinine, Ser 1.61 (*) 0.50 - 1.10 mg/dL   Calcium 9.2  8.4 - 09.6 mg/dL   Total Protein 6.8  6.0 - 8.3 g/dL   Albumin 3.4 (*) 3.5 - 5.2 g/dL   AST 14  0 - 37 U/L   ALT 6  0 - 35 U/L   Alkaline Phosphatase 66  39 - 117 U/L   Total Bilirubin 0.4  0.3 - 1.2 mg/dL   GFR calc non Af Amer 37 (*) >90 mL/min   GFR calc Af Amer 43 (*) >90 mL/min  CBC WITH DIFFERENTIAL      Result Value Ref Range   WBC 4.1  4.0 - 10.5 K/uL   RBC 3.85 (*) 3.87 - 5.11 MIL/uL   Hemoglobin 11.8 (*) 12.0 - 15.0 g/dL   HCT 04.5  40.9 - 81.1 %   MCV 94.0  78.0 - 100.0 fL   MCH 30.6  26.0 - 34.0 pg   MCHC 32.6  30.0 - 36.0 g/dL   RDW 91.4  78.2 - 95.6 %   Platelets 179  150 - 400 K/uL   Neutrophils Relative % 55  43 - 77 %   Neutro Abs 2.3  1.7 - 7.7 K/uL   Lymphocytes Relative 31  12 - 46 %   Lymphs Abs 1.3  0.7 - 4.0 K/uL   Monocytes Relative 8  3 - 12 %   Monocytes Absolute 0.3  0.1 - 1.0 K/uL   Eosinophils Relative 5  0 - 5 %   Eosinophils Absolute 0.2  0.0 - 0.7 K/uL   Basophils Relative 1  0 - 1 %   Basophils Absolute 0.0  0.0 - 0.1 K/uL  PRO B NATRIURETIC PEPTIDE      Result Value Ref Range   Pro B Natriuretic peptide (BNP) 618.1 (*) 0 - 450 pg/mL  URINALYSIS, ROUTINE W REFLEX MICROSCOPIC      Result Value Ref Range   Color, Urine YELLOW  YELLOW   APPearance CLEAR  CLEAR   Specific Gravity, Urine <1.005 (*) 1.005 - 1.030   pH 6.0  5.0 - 8.0   Glucose, UA NEGATIVE  NEGATIVE mg/dL   Hgb urine  dipstick TRACE (*) NEGATIVE   Bilirubin Urine NEGATIVE  NEGATIVE   Ketones, ur NEGATIVE  NEGATIVE mg/dL   Protein, ur NEGATIVE  NEGATIVE mg/dL   Urobilinogen, UA 0.2  0.0 - 1.0 mg/dL   Nitrite NEGATIVE  NEGATIVE   Leukocytes, UA SMALL (*) NEGATIVE  URINE MICROSCOPIC-ADD ON      Result Value Ref Range   Squamous Epithelial / LPF MANY (*) RARE   WBC, UA 7-10  <3 WBC/hpf   RBC / HPF 3-6  <3 RBC/hpf   Bacteria, UA MANY (*) RARE     Imaging Review Dg Chest 1 View  05/14/2013   CLINICAL DATA:  Dizziness and hypertension.  EXAM: CHEST - 1 VIEW  COMPARISON:  12/12/2010  FINDINGS: Cardiac silhouette is upper limits of normal in size. Thoracic aorta is mildly tortuous. Lung volumes are shallower than on the prior study. Increased interstitial markings are again seen diffusely with basilar predominance, similar to the prior study. No confluent airspace opacification, pleural effusion, or pneumothorax is identified. Thoracic S-shaped scoliosis is again seen.  IMPRESSION: Shallower lung volumes with similar appearance of increased interstitial markings, which may reflect underlying interstitial lung disease.   Electronically Signed  By: Sebastian AcheAllen  Grady   On: 05/14/2013 14:26   Ct Head Wo Contrast  05/14/2013   CLINICAL DATA:  Dizziness.  EXAM: CT HEAD WITHOUT CONTRAST  TECHNIQUE: Contiguous axial images were obtained from the base of the skull through the vertex without intravenous contrast.  COMPARISON:  01/04/2013  FINDINGS: There is no evidence of acute cortical infarct, intracranial hemorrhage, mass, midline shift, or extra-axial fluid collection. Periventricular white matter hypodensities do not appear significantly changed and are compatible with moderate chronic small vessel ischemic disease. Age-related cerebral atrophy is unchanged.  Prior bilateral cataract surgery is noted. Mastoid air cells and paranasal sinuses are clear.  IMPRESSION: 1. No evidence of acute intracranial abnormality. 2. Unchanged  chronic small vessel ischemic disease.   Electronically Signed   By: Sebastian AcheAllen  Grady   On: 05/14/2013 14:29   Mr Brain Wo Contrast  05/14/2013   CLINICAL DATA:  80 year old female with dizziness and confusion. Initial encounter.  EXAM: MRI HEAD WITHOUT CONTRAST  TECHNIQUE: Multiplanar, multiecho pulse sequences of the brain and surrounding structures were obtained without intravenous contrast.  COMPARISON:  Head CT 1423 hr the same day, and earlier.  FINDINGS: Cerebral volume is within normal limits for age. No restricted diffusion or evidence of acute infarction. Major intracranial vascular flow voids are preserved.  There is evidence of a small 10 mm oval lesion along the inferior lateral left cerebellum (series 101, image 2, series 6, image 3). Legrand RamsFavor this is a small posterior fossa meningioma (series 9, image 9). No associated mass effect or cerebellar edema.  No intracranial mass effect. No ventriculomegaly. No acute intracranial hemorrhage identified. Negative pituitary, cervicomedullary junction and visualized cervical spine. Normal bone marrow signal.  Patchy and confluent cerebral white matter T2 and FLAIR hyperintensity. Involvement of the deep white matter capsules. Similar moderate T2 heterogeneity of the deep gray matter nuclei, specially the thalami. Similar patchy signal abnormality in the pons. No cerebellar signal abnormality. Visible internal auditory structures appear normal.  Visualized paranasal sinuses and mastoids are clear. Postoperative changes to the globes. Visualized scalp soft tissues are within normal limits.  IMPRESSION: 1. No acute infarct. 2. Moderately advanced signal changes in the brain most suggestive of chronic small vessel disease. 3. Small left cerebellar meningioma suspected, likely inconsequential.   Electronically Signed   By: Augusto GambleLee  Hall M.D.   On: 05/14/2013 15:52     EKG Interpretation None      Date: 05/14/2013  Rate: 63  Rhythm: normal sinus rhythm  QRS Axis:  normal  Intervals: normal  ST/T Wave abnormalities: normal  Conduction Disutrbances:none  Narrative Interpretation:   Old EKG Reviewed: none available   MDM   Final diagnoses:  Dizziness  Hypertension    Patient with the dizziness no true vertigo off balance when walking having to use her walker. Extensive workup here negative to include head CT without any acute findings also MRI head without no evidence of acute infarct. Has some chronic small vessel disease. We'll see how well patient ambulates to determine whether it's safe for her to go home rest of the workup is negative. Patient without any other focal neuro deficits. Patient's primary care doctors Dr. Ouida SillsFagan. No evidence urinary tract infection. No evidence of pneumonia or congestive heart failure. No evidence of an occult MI troponin was negative. EKG without evidence of arrhythmia. No evidence of an acute cardiac event.  In addition patient with hypertension here. History of hypertension patient states that she's on medications for it and that she's  been taken him she's on the cardia exam and she's on hydrochlorothiazide and she's on a beta blocker per her medication list. Patient may have not had some her meds today may explain the high blood pressure here. Would not expect a high blood pressure to be causing the equilibrium problem or the dizziness. However that does need to be followed up with her record Dr. Dr. Ouida Sills in nature that that improves.  I personally performed the services described in this documentation, which was scribed in my presence. The recorded information has been reviewed and is accurate.     Shelda Jakes, MD 05/14/13 1556  Shelda Jakes, MD 05/14/13 [phone]

## 2013-05-14 NOTE — ED Notes (Signed)
Dizziness starting last night, worse with position changes.  Denies weakness/slurred speech/pain.

## 2013-05-14 NOTE — ED Notes (Signed)
MD at bedside. 

## 2013-05-14 NOTE — Discharge Instructions (Signed)
Continue blood pressure medication. Important to give Dr. Alonza SmokerFagan's office a call more for followup for her blood pressure. Family member to stay with you to make sure that she stay on your feet usual GERD all times. Return for any new or worse symptoms. Extensive workup here today to include MRI of the brain without any acute findings.

## 2013-06-28 ENCOUNTER — Other Ambulatory Visit: Payer: Self-pay | Admitting: Cardiology

## 2013-06-29 ENCOUNTER — Encounter: Payer: Self-pay | Admitting: Cardiology

## 2013-06-29 NOTE — Progress Notes (Signed)
Clinical Summary Kristin Rubio is a 80 y.o.female last seen in April of this year by Ms. Lawrence NP.at that time she was reporting or palpitations, was taken off HCTZ and placed on low-dose magnesium supplements - she tells me that she did not make these changes however.. No change made in diltiazem or metoprolol doses. She was subsequently seen in the ER later in April with balance problems and dizziness. Workup showed no acute findings by head MRI, no obvious infection, troponin I negative, no acute arrhythmias. She has been on meclizine per Dr. Ouida SillsFagan since that time and is doing better.  No significant progression of palpitations, no syncope. No chest pain symptoms.  Lab work from April showed potassium 4.2, BUN 13, creatinine 1.2, normal AST and ALT, hemoglobin 11.8, platelets 179. ECG showed normal sinus rhythm.  Allergies  Allergen Reactions  . Amoxicillin Rash    rash    Current Outpatient Prescriptions  Medication Sig Dispense Refill  . aspirin 325 MG tablet Take 325 mg by mouth daily.      . Bisacodyl (DULCOLAX PO) Take 1 tablet by mouth 3 (three) times daily.       . calcium citrate (CALCITRATE - DOSED IN MG ELEMENTAL CALCIUM) 950 MG tablet Take 200 mg of elemental calcium by mouth daily.      . citalopram (CELEXA) 10 MG tablet Take 10 mg by mouth daily.      Marland Kitchen. diltiazem (CARDIZEM SR) 60 MG 12 hr capsule TAKE (1) CAPSULE BY MOUTH TWICE DAILY.  60 capsule  3  . hydrochlorothiazide (MICROZIDE) 12.5 MG capsule Take 12.5 mg by mouth daily.       . Ibandronate Sodium (BONIVA IV) Inject into the vein every 3 (three) months.       . meclizine (ANTIVERT) 12.5 MG tablet Take 12.5 mg by mouth 2 (two) times daily as needed for dizziness.      . metoprolol (TOPROL-XL) 100 MG 24 hr tablet Take 100 mg by mouth daily.        . Multiple Vitamins-Minerals (ICAPS) CAPS Take 1 capsule by mouth daily.        . pantoprazole (PROTONIX) 40 MG tablet Take 40 mg by mouth daily.        . ramipril  (ALTACE) 5 MG capsule TAKE ONE CAPSULE DAILY.  30 capsule  6   No current facility-administered medications for this visit.    Past Medical History  Diagnosis Date  . Essential hypertension, benign   . Palpitations   . Lumbar disc disease   . Osteoporosis   . GERD (gastroesophageal reflux disease)   . Anxiety   . History of DVT (deep vein thrombosis)   . Renal insufficiency   . Macular degeneration   . Uterine prolapse   . FH: osteoporosis 10/31/2010    Social History Kristin Rubio reports that she has quit smoking. Her smoking use included Cigarettes. She smoked 0.00 packs per day. She has never used smokeless tobacco. Kristin Rubio reports that she does not drink alcohol.  Review of Systems As outlined above, otherwise negative.  Physical Examination Filed Vitals:   06/30/13 1054  BP: 130/74  Pulse: 68   Filed Weights   06/30/13 1054  Weight: 150 lb (68.04 kg)    Elderly woman, comfortable at rest.  HEENT: Conjunctiva and lids normal, oropharynx with moist mucosa.  Neck: Supple, no elevated JVP or loud bruits, no thyromegaly.  Lungs: Clear to auscultation, nonlabored.  Cardiac: Regular rate and rhythm, 2/6  systolic murmur or S3 gallop.  Skin: Warm and dry.  Extremities: No significant pitting edema, trace ankle edema noted.   Problem List and Plan   Palpitations Continue current doses of diltiazem and metoprolol. Followup in 3 months.  Essential hypertension, benign Blood pressure control is good today. No changes made.    Jonelle SidleSamuel G. Conna Terada, M.D., F.A.C.C.

## 2013-06-30 ENCOUNTER — Ambulatory Visit (INDEPENDENT_AMBULATORY_CARE_PROVIDER_SITE_OTHER): Payer: Medicare Other | Admitting: Cardiology

## 2013-06-30 ENCOUNTER — Encounter: Payer: Self-pay | Admitting: Cardiology

## 2013-06-30 VITALS — BP 130/74 | HR 68 | Ht 60.0 in | Wt 150.0 lb

## 2013-06-30 DIAGNOSIS — R002 Palpitations: Secondary | ICD-10-CM

## 2013-06-30 DIAGNOSIS — I10 Essential (primary) hypertension: Secondary | ICD-10-CM

## 2013-06-30 NOTE — Assessment & Plan Note (Signed)
Continue current doses of diltiazem and metoprolol. Followup in 3 months.

## 2013-06-30 NOTE — Patient Instructions (Signed)
Your physician recommends that you schedule a follow-up appointment in:  3 months    Your physician recommends that you continue on your current medications as directed. Please refer to the Current Medication list given to you today.     Thank you for choosing [facility] !   

## 2013-06-30 NOTE — Assessment & Plan Note (Signed)
Blood pressure control is good today. No changes made. 

## 2013-07-07 ENCOUNTER — Encounter (HOSPITAL_COMMUNITY)
Admission: RE | Admit: 2013-07-07 | Discharge: 2013-07-07 | Disposition: A | Discharge status: Home / Self Care | Payer: Medicare Other | Source: Ambulatory Visit | Attending: Internal Medicine | Admitting: Internal Medicine

## 2013-07-07 DIAGNOSIS — M810 Age-related osteoporosis without current pathological fracture: Secondary | ICD-10-CM | POA: Insufficient documentation

## 2013-07-07 LAB — RENAL FUNCTION PANEL
Albumin: 3.4 g/dL — ABNORMAL LOW (ref 3.5–5.2)
BUN: 24 mg/dL — ABNORMAL HIGH (ref 6–23)
CALCIUM: 8.8 mg/dL (ref 8.4–10.5)
CO2: 23 meq/L (ref 19–32)
CREATININE: 1.8 mg/dL — AB (ref 0.50–1.10)
Chloride: 100 mEq/L (ref 96–112)
GFR calc Af Amer: 27 mL/min — ABNORMAL LOW (ref >90)
GFR calc non Af Amer: 23 mL/min — ABNORMAL LOW (ref >90)
GLUCOSE: 101 mg/dL — AB (ref 70–99)
Phosphorus: 3.9 mg/dL (ref 2.3–4.6)
Potassium: 4.1 mEq/L (ref 3.7–5.3)
Sodium: 138 mEq/L (ref 137–147)

## 2013-07-07 MED ORDER — IBANDRONATE SODIUM 3 MG/3ML IV SOLN
3.0000 mg | Freq: Once | INTRAVENOUS | Status: DC
  Administered 2013-07-07: 3 mg via INTRAVENOUS

## 2013-07-07 MED ORDER — IBANDRONATE SODIUM 3 MG/3ML IV SOLN
INTRAVENOUS | Status: AC
  Filled 2013-07-07: qty 3

## 2013-07-08 NOTE — Progress Notes (Signed)
Results for [name], [name] (MRN [id number]) as of 07/08/2013 07:44  [facility]   Ref. Range 07/07/2013 14:15  Sodium Latest Range: 137-147 mEq/L 138  Potassium Latest Range: 3.7-5.3 mEq/L 4.1  Chloride Latest Range: 96-112 mEq/L 100  CO2 Latest Range: 19-32 mEq/L 23  BUN Latest Range: 6-23 mg/dL 24 (H)  Creatinine Latest Range: 0.50-1.10 mg/dL 7.821.80 (H)  Calcium Latest Range: 8.4-10.5 mg/dL 8.8  GFR calc non Af Amer Latest Range: >90 mL/min 23 (L)  GFR calc Af Amer Latest Range: >90 mL/min 27 (L)  Glucose Latest Range: 70-99 mg/dL 956101 (H)  Phosphorus Latest Range: 2.3-4.6 mg/dL 3.9  Albumin Latest Range: 3.5-5.2 g/dL 3.4 (L)

## 2013-07-17 ENCOUNTER — Telehealth: Payer: Self-pay | Admitting: *Deleted

## 2013-07-17 MED ORDER — DILTIAZEM HCL ER 60 MG PO CP12: 60.0000 mg | ORAL_CAPSULE | Freq: Two times a day (BID) | ORAL | Status: DC

## 2013-07-17 NOTE — Telephone Encounter (Signed)
Pt stated that the pharmacy would not refill it because it didn't say twice daily. Refilled diltiazem 60 mg twice daily as per her last dr note and medication list. Pt understood

## 2013-07-17 NOTE — Telephone Encounter (Signed)
Pt calling for refill on diltiazem, she ran out early because she has been taking two a day

## 2013-09-15 ENCOUNTER — Other Ambulatory Visit: Payer: Self-pay

## 2013-09-15 ENCOUNTER — Other Ambulatory Visit: Payer: Self-pay | Admitting: Cardiology

## 2013-09-15 ENCOUNTER — Telehealth: Payer: Self-pay

## 2013-09-15 NOTE — Telephone Encounter (Signed)
Refill request complete due to insurance,pt is taking cardizem cd 120 mg qd vs cardizem sr 60 mg bid.Tripp at [facility] states cost to be pt is much cheaper with cardizem version

## 2013-09-29 ENCOUNTER — Encounter (HOSPITAL_COMMUNITY)
Admission: RE | Admit: 2013-09-29 | Discharge: 2013-09-29 | Disposition: A | Discharge status: Home / Self Care | Payer: Medicare Other | Source: Ambulatory Visit | Attending: Internal Medicine | Admitting: Internal Medicine

## 2013-09-29 DIAGNOSIS — M810 Age-related osteoporosis without current pathological fracture: Secondary | ICD-10-CM | POA: Insufficient documentation

## 2013-09-29 MED ORDER — IBANDRONATE SODIUM 3 MG/3ML IV SOLN
3.0000 mg | Freq: Once | INTRAVENOUS | Status: AC
  Administered 2013-09-29: 3 mg via INTRAVENOUS

## 2013-09-29 MED ORDER — SODIUM CHLORIDE 0.9 % IJ SOLN
10.0000 mL | INTRAMUSCULAR | Status: AC | PRN
  Administered 2013-09-29: 10 mL

## 2013-09-29 MED ORDER — IBANDRONATE SODIUM 3 MG/3ML IV SOLN
INTRAVENOUS | Status: AC
  Filled 2013-09-29: qty 3

## 2013-09-29 NOTE — Progress Notes (Signed)
Left middle finger cleansed with water and gauze. No active bleeding. Small amt of vaseline applied. Op-site applied. Extra op-sites sent home with pt. Called and talked with Jewel at Dr Karleen Hampshire office. Pt condition discussed. Instructed to have pt come to the office and she would look at it and let Dr Ouida Sills know. Pt and her driver informed. Voiced understanding.

## 2013-09-29 NOTE — Progress Notes (Signed)
Here for labs and boniva. Dx code osteoporosis. Laceration to left middle finger. States she "sat on her finger this AM and tore the skin to left middle finger. Area without active bleeding. Small hematoma present. Informed pt would put bandage on it before she went home. Voiced understanding.

## 2013-11-11 ENCOUNTER — Telehealth: Payer: Self-pay | Admitting: Cardiology

## 2013-11-11 ENCOUNTER — Ambulatory Visit (INDEPENDENT_AMBULATORY_CARE_PROVIDER_SITE_OTHER): Payer: Medicare Other | Admitting: Cardiology

## 2013-11-11 ENCOUNTER — Encounter: Payer: Self-pay | Admitting: Cardiology

## 2013-11-11 VITALS — BP 150/70 | HR 60 | Ht 60.0 in | Wt 151.0 lb

## 2013-11-11 DIAGNOSIS — I10 Essential (primary) hypertension: Secondary | ICD-10-CM

## 2013-11-11 DIAGNOSIS — R002 Palpitations: Secondary | ICD-10-CM

## 2013-11-11 NOTE — Patient Instructions (Signed)
Your physician recommends that you schedule a follow-up appointment in:  3 months    Your physician recommends that you continue on your current medications as directed. Please refer to the Current Medication list given to you today.     Thank you for choosing [facility] !   

## 2013-11-11 NOTE — Progress Notes (Signed)
Reason for visit: Palpitations, hypertension  Clinical Summary Kristin Rubio is a 80 y.o.female last seen in June. She seems to be doing reasonably well, denies any progressive palpitations, has had no falls using her walker. She states that she tries to get outdoors to walk in the parking lot of her apartment when the weather is good. Other than this, no regular exercise. She does not endorse any exertional chest pain. She reports compliance with her Cardizem CD and Toprol-XL, divides these lunch and evening.  Lab work from June showed potassium 4.1, BUN 24, creatinine 1.8. Looks like there is an order in for have follow-up lab work, possibly per Dr. Willey Blade.  She has a group with which she enjoys playing bridge, they have not met in the last few weeks.  No other change in antihypertensive regimen, systolic blood pressure 466 today.  Allergies  Allergen Reactions  . Amoxicillin Rash    rash    Current Outpatient Prescriptions  Medication Sig Dispense Refill  . AFLURIA SUSP       . aspirin 325 MG tablet Take 325 mg by mouth daily.      . Bisacodyl (DULCOLAX PO) Take 1 tablet by mouth 3 (three) times daily.       . calcium citrate (CALCITRATE - DOSED IN MG ELEMENTAL CALCIUM) 950 MG tablet Take 200 mg of elemental calcium by mouth daily.      . citalopram (CELEXA) 10 MG tablet Take 10 mg by mouth daily.      Marland Kitchen diltiazem (CARDIZEM CD) 120 MG 24 hr capsule TAKE (1) CAPSULE BY MOUTH ONCE DAILY.  30 capsule  6  . hydrochlorothiazide (MICROZIDE) 12.5 MG capsule Take 12.5 mg by mouth daily.       . Ibandronate Sodium (BONIVA IV) Inject into the vein every 3 (three) months.       . metoprolol (TOPROL-XL) 100 MG 24 hr tablet Take 100 mg by mouth daily.        . Multiple Vitamins-Minerals (ICAPS) CAPS Take 1 capsule by mouth daily.        . pantoprazole (PROTONIX) 40 MG tablet Take 40 mg by mouth daily.        . ramipril (ALTACE) 5 MG capsule TAKE ONE CAPSULE DAILY.  30 capsule  6   No current  facility-administered medications for this visit.    Past Medical History  Diagnosis Date  . Essential hypertension, benign   . Palpitations   . Lumbar disc disease   . Osteoporosis   . GERD (gastroesophageal reflux disease)   . Anxiety   . History of DVT (deep vein thrombosis)   . Renal insufficiency   . Macular degeneration   . Uterine prolapse   . FH: osteoporosis 10/31/2010    Social History Kristin Rubio reports that she has quit smoking. Her smoking use included Cigarettes. She smoked 0.00 packs per day. She has never used smokeless tobacco. Kristin Rubio reports that she does not drink alcohol.  Review of Systems Complete review of systems negative except as otherwise outlined in the clinical summary and also the following. Reports stable appetite, no melena or hematochezia. No falls.  Physical Examination Filed Vitals:   11/11/13 1100  BP: 150/70  Pulse: 60   Filed Weights   11/11/13 1100  Weight: 151 lb (68.493 kg)    Elderly woman, comfortable at rest.  HEENT: Conjunctiva and lids normal, oropharynx with moist mucosa.  Neck: Supple, no elevated JVP or loud bruits, no thyromegaly.  Lungs:  Clear to auscultation, nonlabored.  Cardiac: Regular rate and rhythm, 2/6 systolic murmur or S3 gallop.  Skin: Warm and dry.  Extremities: No significant pitting edema, trace ankle edema noted.   Problem List and Plan   Palpitations No escalation in symptoms. Continue Cardizem CD and Toprol-XL.  Essential hypertension, benign Blood pressure 150 today. She continues on Altace and HCTZ addition to Cardizem CD and Toprol-XL. Keep follow-up with Dr. Willey Blade.    Satira Sark, M.D., F.A.C.C.

## 2013-11-11 NOTE — Telephone Encounter (Signed)
Pt went to [facility] and lab orders were not in. Day surgery sent orders to another lab. Gave pt number to call day surgery per they had placed original orders. Pt understood.

## 2013-11-11 NOTE — Assessment & Plan Note (Signed)
Blood pressure 150 today. She continues on Altace and HCTZ addition to Cardizem CD and Toprol-XL. Keep follow-up with Dr. Ouida SillsFagan.

## 2013-11-11 NOTE — Telephone Encounter (Signed)
Please call patient regarding lab work she was to have done today./  tgs

## 2013-11-11 NOTE — Assessment & Plan Note (Signed)
No escalation in symptoms. Continue Cardizem CD and Toprol-XL.

## 2013-12-12 ENCOUNTER — Other Ambulatory Visit: Payer: Self-pay | Admitting: Adult Health

## 2013-12-31 ENCOUNTER — Inpatient Hospital Stay (HOSPITAL_COMMUNITY): Admission: RE | Admit: 2013-12-31 | Payer: Medicare Other | Source: Ambulatory Visit

## 2013-12-31 ENCOUNTER — Encounter (HOSPITAL_COMMUNITY)
Admission: RE | Admit: 2013-12-31 | Discharge: 2013-12-31 | Disposition: A | Discharge status: Home / Self Care | Payer: Medicare Other | Source: Ambulatory Visit | Attending: Internal Medicine | Admitting: Internal Medicine

## 2013-12-31 ENCOUNTER — Encounter (HOSPITAL_COMMUNITY): Payer: Self-pay

## 2013-12-31 DIAGNOSIS — M810 Age-related osteoporosis without current pathological fracture: Secondary | ICD-10-CM | POA: Insufficient documentation

## 2013-12-31 LAB — RENAL FUNCTION PANEL
ALBUMIN: 3.4 g/dL — AB (ref 3.5–5.2)
ANION GAP: 11 (ref 5–15)
BUN: 16 mg/dL (ref 6–23)
CALCIUM: 9 mg/dL (ref 8.4–10.5)
CO2: 29 meq/L (ref 19–32)
CREATININE: 1.27 mg/dL — AB (ref 0.50–1.10)
Chloride: 101 mEq/L (ref 96–112)
GFR calc Af Amer: 41 mL/min — ABNORMAL LOW (ref >90)
GFR calc non Af Amer: 35 mL/min — ABNORMAL LOW (ref >90)
GLUCOSE: 87 mg/dL (ref 70–99)
PHOSPHORUS: 3.7 mg/dL (ref 2.3–4.6)
POTASSIUM: 4.4 meq/L (ref 3.7–5.3)
Sodium: 141 mEq/L (ref 137–147)

## 2013-12-31 MED ORDER — IBANDRONATE SODIUM 3 MG/3ML IV SOLN
INTRAVENOUS | Status: AC
  Filled 2013-12-31: qty 3

## 2013-12-31 MED ORDER — IBANDRONATE SODIUM 3 MG/3ML IV SOLN
3.0000 mg | Freq: Once | INTRAVENOUS | Status: AC
  Administered 2013-12-31: 3 mg via INTRAVENOUS

## 2013-12-31 MED ORDER — SODIUM CHLORIDE 0.9 % IJ SOLN
10.0000 mL | Freq: Once | INTRAMUSCULAR | Status: AC
  Administered 2013-12-31: 10 mL via INTRAVENOUS

## 2014-02-02 ENCOUNTER — Ambulatory Visit (INDEPENDENT_AMBULATORY_CARE_PROVIDER_SITE_OTHER): Payer: Medicare Other | Admitting: Cardiology

## 2014-02-02 ENCOUNTER — Encounter: Payer: Self-pay | Admitting: Cardiology

## 2014-02-02 VITALS — BP 142/62 | HR 65 | Ht 60.0 in | Wt 157.0 lb

## 2014-02-02 DIAGNOSIS — R002 Palpitations: Secondary | ICD-10-CM

## 2014-02-02 DIAGNOSIS — R011 Cardiac murmur, unspecified: Secondary | ICD-10-CM

## 2014-02-02 HISTORY — DX: Cardiac murmur, unspecified: R01.1

## 2014-02-02 NOTE — Assessment & Plan Note (Signed)
Suggestive of aortic stenosis, or at least moderate sclerosis. Not obviously symptom provoking. She has preferred overall conservative management. No recent echocardiogram.

## 2014-02-02 NOTE — Assessment & Plan Note (Signed)
Symptomatically stable on Cardizem CD and Toprol-XL. Continue observation. She prefers three-month follow-up visits.

## 2014-02-02 NOTE — Progress Notes (Signed)
   Reason for visit: Palpitations  Clinical Summary [name] is a 81 y.o.female last seen in October 2015. She presents for a routine visit. Overall has had good control of palpitations on present regimen. Remains active with her ADLs, uses a rolling walker without recent falls. She continues to enjoy playing bridge with some of her friends.  Lab work from December 2015 showed potassium 4.4, BUN 16, creatinine 1.2.  ECG reviewed from last year, sinus rhythm.   Allergies  Allergen Reactions  . Amoxicillin Rash    rash    Current Outpatient Prescriptions  Medication Sig Dispense Refill  . aspirin 325 MG tablet Take 325 mg by mouth daily.    . Bisacodyl (DULCOLAX PO) Take 1 tablet by mouth 3 (three) times daily.     . calcium citrate (CALCITRATE - DOSED IN MG ELEMENTAL CALCIUM) 950 MG tablet Take 200 mg of elemental calcium by mouth daily.    . citalopram (CELEXA) 10 MG tablet Take 10 mg by mouth daily.    Marland Kitchen. diltiazem (CARDIZEM CD) 120 MG 24 hr capsule TAKE (1) CAPSULE BY MOUTH ONCE DAILY. 30 capsule 6  . hydrochlorothiazide (MICROZIDE) 12.5 MG capsule Take 12.5 mg by mouth daily.     . Ibandronate Sodium (BONIVA IV) Inject into the vein every 3 (three) months.     . metoprolol (TOPROL-XL) 100 MG 24 hr tablet Take 100 mg by mouth daily.      . Multiple Vitamins-Minerals (ICAPS) CAPS Take 1 capsule by mouth daily.      . pantoprazole (PROTONIX) 40 MG tablet Take 40 mg by mouth daily.      . ramipril (ALTACE) 5 MG capsule TAKE ONE CAPSULE BY MOUTH ONCE DAILY. 30 capsule 3   No current facility-administered medications for this visit.    Past Medical History  Diagnosis Date  . Essential hypertension, benign   . Palpitations   . Lumbar disc disease   . Osteoporosis   . GERD (gastroesophageal reflux disease)   . Anxiety   . History of DVT (deep vein thrombosis)   . Renal insufficiency   . Macular degeneration   . Uterine prolapse   . FH: osteoporosis 10/31/2010    Social  History [name] reports that she quit smoking about 63 years ago. Her smoking use included Cigarettes. She started smoking about 83 years ago. She smoked 0.10 packs per day. She has never used smokeless tobacco. [name] reports that she does not drink alcohol.  Review of Systems Complete review of systems negative except as otherwise outlined in the clinical summary and also the following.  Physical Examination Filed Vitals:   02/02/14 1333  BP: 142/62  Pulse: 65   Filed Weights   02/02/14 1333  Weight: 157 lb (71.215 kg)    Elderly woman, comfortable at rest.  HEENT: Conjunctiva and lids normal, oropharynx with moist mucosa.  Neck: Supple, no elevated JVP or loud bruits, no thyromegaly.  Lungs: Clear to auscultation, nonlabored.  Cardiac: Regular rate and rhythm, 2-3/6 systolic murmur or S3 gallop.  Skin: Warm and dry.  Extremities: No significant pitting edema, trace ankle edema noted.   Problem List and Plan   Palpitations Symptomatically stable on Cardizem CD and Toprol-XL. Continue observation. She prefers three-month follow-up visits.   Cardiac murmur Suggestive of aortic stenosis, or at least moderate sclerosis. Not obviously symptom provoking. She has preferred overall conservative management. No recent echocardiogram.     Jonelle SidleSamuel G. McDowell, M.D., F.A.C.C.

## 2014-02-02 NOTE — Patient Instructions (Signed)
Your physician recommends that you schedule a follow-up appointment in: 3 MONTHS with Dr.McDowell      Your physician recommends that you continue on your current medications as directed. Please refer to the Current Medication list given to you today.      Thank you for choosing [facility] !

## 2014-02-10 ENCOUNTER — Encounter: Payer: Self-pay | Admitting: Cardiology

## 2014-04-02 ENCOUNTER — Encounter (HOSPITAL_COMMUNITY)
Admission: RE | Admit: 2014-04-02 | Discharge: 2014-04-02 | Disposition: A | Discharge status: Home / Self Care | Payer: Medicare Other | Source: Ambulatory Visit | Attending: Internal Medicine | Admitting: Internal Medicine

## 2014-04-02 ENCOUNTER — Encounter (HOSPITAL_COMMUNITY): Payer: Medicare Other

## 2014-04-02 DIAGNOSIS — M810 Age-related osteoporosis without current pathological fracture: Secondary | ICD-10-CM | POA: Diagnosis not present

## 2014-04-02 LAB — RENAL FUNCTION PANEL
Albumin: 3.7 g/dL (ref 3.5–5.2)
Anion gap: 7 (ref 5–15)
BUN: 15 mg/dL (ref 6–23)
CALCIUM: 9.3 mg/dL (ref 8.4–10.5)
CO2: 28 mmol/L (ref 19–32)
Chloride: 103 mmol/L (ref 96–112)
Creatinine, Ser: 1.2 mg/dL — ABNORMAL HIGH (ref 0.50–1.10)
GFR calc non Af Amer: 37 mL/min — ABNORMAL LOW (ref >90)
GFR, EST AFRICAN AMERICAN: 43 mL/min — AB (ref >90)
GLUCOSE: 89 mg/dL (ref 70–99)
PHOSPHORUS: 4.2 mg/dL (ref 2.3–4.6)
POTASSIUM: 4.4 mmol/L (ref 3.5–5.1)
Sodium: 138 mmol/L (ref 135–145)

## 2014-04-02 MED ORDER — IBANDRONATE SODIUM 3 MG/3ML IV SOLN
3.0000 mg | Freq: Once | INTRAVENOUS | Status: AC
  Administered 2014-04-02: 3 mg via INTRAVENOUS

## 2014-04-02 MED ORDER — IBANDRONATE SODIUM 3 MG/3ML IV SOLN
INTRAVENOUS | Status: AC
  Filled 2014-04-02: qty 3

## 2014-04-06 NOTE — Progress Notes (Signed)
Results for Kristin NordmannYOUNT, [name] (MRN [id number]) as of 04/06/2014 08:06  Ref. Range 04/02/2014 14:10  Sodium Latest Range: 135-145 mmol/L 138  Potassium Latest Range: 3.5-5.1 mmol/L 4.4  Chloride Latest Range: 96-112 mmol/L 103  CO2 Latest Range: 19-32 mmol/L 28  BUN Latest Range: 6-23 mg/dL 15  Creatinine Latest Range: 0.50-1.10 mg/dL 4.091.20 (H)  Calcium Latest Range: 8.4-10.5 mg/dL 9.3  GFR calc non Af Amer Latest Range: >90 mL/min 37 (L)  GFR calc Af Amer Latest Range: >90 mL/min 43 (L)  Glucose Latest Range: 70-99 mg/dL 89  Anion gap Latest Range: 5-15  7  Phosphorus Latest Range: 2.3-4.6 mg/dL 4.2  Albumin Latest Range: 3.5-5.2 g/dL 3.7

## 2014-04-11 ENCOUNTER — Other Ambulatory Visit: Payer: Self-pay | Admitting: Cardiology

## 2014-04-13 ENCOUNTER — Other Ambulatory Visit: Payer: Self-pay | Admitting: Cardiology

## 2014-06-02 ENCOUNTER — Ambulatory Visit (INDEPENDENT_AMBULATORY_CARE_PROVIDER_SITE_OTHER): Payer: Medicare Other | Admitting: Cardiology

## 2014-06-02 ENCOUNTER — Encounter: Payer: Self-pay | Admitting: Cardiology

## 2014-06-02 VITALS — BP 158/68 | HR 70 | Ht 60.0 in | Wt 157.0 lb

## 2014-06-02 DIAGNOSIS — R011 Cardiac murmur, unspecified: Secondary | ICD-10-CM

## 2014-06-02 DIAGNOSIS — R002 Palpitations: Secondary | ICD-10-CM | POA: Diagnosis not present

## 2014-06-02 DIAGNOSIS — I10 Essential (primary) hypertension: Secondary | ICD-10-CM

## 2014-06-02 NOTE — Patient Instructions (Signed)
Your physician recommends that you schedule a follow-up appointment in:  3 months with Dr McDowell     Your physician recommends that you continue on your current medications as directed. Please refer to the Current Medication list given to you today.      Thank you for choosing [facility] !         

## 2014-06-02 NOTE — Progress Notes (Signed)
Cardiology Office Note  Date: 06/02/2014   ID: Kristin NordmannSara O Dorval, DOB 01/18/1917, MRN [id number]  PCP: Carylon PerchesFAGAN,ROY, MD  Primary Cardiologist: Nona DellSamuel Dekayla Prestridge, MD   Chief Complaint  Patient presents with  . Palpitations  . Hypertension    History of Present Illness: Kristin Rubio is a 81 y.o. female last seen in January. She comes in for a follow-up visit. She denies any significant change in overall stamina, has had no progressing palpitations or breathlessness. She denies any falls, uses a rolling walker regularly. She continues to live in her own apartment, has been there for almost 31 years now she says. She remains functional in her ADLs, and plays bridge with a group of friends.  Follow-up ECG today shows normal sinus rhythm. We reviewed her medications which are outlined below. She has had no major changes in cardiac regimen, and continues to follow with Dr. Ouida SillsFagan.   Past Medical History  Diagnosis Date  . Essential hypertension, benign   . Palpitations   . Lumbar disc disease   . Osteoporosis   . GERD (gastroesophageal reflux disease)   . Anxiety   . History of DVT (deep vein thrombosis)   . Renal insufficiency   . Macular degeneration   . Uterine prolapse   . FH: osteoporosis 10/31/2010     Current Outpatient Prescriptions  Medication Sig Dispense Refill  . aspirin 325 MG tablet Take 325 mg by mouth daily.    . Bisacodyl (DULCOLAX PO) Take 1 tablet by mouth 3 (three) times daily.     . calcium citrate (CALCITRATE - DOSED IN MG ELEMENTAL CALCIUM) 950 MG tablet Take 200 mg of elemental calcium by mouth daily.    . citalopram (CELEXA) 10 MG tablet Take 10 mg by mouth daily.    Marland Kitchen. diltiazem (CARDIZEM CD) 120 MG 24 hr capsule TAKE (1) CAPSULE BY MOUTH ONCE DAILY. 30 capsule 6  . hydrochlorothiazide (MICROZIDE) 12.5 MG capsule Take 12.5 mg by mouth daily.     . Ibandronate Sodium (BONIVA IV) Inject into the vein every 3 (three) months.     . metoprolol (TOPROL-XL) 100 MG 24  hr tablet Take 100 mg by mouth daily.      . Multiple Vitamins-Minerals (ICAPS) CAPS Take 1 capsule by mouth daily.      . pantoprazole (PROTONIX) 40 MG tablet Take 40 mg by mouth daily.      . ramipril (ALTACE) 5 MG capsule TAKE ONE CAPSULE BY MOUTH ONCE DAILY. 30 capsule 6   No current facility-administered medications for this visit.    Allergies:  Amoxicillin   Social History: The patient  reports that she quit smoking about 63 years ago. Her smoking use included Cigarettes. She started smoking about 83 years ago. She smoked 0.10 packs per day. She has never used smokeless tobacco. She reports that she does not drink alcohol or use illicit drugs.   ROS:  Please see the history of present illness. Otherwise, complete review of systems is positive for arthritic pains.  All other systems are reviewed and negative.   Physical Exam: VS:  BP 158/68 mmHg  Pulse 70  Ht 5' (1.524 m)  Wt 157 lb (71.215 kg)  BMI 30.66 kg/m2  SpO2 94%, BMI Body mass index is 30.66 kg/(m^2).  Wt Readings from Last 3 Encounters:  06/02/14 157 lb (71.215 kg)  02/02/14 157 lb (71.215 kg)  11/11/13 151 lb (68.493 kg)     Elderly woman, comfortable at rest.  HEENT: Conjunctiva  and lids normal, oropharynx with moist mucosa.  Neck: Supple, no elevated JVP or loud bruits, no thyromegaly.  Lungs: Clear to auscultation, nonlabored.  Cardiac: Regular rate and rhythm, 3/6 systolic murmur or S3 gallop.  Skin: Warm and dry.  Extremities: No significant pitting edema.   ECG: ECG is ordered today and shows normal sinus rhythm.  Recent Labwork: 04/02/2014: BUN 15; Creatinine 1.20*; Potassium 4.4; Sodium 138    ASSESSMENT AND PLAN:  1. History of palpitations, stable on current medical regimen. ECG today shows normal sinus rhythm.  2. Essential hypertension, she continues on stable antihypertensive regimen and follows with Dr. Ouida SillsFagan.  3. Cardiac murmur consistent with aortic stenosis or at least moderate  sclerosis. Stable on examination. We have discussed this over time, and she prefers observation only without further testing at this point.  Current medicines were reviewed at length with the patient today.   Orders Placed This Encounter  Procedures  . EKG 12-Lead    Disposition: FU with me in 3 months.   Signed, Jonelle SidleSamuel G. Earnest Mcgillis, MD, [facility] 06/02/2014 12:00 PM     [facility] [address], [place], [place] [address] Phone: [phone]; Fax: [phone]

## 2014-07-02 ENCOUNTER — Encounter (HOSPITAL_COMMUNITY)
Admission: RE | Admit: 2014-07-02 | Discharge: 2014-07-02 | Disposition: A | Discharge status: Home / Self Care | Payer: Medicare Other | Source: Ambulatory Visit | Attending: Internal Medicine | Admitting: Internal Medicine

## 2014-07-02 DIAGNOSIS — M810 Age-related osteoporosis without current pathological fracture: Secondary | ICD-10-CM | POA: Diagnosis not present

## 2014-07-02 LAB — RENAL FUNCTION PANEL
ALBUMIN: 3.4 g/dL — AB (ref 3.5–5.0)
ANION GAP: 9 (ref 5–15)
BUN: 16 mg/dL (ref 6–20)
CALCIUM: 8.7 mg/dL — AB (ref 8.9–10.3)
CO2: 28 mmol/L (ref 22–32)
Chloride: 100 mmol/L — ABNORMAL LOW (ref 101–111)
Creatinine, Ser: 1.16 mg/dL — ABNORMAL HIGH (ref 0.44–1.00)
GFR calc Af Amer: 45 mL/min — ABNORMAL LOW (ref >60)
GFR calc non Af Amer: 39 mL/min — ABNORMAL LOW (ref >60)
GLUCOSE: 99 mg/dL (ref 65–99)
PHOSPHORUS: 3.7 mg/dL (ref 2.5–4.6)
POTASSIUM: 4.2 mmol/L (ref 3.5–5.1)
SODIUM: 137 mmol/L (ref 135–145)

## 2014-07-02 MED ORDER — IBANDRONATE SODIUM 3 MG/3ML IV SOLN
INTRAVENOUS | Status: AC
  Filled 2014-07-02: qty 3

## 2014-07-02 MED ORDER — IBANDRONATE SODIUM 3 MG/3ML IV SOLN
3.0000 mg | Freq: Once | INTRAVENOUS | Status: AC
  Administered 2014-07-02: 3 mg via INTRAVENOUS

## 2014-07-03 ENCOUNTER — Other Ambulatory Visit (HOSPITAL_COMMUNITY): Payer: Medicare Other

## 2014-07-03 NOTE — Progress Notes (Signed)
Results for Kristin Rubio, Kristin Rubio (MRN [id number]) as of 07/03/2014 09:31  Ref. Range 07/02/2014 14:30  Sodium Latest Ref Range: 135-145 mmol/L 137  Potassium Latest Ref Range: 3.5-5.1 mmol/L 4.2  Chloride Latest Ref Range: 101-111 mmol/L 100 (L)  CO2 Latest Ref Range: 22-32 mmol/L 28  BUN Latest Ref Range: 6-20 mg/dL 16  Creatinine Latest Ref Range: 0.44-1.00 mg/dL 1.16 (H)  Calcium Latest Ref Range: 8.9-10.3 mg/dL 8.7 (L)  EGFR (Non-African Amer.) Latest Ref Range: >60 mL/min 39 (L)  EGFR (African American) Latest Ref Range: >60 mL/min 45 (L)  Glucose Latest Ref Range: 65-99 mg/dL 99  Anion gap Latest Ref Range: 5-15  9  Phosphorus Latest Ref Range: 2.5-4.6 mg/dL 3.7  Albumin Latest Ref Range: 3.5-5.0 g/dL 3.4 (L)

## 2014-07-04 ENCOUNTER — Encounter (HOSPITAL_COMMUNITY): Payer: Self-pay

## 2014-07-04 ENCOUNTER — Emergency Department (HOSPITAL_COMMUNITY)
Admission: EM | Admit: 2014-07-04 | Discharge: 2014-07-04 | Disposition: A | Discharge status: Home / Self Care | Payer: Medicare Other | Attending: Emergency Medicine | Admitting: Emergency Medicine

## 2014-07-04 DIAGNOSIS — K219 Gastro-esophageal reflux disease without esophagitis: Secondary | ICD-10-CM | POA: Insufficient documentation

## 2014-07-04 DIAGNOSIS — Z86718 Personal history of other venous thrombosis and embolism: Secondary | ICD-10-CM | POA: Diagnosis not present

## 2014-07-04 DIAGNOSIS — Z880 Allergy status to penicillin: Secondary | ICD-10-CM | POA: Insufficient documentation

## 2014-07-04 DIAGNOSIS — M810 Age-related osteoporosis without current pathological fracture: Secondary | ICD-10-CM | POA: Diagnosis not present

## 2014-07-04 DIAGNOSIS — N390 Urinary tract infection, site not specified: Secondary | ICD-10-CM | POA: Diagnosis not present

## 2014-07-04 DIAGNOSIS — Z79899 Other long term (current) drug therapy: Secondary | ICD-10-CM | POA: Insufficient documentation

## 2014-07-04 DIAGNOSIS — Z7982 Long term (current) use of aspirin: Secondary | ICD-10-CM | POA: Diagnosis not present

## 2014-07-04 DIAGNOSIS — R339 Retention of urine, unspecified: Secondary | ICD-10-CM | POA: Diagnosis present

## 2014-07-04 DIAGNOSIS — Z87891 Personal history of nicotine dependence: Secondary | ICD-10-CM | POA: Diagnosis not present

## 2014-07-04 DIAGNOSIS — F419 Anxiety disorder, unspecified: Secondary | ICD-10-CM | POA: Insufficient documentation

## 2014-07-04 DIAGNOSIS — Z8669 Personal history of other diseases of the nervous system and sense organs: Secondary | ICD-10-CM | POA: Insufficient documentation

## 2014-07-04 DIAGNOSIS — I10 Essential (primary) hypertension: Secondary | ICD-10-CM | POA: Insufficient documentation

## 2014-07-04 LAB — URINE MICROSCOPIC-ADD ON

## 2014-07-04 LAB — URINALYSIS, ROUTINE W REFLEX MICROSCOPIC
Bilirubin Urine: NEGATIVE
Glucose, UA: NEGATIVE mg/dL
Ketones, ur: NEGATIVE mg/dL
NITRITE: NEGATIVE
PROTEIN: NEGATIVE mg/dL
Specific Gravity, Urine: 1.01 (ref 1.005–1.030)
Urobilinogen, UA: 0.2 mg/dL (ref 0.0–1.0)
pH: 6.5 (ref 5.0–8.0)

## 2014-07-04 MED ORDER — NITROFURANTOIN MONOHYD MACRO 100 MG PO CAPS
100.0000 mg | ORAL_CAPSULE | ORAL | Status: AC
  Administered 2014-07-04: 100 mg via ORAL
  Filled 2014-07-04: qty 1

## 2014-07-04 MED ORDER — NITROFURANTOIN MONOHYD MACRO 100 MG PO CAPS: 100.0000 mg | ORAL_CAPSULE | Freq: Two times a day (BID) | ORAL | Status: DC

## 2014-07-04 NOTE — ED Provider Notes (Signed)
CSN: [id number]     Arrival date & time 07/04/14  0808 History   First MD Initiated Contact with Patient 07/04/14 0831     Chief Complaint  Patient presents with  . Urinary Retention      HPI Pt was seen at 0835. Per pt, c/o gradual onset and persistence of constant urinary frequency and urgency for the past 1 day. Pt states she was "up and down all night having to go to the bathroom."  Denies flank pain, no fevers, no abd pain, no N/V/D, no hematuria, no vaginal bleeding/discharge, no rash.   Past Medical History  Diagnosis Date  . Essential hypertension, benign   . Palpitations   . Lumbar disc disease   . Osteoporosis   . GERD (gastroesophageal reflux disease)   . Anxiety   . History of DVT (deep vein thrombosis)   . Renal insufficiency   . Macular degeneration   . Uterine prolapse   . FH: osteoporosis 10/31/2010   Past Surgical History  Procedure Laterality Date  . Cholecystectomy    . Knee arthroscopy      Right  . Cervical polypectomy      Excision of interest cervical/ endometrial lesion  . Finger ganglion cyst excision      Mucoid cyst, left middle finger  . Vitrectomy      Partial, left eye   Family History  Problem Relation Age of Onset  . Cancer Paternal Aunt   . Stroke Paternal Uncle    History  Substance Use Topics  . Smoking status: Former Smoker -- 0.10 packs/day    Types: Cigarettes    Start date: 02/03/1931    Quit date: 02/03/1951  . Smokeless tobacco: Never Used  . Alcohol Use: No    Review of Systems ROS: Statement: All systems negative except as marked or noted in the HPI; Constitutional: Negative for fever and chills. ; ; Eyes: Negative for eye pain, redness and discharge. ; ; ENMT: Negative for ear pain, hoarseness, nasal congestion, sinus pressure and sore throat. ; ; Cardiovascular: Negative for chest pain, palpitations, diaphoresis, dyspnea and peripheral edema. ; ; Respiratory: Negative for cough, wheezing and stridor. ; ;  Gastrointestinal: Negative for nausea, vomiting, diarrhea, abdominal pain, blood in stool, hematemesis, jaundice and rectal bleeding. . ; ; Genitourinary: +urinary frequency and urgency. Negative for dysuria, flank pain and hematuria. ; ; Musculoskeletal: Negative for back pain and neck pain. Negative for swelling and trauma.; ; Skin: Negative for pruritus, rash, abrasions, blisters, bruising and skin lesion.; ; Neuro: Negative for headache, lightheadedness and neck stiffness. Negative for weakness, altered level of consciousness , altered mental status, extremity weakness, paresthesias, involuntary movement, seizure and syncope.      Allergies  Amoxicillin  Home Medications   Prior to Admission medications   Medication Sig Start Date End Date Taking? Authorizing Provider  aspirin EC 81 MG tablet Take 81 mg by mouth daily.   Yes Historical Provider, MD  Bisacodyl (DULCOLAX PO) Take 1 tablet by mouth 3 (three) times daily.    Yes Historical Provider, MD  calcium citrate (CALCITRATE - DOSED IN MG ELEMENTAL CALCIUM) 950 MG tablet Take 200 mg of elemental calcium by mouth daily.   Yes Historical Provider, MD  citalopram (CELEXA) 10 MG tablet Take 10 mg by mouth daily.   Yes Historical Provider, MD  diltiazem (CARDIZEM CD) 120 MG 24 hr capsule TAKE (1) CAPSULE BY MOUTH ONCE DAILY. 04/13/14  Yes Jonelle Sidle, MD  hydrochlorothiazide (MICROZIDE) 12.5  MG capsule Take 12.5 mg by mouth daily.    Yes Historical Provider, MD  Ibandronate Sodium (BONIVA IV) Inject into the vein every 3 (three) months.    Yes Historical Provider, MD  metoprolol (TOPROL-XL) 100 MG 24 hr tablet Take 100 mg by mouth daily.     Yes Historical Provider, MD  Multiple Vitamins-Minerals (ICAPS) CAPS Take 1 capsule by mouth daily.     Yes Historical Provider, MD  pantoprazole (PROTONIX) 40 MG tablet Take 40 mg by mouth daily.     Yes Historical Provider, MD  ramipril (ALTACE) 5 MG capsule TAKE ONE CAPSULE BY MOUTH ONCE DAILY.  04/13/14  Yes Jonelle Sidle, MD   BP 167/61 mmHg  Pulse 72  Temp(Src) 97.9 F (36.6 C) (Oral)  Resp 16  Ht 5' (1.524 m)  Wt 155 lb (70.308 kg)  BMI 30.27 kg/m2  SpO2 96% Physical Exam  0840: Physical examination:  Nursing notes reviewed; Vital signs and O2 SAT reviewed;  Constitutional: Well developed, Well nourished, Well hydrated, In no acute distress; Head:  Normocephalic, atraumatic; Eyes: EOMI, PERRL, No scleral icterus; ENMT: Mouth and pharynx normal, Mucous membranes moist; Neck: Supple, Full range of motion, No lymphadenopathy; Cardiovascular: Regular rate and rhythm, No gallop; Respiratory: Breath sounds clear & equal bilaterally, No wheezes.  Speaking full sentences with ease, Normal respiratory effort/excursion; Chest: Nontender, Movement normal; Abdomen: Soft, Nontender, Nondistended, Normal bowel sounds; Genitourinary: No CVA tenderness; Extremities: Pulses normal, No tenderness, No edema, No calf edema or asymmetry.; Neuro: AA&Ox3, Major CN grossly intact.  Speech clear. No gross focal motor or sensory deficits in extremities. Walks with walker, gait steady.; Skin: Color normal, Warm, Dry.   ED Course  Procedures     EKG Interpretation None      MDM  MDM Reviewed: previous chart, nursing note and vitals Interpretation: labs      Results for orders placed or performed during the hospital encounter of 07/04/14  Urinalysis, Routine w reflex microscopic (not at [facility])  Result Value Ref Range   Color, Urine YELLOW YELLOW   APPearance CLOUDY (A) CLEAR   Specific Gravity, Urine 1.010 1.005 - 1.030   pH 6.5 5.0 - 8.0   Glucose, UA NEGATIVE NEGATIVE mg/dL   Hgb urine dipstick SMALL (A) NEGATIVE   Bilirubin Urine NEGATIVE NEGATIVE   Ketones, ur NEGATIVE NEGATIVE mg/dL   Protein, ur NEGATIVE NEGATIVE mg/dL   Urobilinogen, UA 0.2 0.0 - 1.0 mg/dL   Nitrite NEGATIVE NEGATIVE   Leukocytes, UA LARGE (A) NEGATIVE  Urine microscopic-add on  Result Value Ref Range    Squamous Epithelial / LPF FEW (A) RARE   WBC, UA 21-50 <3 WBC/hpf   RBC / HPF 3-6 <3 RBC/hpf   Bacteria, UA MANY (A) RARE    0855:  Pt has tol PO well while in the ED without N/V.  Pt has ambulated with her walker with steady gait, easy resps, NAD.  Abd remains benign, VSS. Pt wants to go home now. Tx for UTI with macrobid; UC is pending. Dx and testing d/w pt.  Questions answered.  Verb understanding, agreeable to d/c home with outpt f/u.      Samuel Jester, DO 07/07/14 [phone]

## 2014-07-04 NOTE — Discharge Instructions (Signed)
°Emergency Department Resource Guide °1) Find a Doctor and Pay Out of Pocket °Although you won't have to find out who is covered by your insurance plan, it is a good idea to ask around and get recommendations. You will then need to call the office and see if the doctor you have chosen will accept you as a new patient and what types of options they offer for patients who are self-pay. Some doctors offer discounts or will set up payment plans for their patients who do not have insurance, but you will need to ask so you aren't surprised when you get to your appointment. ° °2) Contact Your Local Health Department °Not all health departments have doctors that can see patients for sick visits, but many do, so it is worth a call to see if yours does. If you don't know where your local health department is, you can check in your phone book. The CDC also has a tool to help you locate your state's health department, and many state websites also have listings of all of their local health departments. ° °3) Find a Walk-in Clinic °If your illness is not likely to be very severe or complicated, you may want to try a walk in clinic. These are popping up all over the country in pharmacies, drugstores, and shopping centers. They're usually staffed by nurse practitioners or physician assistants that have been trained to treat common illnesses and complaints. They're usually fairly quick and inexpensive. However, if you have serious medical issues or chronic medical problems, these are probably not your best option. ° °No Primary Care Doctor: °- Call [facility] at  832-8000 - they can help you locate a primary care doctor that  accepts your insurance, provides certain services, etc. °- Physician Referral Service- 1-800-533-3463 ° °Chronic Pain Problems: °Organization         Address  Phone   Notes  °[facility]  (336) 297-2271 Patients need to be referred by their primary care doctor.  ° °Medication  Assistance: °Organization         Address  Phone   Notes  °[place] [facility] [address] °[place], [place] [address] (336) 641-8030 --Must be a resident of [place] °-- Must have NO insurance coverage whatsoever (no Medicaid/ Medicare, etc.) °-- The pt. MUST have a primary care doctor that directs their care regularly and follows them in the community °  °[facility]  (866) 331-1348   °[employer]  (888) 892-1162   ° °Agencies that provide inexpensive medical care: °Organization         Address  Phone   Notes  °[facility]  (336) 832-8035   °[facility]    (336) 832-7272   °[facility] [address] °[place], [place] [address] (336) 832-4777   °[facility] [address], °[place] (336) 271-4999   °[facility]    (336) 373-0678   °[facility]    (336) 272-1050   °[facility] ° [address], [place] Phone:  (336) 832-4444, Fax:  (336) 832-4440 Hours of Operation:  9 am - 6 pm, M-F.  Also accepts Medicaid/Medicare and self-pay.  °[facility] ° [address],  Phone: (336) 832-3150, Fax: (336) [phone]. Hours of Operation:  8:30 am - 5:30 pm, M-F.  Also accepts Medicaid and self-pay.  °[facility] [place] [address], [place] Phone: (336) 878-6027   °[facility] [address], [place], [place] (336)723-1848, Ext. 123 Mondays & Thursdays: 7-9 AM.  First 15 patients are seen on a first come, first serve basis. °  ° °Medicaid-accepting [place] Providers: ° °Organization         Address  Phone   Notes  °[facility] [address], [place] (336) 641-2100 Also accepts self-pay patients.  °[facility] [address], [place] ° (336) 856-9996   °[facility] [address], [place]  (336) 288-8857   °[facility] [address] [place] [address], [place] (336) 299-7000   °Veita Bland [address], [place]  ° (336) 373-1557 Only accepts [place] Access Medicaid patients after they have their name applied to their card.  ° °Self-Pay (no insurance) in [place]: ° °Organization         Address  Phone   Notes  °Sickle Cell Patients, [facility] [address], [place] (336) 832-1970   °[facility] [address], [place] (336) 832-4400   °[facility] [place] ° [address], [place] (336) 992-4800   °[facility]/Dr. Osei-Bonsu ° [address] [place] [address], [place] or [address], [place] (336) 841-8500 Phone number for both [place] and [place] locations is the same.  °[facility] [address], [place] (336) 299-0000   °[facility] [address] [place] [address], [place] or [address] (336) 852-7530 °(336) 878-2260   °[facility] [address], [place] (336) 350-1642, phone; (336) 294-5005, fax Sees patients 1st and 3rd Saturday of every month.  Must not qualify for public or private insurance (i.e. Medicaid, Medicare, [place] Health Choice, Veterans' Benefits) • Household income should be no more than 200% of the poverty level •The clinic cannot treat you if you are pregnant or think you are pregnant • Sexually transmitted diseases are not treated at the clinic.  ° ° °Dental Care: °Organization         Address  Phone  Notes  °[place] [facility] [address], [place] (336) 641-6152 Accepts children up to age 21 who are enrolled in Medicaid or [place] Health Choice; pregnant women with a Medicaid card; and children who have applied for Medicaid or [place] Health Choice, but were declined, whose parents can pay a reduced fee at time of service.  °[place]  [facility] [place]  [address], [place] (336) 641-7733 Accepts children up to age 21 who are enrolled in Medicaid or [place] Health Choice; pregnant women with a Medicaid card; and children who have applied for Medicaid or  Health Choice, but were declined, whose parents can pay a reduced fee at time of service.  °[facility] ° [address],  (336) 641-4533 Patients are seen by appointment only. Walk-ins are not accepted. [facility] will see patients 18 years of age and older. °Monday - Tuesday (8am-5pm) °Most Wednesdays (8:30-5pm) °$30 per visit, cash only  °[facility] ° [address], [place] (336) 641-4533 Patients are seen by appointment only. Walk-ins are not accepted. [facility] will see patients 18 years of age and older. °One   Wednesday Evening (Monthly: Volunteer Based).  $30 per visit, cash only  °[facility]  (919) 537-3737 for adults; Children under age 4, call Graduate Pediatric Dentistry at ([phone]) [phone]. Children aged 4-14, please call (919) [phone] to request a pediatric application. ° Dental services are provided in all areas of dental care including fillings, crowns and bridges, complete and partial dentures, implants, gum treatment, root canals, and extractions. Preventive care is also provided. Treatment is provided to both adults and children. °Patients are selected via a lottery and there is often a waiting list. °  °[facility] [address], °[place] ° (336) 763-8833 [email or web address] °  °[facility] [address], [place], [place] (336)723-1848, Ext. 123 Second and Fourth Thursday of each month, opens at 6:30 AM; Clinic ends at 9 AM.  Patients are seen on a first-come first-served basis, and a limited number are seen during each clinic.  ° °[facility] ° [address], [place], [place] (336) [phone]    Eligibility Requirements °You must have lived in [place], [place], or [place] counties for at least the last three months. °  You cannot be eligible for state or federal sponsored healthcare insurance, including Veterans Administration, Medicaid, or Medicare. °  You generally cannot be eligible for healthcare insurance through your employer.  °  How to apply: °Eligibility screenings are held every Tuesday and Wednesday afternoon from 1:00 pm until 4:00 pm. You do not need an appointment for the interview!  °[facility] [address], [place], [place] 336-631-2330   °[facility] [facility]  336-342-8273   °[facility]  336-703-3100   °[facility]  336-570-6415   ° °Behavioral Health Resources in the Community: °Intensive Outpatient Programs °Organization         Address  Phone  Notes  °[facility] [address], [place], [place] 336-878-6098   °[facility] [address], [place], [place] 336-832-9800   °[facility] [address], [place], [place] ° 336-882-2125   °[facility] [address],  °[place], [place] 1-800-853-5163 or 336-641-4981   °Substance Abuse Resources °Organization         Address  Phone  Notes  °[facility]  336-882-2125   °[facility]  336-784-9470   °[facility]  336-285-9073   °[facility]  336-845-3988   °Residential & Outpatient Substance Abuse Program  1-800-659-3381   °Psychological Services °Organization         Address  Phone  Notes  °[facility]  336- 832-9600   °[facility]  336- 378-7881   °[facility] [address], [place] 1-800-853-5163 or 336-641-4981   ° °Mobile Crisis Teams °Organization         Address  Phone  Notes  °[facility], Mobile Crisis Care Unit  1-877-626-1772   °Assertive °[facility] ° [address]  [place], [place] 336-834-9664   °Sharon DeEsch [address] °[place] 336-554-5454   ° °Self-Help/Support Groups °Organization         Address  Phone             Notes  °[facility] [place] - variety of support groups  336- 373-1402 Call for more information  °Narcotics Anonymous (NA), [facility] [address], °[place]  2 meetings at this location  ° °  Residential Treatment Programs °Organization         Address  Phone  Notes  °[facility] [address],    °[place]  1-866-801-8205   °[facility] ° [address], [place], [place] 704-293-8524   °[facility] [address], [place] 336-845-3988 Admissions: 8am-3pm M-F  °[facility] [address]    °[place], [place] 336-841-1104   °[facility] [address], [place], [place] 336-379-7146   °[facility] [address]  °[place], [place] 336-285-9073   °[facility] Intensive Outpatient [address], [place], [place] 336-852-3033   °[facility] [address]  °[place], [place] 1-877-615-2722 or 336-784-9470   °[facility] [address] [place], [place] 336-227-7417 Accepts Medicaid  °[facility] [address]  °[place] 1-800-659-3381 Substance Abuse/Addiction Treatment  ° °[place] Behavioral Health Resources °Organization         Address  Phone  Notes  °[facility]  (888) 581-9988   °Julie Brannon, PhD [address] [place], [place]   (336) 349-5553 or (336) 951-0000   °[facility]   [address] °[place], [place] (336) 349-4454   °[facility] [address], [place], [place] (336) 342-8316 Insurance/Medicaid/sponsorship through [facility]  °[facility] [address]                                    [place], [place] (336) 342-8316 Therapy/tele-psych/case    °[facility] [address]  ° [place], [place] (336) 349-2233    °Dr. Arfeen  (336) 349-4544   °[facility] [place]  [employer] [place] [facility] 1) [address], [place] °2) [address], [place] °3)  [address], [place] (336) 349-3220 °(336) 342-7768 ° °(336) 342-8140   °[place] Child Abuse [facility] (336) 342-1394 or (336) 342-3537 (After Hours)    ° ° ° °Take the prescription as directed.  Call your regular medical doctor on Monday to schedule a follow up appointment within the next 2 days.  Return to the Emergency Department immediately sooner if worsening.  ° °

## 2014-07-04 NOTE — ED Notes (Signed)
Pt reports that she has been having difficulty voiding for a while ,but worse last night. She not not feel like she can empty bladder. Denies burning with urination

## 2014-07-04 NOTE — ED Notes (Signed)
Urine collected via clean catch. Urine cloudy and foul smelling.

## 2014-07-06 LAB — URINE CULTURE

## 2014-07-07 ENCOUNTER — Telehealth (HOSPITAL_BASED_OUTPATIENT_CLINIC_OR_DEPARTMENT_OTHER): Payer: Self-pay | Admitting: Emergency Medicine

## 2014-07-07 NOTE — Telephone Encounter (Signed)
Post ED Visit - Positive Culture Follow-up: Successful Patient Follow-Up  Culture assessed and recommendations reviewed by: []  Celedonio Miyamoto, Pharm.D., BCPS-AQ ID []  Georgina Pillion, Pharm.D., BCPS []  [place], [place].D., BCPS, AAHIVP [x]  Estella Husk, Pharm.D., BCPS, AAHIVP []  Tegan Magsam, Pharm.D. []  Tennis Must, Pharm.D.  Positive urine culture Nitrofurantoin  []  Patient discharged without antimicrobial prescription and treatment is now indicated [x]  Organism is resistant to prescribed ED discharge antimicrobial []  Patient with positive blood cultures  Changes discussed with ED provider: Danelle Berry PA New antibiotic prescription Levaquin 250mg  po daily x 7 days Called to [facility] @ [phone]  Contacted patient, 07/07/14 1614   Kristin Rubio 07/07/2014, 4:13 PM

## 2014-07-07 NOTE — Progress Notes (Signed)
ED Antimicrobial Stewardship Positive Culture Follow Up   Kristin Rubio is an 81 y.o. female who presented to [facility] on 07/04/2014 with a chief complaint of  Chief Complaint  Patient presents with  . Urinary Retention    Recent Results (from the past 720 hour(s))  Urine culture     Status: None   Collection Time: 07/04/14  8:22 AM  Result Value Ref Range Status   Specimen Description URINE, CLEAN CATCH  Final   Special Requests NONE  Final   Culture   Final    >=100,000 COLONIES/mL VIRIDANS STREPTOCOCCUS Performed at [facility]    Report Status 07/06/2014 FINAL  Final    [x]  Treated with Macrobid, organism resistant to prescribed antimicrobial  New antibiotic prescription: Levaquin 250mg  PO qday x 7 days.  Patient should stop Macrobid.  ED Provider: Danelle Berry, PA-C   Sallee Provencal 07/07/2014, 9:57 AM Infectious Diseases Pharmacist Phone# [phone]

## 2014-09-02 ENCOUNTER — Encounter: Payer: Self-pay | Admitting: Cardiology

## 2014-09-02 ENCOUNTER — Ambulatory Visit (INDEPENDENT_AMBULATORY_CARE_PROVIDER_SITE_OTHER): Payer: Medicare Other | Admitting: Cardiology

## 2014-09-02 VITALS — BP 144/76 | HR 87 | Ht 60.0 in | Wt 156.0 lb

## 2014-09-02 DIAGNOSIS — I10 Essential (primary) hypertension: Secondary | ICD-10-CM | POA: Diagnosis not present

## 2014-09-02 DIAGNOSIS — R002 Palpitations: Secondary | ICD-10-CM

## 2014-09-02 DIAGNOSIS — R011 Cardiac murmur, unspecified: Secondary | ICD-10-CM | POA: Diagnosis not present

## 2014-09-02 NOTE — Progress Notes (Signed)
Cardiology Office Note  Date: 09/02/2014   ID: Kristin Rubio, DOB [birth year], MRN [id number]  PCP: Carylon Perches, MD  Primary Cardiologist: Nona Dell, MD   Chief Complaint  Patient presents with  . Palpitations  . Hypertension    History of Present Illness: Kristin Rubio is a 81 y.o. female last seen in May. She comes in for a routine visit. Other than a feeling of "forceful" heartbeat the other morning when she woke up, she has not had any progressive sense of palpitations. No chest pain or syncope. She continues to function in her basic ADLs, lives in her own apartment. She has not been walking outdoors during the high heat and humidity, sometimes goes up to the mall to walk.  There has been no significant change in her cardiac regimen. We continue to follow her conservatively.   Past Medical History  Diagnosis Date  . Essential hypertension, benign   . Palpitations   . Lumbar disc disease   . Osteoporosis   . GERD (gastroesophageal reflux disease)   . Anxiety   . History of DVT (deep vein thrombosis)   . Renal insufficiency   . Macular degeneration   . Uterine prolapse   . FH: osteoporosis 10/31/2010    Current Outpatient Prescriptions  Medication Sig Dispense Refill  . aspirin 325 MG EC tablet Take 325 mg by mouth daily.    . Bisacodyl (DULCOLAX PO) Take 1 tablet by mouth 3 (three) times daily.     . citalopram (CELEXA) 10 MG tablet Take 10 mg by mouth daily.    Marland Kitchen diltiazem (CARDIZEM CD) 120 MG 24 hr capsule TAKE (1) CAPSULE BY MOUTH ONCE DAILY. 30 capsule 6  . hydrochlorothiazide (MICROZIDE) 12.5 MG capsule Take 12.5 mg by mouth daily.     . Ibandronate Sodium (BONIVA IV) Inject into the vein every 3 (three) months.     . metoprolol (TOPROL-XL) 100 MG 24 hr tablet Take 100 mg by mouth daily.      . Multiple Vitamins-Minerals (ICAPS) CAPS Take 1 capsule by mouth daily.      . pantoprazole (PROTONIX) 40 MG tablet Take 40 mg by mouth daily.      . ramipril (ALTACE)  5 MG capsule TAKE ONE CAPSULE BY MOUTH ONCE DAILY. 30 capsule 6   No current facility-administered medications for this visit.    Allergies:  Amoxicillin   Social History: The patient  reports that she quit smoking about 63 years ago. Her smoking use included Cigarettes. She started smoking about 83 years ago. She smoked 0.10 packs per day. She has never used smokeless tobacco. She reports that she does not drink alcohol or use illicit drugs.    ROS:  Please see the history of present illness. Otherwise, complete review of systems is positive for decreased hearing, arthritic pains.  All other systems are reviewed and negative.   Physical Exam: VS:  BP 144/76 mmHg  Pulse 87  Ht 5' (1.524 m)  Wt 156 lb (70.761 kg)  BMI 30.47 kg/m2, BMI Body mass index is 30.47 kg/(m^2).  Wt Readings from Last 3 Encounters:  09/02/14 156 lb (70.761 kg)  07/04/14 155 lb (70.308 kg)  06/02/14 157 lb (71.215 kg)    Elderly woman, comfortable at rest.  HEENT: Conjunctiva and lids normal, oropharynx with moist mucosa.  Neck: Supple, no elevated JVP or loud bruits, no thyromegaly.  Lungs: Clear to auscultation, nonlabored.  Cardiac: Regular rate and rhythm, 3/6 systolic murmur or S3 gallop.  Skin: Warm and dry.  Extremities: No significant pitting edema. Musculoskeletal: Kyphosis noted.   ECG: ECG is not ordered today.   ASSESSMENT AND PLAN:  1. History of palpitations, no recent arrhythmias documented. Plan is to continue observation on medical therapy. She has tolerated calcium channel blocker and beta blocker over time.  2. Cardiac murmur consistent with aortic valve disease. Examination is stable. We have discussed this and she continues to prefer conservative management.  Current medicines were reviewed at length with the patient today.  Disposition: FU with me in 3 months.   Signed, Jonelle Sidle, MD, [facility] 09/02/2014 11:16 AM     [facility] at [facility] [address] [place], [place] [address] Phone: [phone]; Fax: [phone]

## 2014-09-02 NOTE — Patient Instructions (Signed)
Your physician recommends that you schedule a follow-up appointment in:  3 months with Dr McDowell     Your physician recommends that you continue on your current medications as directed. Please refer to the Current Medication list given to you today.      Thank you for choosing [facility] !         

## 2014-10-05 ENCOUNTER — Encounter (HOSPITAL_COMMUNITY): Payer: Medicare Other

## 2014-10-05 ENCOUNTER — Encounter (HOSPITAL_COMMUNITY): Admission: RE | Admit: 2014-10-05 | Payer: Medicare Other | Source: Ambulatory Visit

## 2014-10-05 ENCOUNTER — Other Ambulatory Visit: Payer: Self-pay | Admitting: Cardiology

## 2014-10-07 ENCOUNTER — Encounter (HOSPITAL_COMMUNITY): Payer: Self-pay

## 2014-10-07 ENCOUNTER — Encounter (HOSPITAL_COMMUNITY)
Admission: RE | Admit: 2014-10-07 | Discharge: 2014-10-07 | Disposition: A | Discharge status: Home / Self Care | Payer: Medicare Other | Source: Ambulatory Visit | Attending: Internal Medicine | Admitting: Internal Medicine

## 2014-10-07 DIAGNOSIS — M810 Age-related osteoporosis without current pathological fracture: Secondary | ICD-10-CM | POA: Insufficient documentation

## 2014-10-07 LAB — RENAL FUNCTION PANEL
ANION GAP: 8 (ref 5–15)
Albumin: 3.7 g/dL (ref 3.5–5.0)
BUN: 18 mg/dL (ref 6–20)
CO2: 26 mmol/L (ref 22–32)
Calcium: 8.6 mg/dL — ABNORMAL LOW (ref 8.9–10.3)
Chloride: 102 mmol/L (ref 101–111)
Creatinine, Ser: 1.33 mg/dL — ABNORMAL HIGH (ref 0.44–1.00)
GFR calc Af Amer: 38 mL/min — ABNORMAL LOW (ref >60)
GFR, EST NON AFRICAN AMERICAN: 33 mL/min — AB (ref >60)
Glucose, Bld: 99 mg/dL (ref 65–99)
POTASSIUM: 3.9 mmol/L (ref 3.5–5.1)
Phosphorus: 4 mg/dL (ref 2.5–4.6)
Sodium: 136 mmol/L (ref 135–145)

## 2014-10-07 MED ORDER — IBANDRONATE SODIUM 3 MG/3ML IV SOLN
INTRAVENOUS | Status: AC
  Filled 2014-10-07: qty 3

## 2014-10-07 MED ORDER — IBANDRONATE SODIUM 3 MG/3ML IV SOLN
3.0000 mg | Freq: Once | INTRAVENOUS | Status: AC
  Administered 2014-10-07: 3 mg via INTRAVENOUS

## 2014-10-08 NOTE — Progress Notes (Signed)
Results for Kristin Rubio, Kristin Rubio (MRN [id number]) as of 10/08/2014 08:15  Ref. Range 10/07/2014 14:30  Sodium Latest Ref Range: 135-145 mmol/L 136  Potassium Latest Ref Range: 3.5-5.1 mmol/L 3.9  Chloride Latest Ref Range: 101-111 mmol/L 102  CO2 Latest Ref Range: 22-32 mmol/L 26  BUN Latest Ref Range: 6-20 mg/dL 18  Creatinine Latest Ref Range: 0.44-1.00 mg/dL 1.33 (H)  Calcium Latest Ref Range: 8.9-10.3 mg/dL 8.6 (L)  EGFR (Non-African Amer.) Latest Ref Range: >60 mL/min 33 (L)  EGFR (African American) Latest Ref Range: >60 mL/min 38 (L)  Glucose Latest Ref Range: 65-99 mg/dL 99  Anion gap Latest Ref Range: 5-15  8  Phosphorus Latest Ref Range: 2.5-4.6 mg/dL 4.0  Albumin Latest Ref Range: 3.5-5.0 g/dL 3.7

## 2014-10-12 ENCOUNTER — Other Ambulatory Visit: Payer: Self-pay | Admitting: Cardiology

## 2014-12-03 ENCOUNTER — Encounter: Payer: Self-pay | Admitting: Cardiology

## 2014-12-03 ENCOUNTER — Ambulatory Visit (INDEPENDENT_AMBULATORY_CARE_PROVIDER_SITE_OTHER): Payer: Medicare Other | Admitting: Cardiology

## 2014-12-03 VITALS — BP 138/60 | HR 62 | Ht 60.0 in | Wt 155.0 lb

## 2014-12-03 DIAGNOSIS — R002 Palpitations: Secondary | ICD-10-CM | POA: Diagnosis not present

## 2014-12-03 DIAGNOSIS — R011 Cardiac murmur, unspecified: Secondary | ICD-10-CM | POA: Diagnosis not present

## 2014-12-03 DIAGNOSIS — I10 Essential (primary) hypertension: Secondary | ICD-10-CM | POA: Diagnosis not present

## 2014-12-03 NOTE — Patient Instructions (Addendum)
Medication Instructions:  Your physician recommends that you continue on your current medications as directed. Please refer to the Current Medication list given to you today.  Labwork: NONE  Testing/Procedures: NONE  Follow-Up: Your physician recommends that you schedule a follow-up appointment in: 3 MONTHS WITH DR. MCDOWELL   Any Other Special Instructions Will Be Listed Below (If Applicable).     If you need a refill on your cardiac medications before your next appointment, please call your pharmacy.  Thanks for choosing [facility]!!!

## 2014-12-03 NOTE — Progress Notes (Signed)
Cardiology Office Note  Date: 12/03/2014   ID: Kristin Rubio, DOB 1917/02/10, MRN [id number]  PCP: Carylon Perches, MD  Primary Cardiologist: Nona Dell, MD   Chief Complaint  Patient presents with  . Palpitations    History of Present Illness: Kristin Rubio is a 81 y.o. female last seen in August. She presents today to follow up on history of palpitations. She has had some brief feelings of irregular heart rate, but nothing prolonged. She continues to deny any significant chest pain or syncope.  We reviewed her medications. She reports compliance and no changes in regimen.  She still lives in her apartment and takes care of basic ADLs. Enjoys playing bridge and doing crossword puzzles.   Past Medical History  Diagnosis Date  . Essential hypertension, benign   . Palpitations   . Lumbar disc disease   . Osteoporosis   . GERD (gastroesophageal reflux disease)   . Anxiety   . History of DVT (deep vein thrombosis)   . Renal insufficiency   . Macular degeneration   . Uterine prolapse   . FH: osteoporosis 10/31/2010    Current Outpatient Prescriptions  Medication Sig Dispense Refill  . aspirin 325 MG EC tablet Take 325 mg by mouth daily.    . Bisacodyl (DULCOLAX PO) Take 1 tablet by mouth 3 (three) times daily.     . citalopram (CELEXA) 10 MG tablet Take 10 mg by mouth daily.    Marland Kitchen diltiazem (CARDIZEM CD) 120 MG 24 hr capsule TAKE (1) CAPSULE BY MOUTH ONCE DAILY. 30 capsule 6  . hydrochlorothiazide (MICROZIDE) 12.5 MG capsule Take 12.5 mg by mouth daily.     . Ibandronate Sodium (BONIVA IV) Inject into the vein every 3 (three) months.     . metoprolol (TOPROL-XL) 100 MG 24 hr tablet Take 100 mg by mouth daily.      . Multiple Vitamins-Minerals (ICAPS) CAPS Take 1 capsule by mouth daily.      . pantoprazole (PROTONIX) 40 MG tablet Take 40 mg by mouth daily.      . ramipril (ALTACE) 5 MG capsule TAKE ONE CAPSULE BY MOUTH ONCE DAILY. 30 capsule 3   No current  facility-administered medications for this visit.    Allergies:  Amoxicillin   Social History: The patient  reports that she quit smoking about 63 years ago. Her smoking use included Cigarettes. She started smoking about 83 years ago. She smoked 0.10 packs per day. She has never used smokeless tobacco. She reports that she does not drink alcohol or use illicit drugs.   ROS:  Please see the history of present illness. Otherwise, complete review of systems is positive for decreased hearing.  All other systems are reviewed and negative.   Physical Exam: VS:  BP 138/60 mmHg  Pulse 62  Ht 5' (1.524 m)  Wt 155 lb (70.308 kg)  BMI 30.27 kg/m2  SpO2 98%, BMI Body mass index is 30.27 kg/(m^2).  Wt Readings from Last 3 Encounters:  12/03/14 155 lb (70.308 kg)  09/02/14 156 lb (70.761 kg)  07/04/14 155 lb (70.308 kg)     Elderly woman, comfortable at rest.  HEENT: Conjunctiva and lids normal, oropharynx with moist mucosa.  Neck: Supple, no elevated JVP or loud bruits, no thyromegaly.  Lungs: Clear to auscultation, nonlabored.  Cardiac: Regular rate and rhythm, 3/6 systolic murmur or S3 gallop.  Skin: Warm and dry.  Extremities: No significant pitting edema.   ECG: Tracing from 06/02/2014 showed normal sinus rhythm.  Recent Labwork: 10/07/2014: BUN 18; Creatinine, Ser 1.33*; Potassium 3.9; Sodium 136   ASSESSMENT AND PLAN:  1. History of palpitations. Brief episodes noted in the interim, but otherwise no major change in status. Plan is to continue Cardizem CD and Toprol-XL. She continues to prefer close follow-up for discussion.  2. Essential hypertension, no changes made to current regimen. Keep follow-up with Dr. Ouida SillsFagan.  3. Cardiac murmur consistent with aortic valve disease, possibly aortic stenosis. We have discussed this and she declines further workup.  Current medicines were reviewed at length with the patient today.  Disposition: FU with me in 3  months.   Signed, Jonelle SidleSamuel G. McDowell, MD, [facility] 12/03/2014 11:23 AM    [facility] [address], [place], [place] [address] Phone: [phone]; Fax: [phone]

## 2014-12-06 ENCOUNTER — Encounter (HOSPITAL_COMMUNITY): Payer: Self-pay | Admitting: *Deleted

## 2014-12-06 ENCOUNTER — Emergency Department (HOSPITAL_COMMUNITY)
Admission: EM | Admit: 2014-12-06 | Discharge: 2014-12-06 | Disposition: A | Discharge status: Home / Self Care | Payer: Medicare Other | Attending: Emergency Medicine | Admitting: Emergency Medicine

## 2014-12-06 DIAGNOSIS — L988 Other specified disorders of the skin and subcutaneous tissue: Secondary | ICD-10-CM | POA: Diagnosis not present

## 2014-12-06 DIAGNOSIS — I10 Essential (primary) hypertension: Secondary | ICD-10-CM | POA: Diagnosis not present

## 2014-12-06 DIAGNOSIS — Z86718 Personal history of other venous thrombosis and embolism: Secondary | ICD-10-CM | POA: Diagnosis not present

## 2014-12-06 DIAGNOSIS — Z7982 Long term (current) use of aspirin: Secondary | ICD-10-CM | POA: Diagnosis not present

## 2014-12-06 DIAGNOSIS — N390 Urinary tract infection, site not specified: Secondary | ICD-10-CM

## 2014-12-06 DIAGNOSIS — Z880 Allergy status to penicillin: Secondary | ICD-10-CM | POA: Diagnosis not present

## 2014-12-06 DIAGNOSIS — R011 Cardiac murmur, unspecified: Secondary | ICD-10-CM | POA: Insufficient documentation

## 2014-12-06 DIAGNOSIS — K219 Gastro-esophageal reflux disease without esophagitis: Secondary | ICD-10-CM | POA: Diagnosis not present

## 2014-12-06 DIAGNOSIS — Z8739 Personal history of other diseases of the musculoskeletal system and connective tissue: Secondary | ICD-10-CM | POA: Insufficient documentation

## 2014-12-06 DIAGNOSIS — Z87891 Personal history of nicotine dependence: Secondary | ICD-10-CM | POA: Diagnosis not present

## 2014-12-06 DIAGNOSIS — R300 Dysuria: Secondary | ICD-10-CM | POA: Diagnosis present

## 2014-12-06 DIAGNOSIS — Z79899 Other long term (current) drug therapy: Secondary | ICD-10-CM | POA: Diagnosis not present

## 2014-12-06 DIAGNOSIS — Z8742 Personal history of other diseases of the female genital tract: Secondary | ICD-10-CM | POA: Insufficient documentation

## 2014-12-06 DIAGNOSIS — F419 Anxiety disorder, unspecified: Secondary | ICD-10-CM | POA: Insufficient documentation

## 2014-12-06 LAB — URINALYSIS, ROUTINE W REFLEX MICROSCOPIC
Bilirubin Urine: NEGATIVE
Glucose, UA: NEGATIVE mg/dL
Ketones, ur: NEGATIVE mg/dL
Nitrite: NEGATIVE
PH: 6.5 (ref 5.0–8.0)
Protein, ur: 30 mg/dL — AB
Specific Gravity, Urine: 1.01 (ref 1.005–1.030)

## 2014-12-06 LAB — URINE MICROSCOPIC-ADD ON

## 2014-12-06 MED ORDER — CEPHALEXIN 500 MG PO CAPS: 500.0000 mg | ORAL_CAPSULE | Freq: Four times a day (QID) | ORAL | Status: DC

## 2014-12-06 MED ORDER — CEFTRIAXONE SODIUM 1 G IJ SOLR
1.0000 g | Freq: Once | INTRAMUSCULAR | Status: AC
  Administered 2014-12-06: 1 g via INTRAMUSCULAR
  Filled 2014-12-06: qty 10

## 2014-12-06 MED ORDER — LIDOCAINE HCL (PF) 1 % IJ SOLN
INTRAMUSCULAR | Status: AC
  Administered 2014-12-06: 5 mL
  Filled 2014-12-06: qty 5

## 2014-12-06 NOTE — ED Provider Notes (Signed)
CSN: [id number]     Arrival date & time 12/06/14  0606 History   First MD Initiated Contact with Patient 12/06/14 [phone]     Chief Complaint  Patient presents with  . Urinary Retention     (Consider location/radiation/quality/duration/timing/severity/associated sxs/prior Treatment) HPI  This is a 81 year old female with history of hypertension and renal insufficiency who presents with dysuria and frequency. Patient reports symptom onset on Wednesday. She has had progressive frequency. She states that she did not sleep last night because she was going to the bathroom for 5 times an hour. She also feels like she's not fully emptying her bladder. Denies any back pains or fever. She feels that she may have a bladder infection. Denies any abdominal pain, chest pain, shortness breath.  Patient lives independently.  Past Medical History  Diagnosis Date  . Essential hypertension, benign   . Palpitations   . Lumbar disc disease   . Osteoporosis   . GERD (gastroesophageal reflux disease)   . Anxiety   . History of DVT (deep vein thrombosis)   . Renal insufficiency   . Macular degeneration   . Uterine prolapse   . FH: osteoporosis 10/31/2010   Past Surgical History  Procedure Laterality Date  . Cholecystectomy    . Knee arthroscopy      Right  . Cervical polypectomy      Excision of interest cervical/ endometrial lesion  . Finger ganglion cyst excision      Mucoid cyst, left middle finger  . Vitrectomy      Partial, left eye   Family History  Problem Relation Age of Onset  . Cancer Paternal Aunt   . Stroke Paternal Uncle    Social History  Substance Use Topics  . Smoking status: Former Smoker -- 0.10 packs/day    Types: Cigarettes    Start date: 02/03/1931    Quit date: 02/03/1951  . Smokeless tobacco: Never Used  . Alcohol Use: No   OB History    No data available     Review of Systems  Constitutional: Negative for fever.  Respiratory: Negative for chest tightness  and shortness of breath.   Cardiovascular: Negative for chest pain.  Gastrointestinal: Negative for nausea, vomiting and abdominal pain.  Genitourinary: Positive for dysuria and frequency. Negative for flank pain.  Musculoskeletal: Negative for back pain.  Psychiatric/Behavioral: Negative for confusion.  All other systems reviewed and are negative.     Allergies  Amoxicillin  Home Medications   Prior to Admission medications   Medication Sig Start Date End Date Taking? Authorizing Provider  aspirin 325 MG EC tablet Take 325 mg by mouth daily.    Historical Provider, MD  Bisacodyl (DULCOLAX PO) Take 1 tablet by mouth 3 (three) times daily.     Historical Provider, MD  citalopram (CELEXA) 10 MG tablet Take 10 mg by mouth daily.    Historical Provider, MD  diltiazem (CARDIZEM CD) 120 MG 24 hr capsule TAKE (1) CAPSULE BY MOUTH ONCE DAILY. 10/12/14   Jonelle Sidle, MD  hydrochlorothiazide (MICROZIDE) 12.5 MG capsule Take 12.5 mg by mouth daily.     Historical Provider, MD  Ibandronate Sodium (BONIVA IV) Inject into the vein every 3 (three) months.     Historical Provider, MD  metoprolol (TOPROL-XL) 100 MG 24 hr tablet Take 100 mg by mouth daily.      Historical Provider, MD  Multiple Vitamins-Minerals (ICAPS) CAPS Take 1 capsule by mouth daily.      Historical Provider, MD  pantoprazole (PROTONIX) 40 MG tablet Take 40 mg by mouth daily.      Historical Provider, MD  ramipril (ALTACE) 5 MG capsule TAKE ONE CAPSULE BY MOUTH ONCE DAILY. 10/06/14   Jonelle SidleSamuel G McDowell, MD   BP 167/63 mmHg  Pulse 72  Temp(Src) 98.6 F (37 C) (Oral)  Resp 20  Ht 5' (1.524 m)  Wt 155 lb (70.308 kg)  BMI 30.27 kg/m2  SpO2 98% Physical Exam  Constitutional: She is oriented to person, place, and time. She appears well-developed and well-nourished. No distress.  Elderly  HENT:  Head: Normocephalic and atraumatic.  Cardiovascular: Normal rate and regular rhythm.   Murmur heard. Systolic ejection murmur   Pulmonary/Chest: Effort normal and breath sounds normal. No respiratory distress. She has no wheezes.  Abdominal: Soft. Bowel sounds are normal. There is no tenderness. There is no rebound.  Musculoskeletal: She exhibits no edema.  Neurological: She is alert and oriented to person, place, and time.  Skin: Skin is warm and dry.  Multiple skin lesions noted over the face and bilateral forearms  Psychiatric: She has a normal mood and affect.  Nursing note and vitals reviewed.   ED Course  Procedures (including critical care time) Labs Review Labs Reviewed  URINE CULTURE  URINALYSIS, ROUTINE W REFLEX MICROSCOPIC (NOT AT [facility])    Imaging Review No results found. I have personally reviewed and evaluated these images and lab results as part of my medical decision-making.   EKG Interpretation None      MDM   Final diagnoses:  None    Patient presents with urinary frequency and urgency. Denies back pain or fevers. Appears younger than stated age.  She lives independently. Vital signs are reassuring. Afebrile. Urinalysis obtained. At this time will defer further lab testing given patient is well-appearing and has no other complaints.  Patient signed out pending urinalysis. Suspect simple UTI.  SIgned out to Dr. Juleen ChinaKohut.  Shon Batonourtney F Horton, MD 12/06/14 [phone]

## 2014-12-06 NOTE — ED Notes (Signed)
Pt c/o increase in urination, states that she is "going every 15 minutes" and does not feel that she is emptying her bladder

## 2014-12-06 NOTE — Discharge Instructions (Signed)

## 2014-12-08 LAB — URINE CULTURE: Culture: >=100000

## 2014-12-09 ENCOUNTER — Telehealth (HOSPITAL_COMMUNITY): Payer: Self-pay

## 2014-12-09 NOTE — Telephone Encounter (Signed)
Post ED Visit - Positive Culture Follow-up: Successful Patient Follow-Up  Culture assessed and recommendations reviewed by: []  Enzo BiNathan Batchelder, Pharm.D. []  Celedonio MiyamotoJeremy Frens, Pharm.D., BCPS []  Garvin FilaMike Maccia, Pharm.D. []  Georgina PillionElizabeth Martin, Pharm.D., BCPS []  [place] River FallsMinh Pham, [address]Pharm.D., BCPS, AAHIVP []  Estella HuskMichelle Turner, Pharm.D., BCPS, AAHIVP []  Tennis Mustassie Stewart, Pharm.D. [x]  Sherle Poeob Vincent, [address]Pharm[address]D.  Positive urine culture, >/= 100,000 colonies -> Aerococcus urinae  []  Patient discharged without antimicrobial prescription and treatment is now indicated [x]  Organism is resistant to prescribed ED discharge antimicrobial []  Patient with positive blood cultures  Changes discussed with ED provider: Trixie DredgeEmily West PA Call pt.  If symptoms resolved, no further treatment.  If still symptomatic, Cipro 500 mg daily x 3 days. New antibiotic prescription Cipro 500 mg daily x 3 days [facility] [phone]  Contacted patient, date 12/09/2014, time 16:16 Pt still having problems w/dysuria.   Kristin RightClark, Kristin Rubio 12/09/2014, 4:16 PM

## 2014-12-09 NOTE — Progress Notes (Signed)
ED Antimicrobial Stewardship Positive Culture Follow Up   Kristin NordmannSara O Rubio is an 81 y.o. female who [facility] with a chief complaint of  Chief Complaint  Patient presents with  . Urinary Retention    Recent Results (from the past 720 hour(s))  Urine culture     Status: None   Collection Time: 12/06/14  6:30 AM  Result Value Ref Range Status   Specimen Description URINE, CLEAN CATCH  Final   Special Requests NONE  Final   Culture   Final    >=100,000 COLONIES/mL AEROCOCCUS URINAE Standardized susceptibility testing for this organism is not available. [facility]    Report Status 12/08/2014 FINAL  Final    Pt present with increased urinary frequency and dysuria. Afebrile and hypertensive at presentation. Lives on her own and is able to complete ADL without assistance. UA shows few bacteria, large leukocytes, and negative nitrites. Urine culture grows aerococcus urinae; no susceptibilities given. Treated outpatient with cephalexin. Discussed with Trixie DredgeEmily West, PA-C and agrees to follow up with patient regarding urinary symptoms. If improving, no further treatment necessary. If still symptomatic, will prescribe ciprofloxacin 500mg  daily x3 days. Estimated CrCl 15-20 mL/min, QTc 05/2014: 411, no interacting medications seen on profile.   ED Provider: Trixie DredgeEmily West, PA-C   Reuel Derbyobert J Vincent, PharmD. 12/09/2014, 11:21 AM PGY1 Resident Phone# [phone]

## 2015-01-06 ENCOUNTER — Encounter (HOSPITAL_COMMUNITY)
Admission: RE | Admit: 2015-01-06 | Discharge: 2015-01-06 | Disposition: A | Discharge status: Home / Self Care | Payer: Medicare Other | Source: Ambulatory Visit | Attending: Internal Medicine | Admitting: Internal Medicine

## 2015-01-06 ENCOUNTER — Encounter (HOSPITAL_COMMUNITY): Payer: Medicare Other

## 2015-01-13 ENCOUNTER — Encounter (HOSPITAL_COMMUNITY)
Admission: RE | Admit: 2015-01-13 | Discharge: 2015-01-13 | Disposition: A | Discharge status: Home / Self Care | Payer: Medicare Other | Source: Ambulatory Visit | Attending: Internal Medicine | Admitting: Internal Medicine

## 2015-01-13 ENCOUNTER — Encounter (HOSPITAL_COMMUNITY): Payer: Self-pay

## 2015-01-13 DIAGNOSIS — M810 Age-related osteoporosis without current pathological fracture: Secondary | ICD-10-CM | POA: Insufficient documentation

## 2015-01-13 LAB — RENAL FUNCTION PANEL
ALBUMIN: 3.7 g/dL (ref 3.5–5.0)
Anion gap: 6 (ref 5–15)
BUN: 20 mg/dL (ref 6–20)
CALCIUM: 9.1 mg/dL (ref 8.9–10.3)
CO2: 28 mmol/L (ref 22–32)
CREATININE: 1.52 mg/dL — AB (ref 0.44–1.00)
Chloride: 105 mmol/L (ref 101–111)
GFR calc Af Amer: 32 mL/min — ABNORMAL LOW (ref >60)
GFR, EST NON AFRICAN AMERICAN: 28 mL/min — AB (ref >60)
GLUCOSE: 84 mg/dL (ref 65–99)
PHOSPHORUS: 3.5 mg/dL (ref 2.5–4.6)
POTASSIUM: 3.9 mmol/L (ref 3.5–5.1)
SODIUM: 139 mmol/L (ref 135–145)

## 2015-01-13 MED ORDER — IBANDRONATE SODIUM 3 MG/3ML IV SOLN
3.0000 mg | Freq: Once | INTRAVENOUS | Status: AC
  Administered 2015-01-13: 3 mg via INTRAVENOUS

## 2015-01-13 MED ORDER — IBANDRONATE SODIUM 3 MG/3ML IV SOLN
INTRAVENOUS | Status: AC
  Filled 2015-01-13: qty 3

## 2015-01-13 NOTE — Progress Notes (Signed)
Results for Kristin Rubio, Kristin Rubio (MRN [id number]) as of 01/13/2015 15:21  Boniva '3mg'$  IV given today without any problems, next appointment 04/13/2015  Ref. Range 01/13/2015 14:11  Sodium Latest Ref Range: 135-145 mmol/L 139  Potassium Latest Ref Range: 3.5-5.1 mmol/L 3.9  Chloride Latest Ref Range: 101-111 mmol/L 105  CO2 Latest Ref Range: 22-32 mmol/L 28  BUN Latest Ref Range: 6-20 mg/dL 20  Creatinine Latest Ref Range: 0.44-1.00 mg/dL 1.52 (H)  Calcium Latest Ref Range: 8.9-10.3 mg/dL 9.1  EGFR (Non-African Amer.) Latest Ref Range: >60 mL/min 28 (L)  EGFR (African American) Latest Ref Range: >60 mL/min 32 (L)  Glucose Latest Ref Range: 65-99 mg/dL 84  Anion gap Latest Ref Range: 5-15  6  Phosphorus Latest Ref Range: 2.5-4.6 mg/dL PENDING  Albumin Latest Ref Range: 3.5-5.0 g/dL 3.7

## 2015-02-02 ENCOUNTER — Other Ambulatory Visit: Payer: Self-pay | Admitting: Cardiology

## 2015-03-10 ENCOUNTER — Ambulatory Visit (INDEPENDENT_AMBULATORY_CARE_PROVIDER_SITE_OTHER): Payer: Medicare Other | Admitting: Cardiology

## 2015-03-10 ENCOUNTER — Encounter: Payer: Self-pay | Admitting: Cardiology

## 2015-03-10 VITALS — BP 122/52 | HR 67 | Ht 60.0 in | Wt 156.0 lb

## 2015-03-10 DIAGNOSIS — I10 Essential (primary) hypertension: Secondary | ICD-10-CM | POA: Diagnosis not present

## 2015-03-10 DIAGNOSIS — R002 Palpitations: Secondary | ICD-10-CM | POA: Diagnosis not present

## 2015-03-10 DIAGNOSIS — R011 Cardiac murmur, unspecified: Secondary | ICD-10-CM

## 2015-03-10 NOTE — Progress Notes (Signed)
Cardiology Office Note  Date: 03/10/2015   ID: Kristin Rubio, DOB [birth year], MRN [id number]  PCP: Carylon Perches, MD  Primary Cardiologist: Nona Dell, MD   Chief Complaint  Patient presents with  . Palpitations    History of Present Illness: Kristin Rubio is a 82 y.o. female last seen in November 2016. She presents for a routine follow-up visit. She states that she sometimes feels a more forceful heartbeat in the morning just before she gets up, but no major progression. She has had no sudden dizziness or syncope. No exertional chest pain.  We discussed her medications. She continues on aspirin, Cardizem CD, and Toprol-XL.  Overall reports no change in functional capacity.  Past Medical History  Diagnosis Date  . Essential hypertension, benign   . Palpitations   . Lumbar disc disease   . Osteoporosis   . GERD (gastroesophageal reflux disease)   . Anxiety   . History of DVT (deep vein thrombosis)   . Renal insufficiency   . Macular degeneration   . Uterine prolapse   . FH: osteoporosis 10/31/2010    Current Outpatient Prescriptions  Medication Sig Dispense Refill  . aspirin 325 MG EC tablet Take 325 mg by mouth daily.    . citalopram (CELEXA) 10 MG tablet Take 10 mg by mouth daily.    Marland Kitchen diltiazem (CARDIZEM CD) 120 MG 24 hr capsule TAKE (1) CAPSULE BY MOUTH ONCE DAILY. 30 capsule 6  . hydrochlorothiazide (MICROZIDE) 12.5 MG capsule Take 12.5 mg by mouth daily.     . Ibandronate Sodium (BONIVA IV) Inject into the vein every 3 (three) months.     . metoprolol (TOPROL-XL) 100 MG 24 hr tablet Take 100 mg by mouth daily.      . Multiple Vitamins-Minerals (ICAPS) CAPS Take 1 capsule by mouth daily.      . pantoprazole (PROTONIX) 40 MG tablet Take 40 mg by mouth daily.      . ramipril (ALTACE) 5 MG capsule TAKE ONE CAPSULE BY MOUTH ONCE DAILY. 30 capsule 3   No current facility-administered medications for this visit.   Allergies:  Amoxicillin   Social History: The  patient  reports that she quit smoking about 64 years ago. Her smoking use included Cigarettes. She started smoking about 84 years ago. She smoked 0.10 packs per day. She has never used smokeless tobacco. She reports that she does not drink alcohol or use illicit drugs.   ROS:  Please see the history of present illness. Otherwise, complete review of systems is positive for decreased hearing, arthritis pains.  All other systems are reviewed and negative.   Physical Exam: VS:  BP 122/52 mmHg  Pulse 67  Ht 5' (1.524 m)  Wt 156 lb (70.761 kg)  BMI 30.47 kg/m2  SpO2 97%, BMI Body mass index is 30.47 kg/(m^2).  Wt Readings from Last 3 Encounters:  03/10/15 156 lb (70.761 kg)  01/13/15 156 lb (70.761 kg)  12/06/14 155 lb (70.308 kg)    Elderly woman, comfortable at rest. Using a walker. HEENT: Conjunctiva and lids normal, oropharynx with moist mucosa.  Neck: Supple, no elevated JVP or loud bruits, no thyromegaly.  Lungs: Clear to auscultation, nonlabored.  Cardiac: Regular rate and rhythm, 3/6 systolic murmur or S3 gallop.  Skin: Warm and dry.  Extremities: No significant pitting edema.  ECG: I personally reviewed the prior tracing from 5/70/2016 which showed normal sinus rhythm.  Recent Labwork: 01/13/2015: BUN 20; Creatinine, Ser 1.52*; Potassium 3.9; Sodium 139  Assessment and Plan:  1. Intermittent palpitations, frequency and intensity have been stable on current regimen. She prefers close follow-up  2. Cardiac murmur consistent with aortic valve stenosis. She declines further testing.  3. Essential hypertension with adequate control. No changes made today.  Current medicines were reviewed with the patient today.  Disposition: FU with me in 3 months.   Signed, Jonelle Sidle, MD, [facility] 03/10/2015 11:22 AM    [facility] at [facility] [address] [place], [place] [address] Phone: [phone]; Fax: [phone]

## 2015-03-10 NOTE — Patient Instructions (Signed)
Medication Instructions:  Your physician recommends that you continue on your current medications as directed. Please refer to the Current Medication list given to you today.   Labwork: NONE  Testing/Procedures: NONE  Follow-Up: Your physician recommends that you schedule a follow-up appointment in: 3 MONTHS    Any Other Special Instructions Will Be Listed Below (If Applicable).     If you need a refill on your cardiac medications before your next appointment, please call your pharmacy.   

## 2015-04-13 ENCOUNTER — Encounter (HOSPITAL_COMMUNITY): Payer: Self-pay

## 2015-04-13 ENCOUNTER — Encounter (HOSPITAL_COMMUNITY)
Admission: RE | Admit: 2015-04-13 | Discharge: 2015-04-13 | Disposition: A | Discharge status: Home / Self Care | Payer: Medicare Other | Source: Ambulatory Visit | Attending: Internal Medicine | Admitting: Internal Medicine

## 2015-04-13 DIAGNOSIS — M810 Age-related osteoporosis without current pathological fracture: Secondary | ICD-10-CM | POA: Insufficient documentation

## 2015-04-13 LAB — RENAL FUNCTION PANEL
ALBUMIN: 3.8 g/dL (ref 3.5–5.0)
ANION GAP: 8 (ref 5–15)
BUN: 18 mg/dL (ref 6–20)
CHLORIDE: 105 mmol/L (ref 101–111)
CO2: 26 mmol/L (ref 22–32)
Calcium: 8.8 mg/dL — ABNORMAL LOW (ref 8.9–10.3)
Creatinine, Ser: 1.42 mg/dL — ABNORMAL HIGH (ref 0.44–1.00)
GFR, EST AFRICAN AMERICAN: 35 mL/min — AB (ref >60)
GFR, EST NON AFRICAN AMERICAN: 30 mL/min — AB (ref >60)
Glucose, Bld: 73 mg/dL (ref 65–99)
PHOSPHORUS: 4.1 mg/dL (ref 2.5–4.6)
POTASSIUM: 4.2 mmol/L (ref 3.5–5.1)
Sodium: 139 mmol/L (ref 135–145)

## 2015-04-13 MED ORDER — IBANDRONATE SODIUM 3 MG/3ML IV SOLN
INTRAVENOUS | Status: AC
  Filled 2015-04-13: qty 3

## 2015-04-13 MED ORDER — IBANDRONATE SODIUM 3 MG/3ML IV SOLN
3.0000 mg | Freq: Once | INTRAVENOUS | Status: AC
  Administered 2015-04-13: 3 mg via INTRAVENOUS

## 2015-04-15 NOTE — Progress Notes (Signed)
Results for Kristin Rubio, Kristin Rubio (MRN [id number]) as of 04/15/2015 07:46   Ref. Range 04/13/2015 14:14  Sodium Latest Ref Range: 135-145 mmol/L 139  Potassium Latest Ref Range: 3.5-5.1 mmol/L 4.2  Chloride Latest Ref Range: 101-111 mmol/L 105  CO2 Latest Ref Range: 22-32 mmol/L 26  BUN Latest Ref Range: 6-20 mg/dL 18  Creatinine Latest Ref Range: 0.44-1.00 mg/dL 1.42 (H)  Calcium Latest Ref Range: 8.9-10.3 mg/dL 8.8 (L)  EGFR (Non-African Amer.) Latest Ref Range: >60 mL/min 30 (L)  EGFR (African American) Latest Ref Range: >60 mL/min 35 (L)  Glucose Latest Ref Range: 65-99 mg/dL 73  Anion gap Latest Ref Range: 5-15  8  Phosphorus Latest Ref Range: 2.5-4.6 mg/dL 4.1  Albumin Latest Ref Range: 3.5-5.0 g/dL 3.8

## 2015-05-14 ENCOUNTER — Other Ambulatory Visit: Payer: Self-pay | Admitting: Cardiology

## 2015-06-16 ENCOUNTER — Ambulatory Visit (INDEPENDENT_AMBULATORY_CARE_PROVIDER_SITE_OTHER): Payer: Medicare Other | Admitting: Cardiology

## 2015-06-16 ENCOUNTER — Encounter: Payer: Self-pay | Admitting: Cardiology

## 2015-06-16 VITALS — BP 144/66 | HR 61 | Ht 61.0 in | Wt 152.0 lb

## 2015-06-16 DIAGNOSIS — R011 Cardiac murmur, unspecified: Secondary | ICD-10-CM | POA: Diagnosis not present

## 2015-06-16 DIAGNOSIS — I10 Essential (primary) hypertension: Secondary | ICD-10-CM

## 2015-06-16 DIAGNOSIS — R002 Palpitations: Secondary | ICD-10-CM

## 2015-06-16 NOTE — Progress Notes (Signed)
Cardiology Office Note  Date: 06/16/2015   ID: Kristin NordmannSara O Westervelt, DOB 12/22/1917, MRN [id number]  PCP: Carylon PerchesFAGAN,ROY, MD  Primary Cardiologist: Nona DellSamuel Emil Weigold, MD   Chief Complaint  Patient presents with  . Follow-up palpitations    History of Present Illness: Kristin Rubio is a 82 y.o. female last seen in February. She presents for a routine follow-up visit. Reports no significant change since last visit, specifically no problems with palpitations on her current regimen, no dizziness or syncope.  She states that she did have a fall in her kitchen since I saw her, no injury however. She uses a rolling walker.   I reviewed her medications which are stable from a cardiac perspective. She continues to see Dr. Ouida SillsFagan on a regular basis for primary care.  Past Medical History  Diagnosis Date  . Essential hypertension, benign   . Palpitations   . Lumbar disc disease   . Osteoporosis   . GERD (gastroesophageal reflux disease)   . Anxiety   . History of DVT (deep vein thrombosis)   . Renal insufficiency   . Macular degeneration   . Uterine prolapse   . FH: osteoporosis 10/31/2010    Current Outpatient Prescriptions  Medication Sig Dispense Refill  . aspirin 325 MG EC tablet Take 325 mg by mouth daily.    . citalopram (CELEXA) 10 MG tablet Take 10 mg by mouth daily.    Marland Kitchen. diltiazem (CARDIZEM CD) 120 MG 24 hr capsule TAKE (1) CAPSULE BY MOUTH ONCE DAILY. 30 capsule 6  . hydrochlorothiazide (MICROZIDE) 12.5 MG capsule Take 12.5 mg by mouth daily.     . Ibandronate Sodium (BONIVA IV) Inject into the vein every 3 (three) months.     . metoprolol (TOPROL-XL) 100 MG 24 hr tablet Take 100 mg by mouth daily.      . Multiple Vitamins-Minerals (ICAPS) CAPS Take 1 capsule by mouth daily.      . pantoprazole (PROTONIX) 40 MG tablet Take 40 mg by mouth daily.      . ramipril (ALTACE) 5 MG capsule TAKE ONE CAPSULE BY MOUTH ONCE DAILY. 30 capsule 3   No current facility-administered medications for  this visit.   Allergies:  Amoxicillin   Social History: The patient  reports that she quit smoking about 64 years ago. Her smoking use included Cigarettes. She started smoking about 84 years ago. She smoked 0.10 packs per day. She has never used smokeless tobacco. She reports that she does not drink alcohol or use illicit drugs.   ROS:  Please see the history of present illness. Otherwise, complete review of systems is positive for decreased hearing, stable arthritic symptoms.  All other systems are reviewed and negative.  Physical Exam: VS:  BP 144/66 mmHg  Pulse 61  Ht 5\' 1"  (1.549 m)  Wt 152 lb (68.947 kg)  BMI 28.74 kg/m2  SpO2 93%, BMI Body mass index is 28.74 kg/(m^2).  Wt Readings from Last 3 Encounters:  06/16/15 152 lb (68.947 kg)  03/10/15 156 lb (70.761 kg)  01/13/15 156 lb (70.761 kg)    Elderly woman, comfortable at rest. Using a walker. HEENT: Conjunctiva and lids normal, oropharynx with moist mucosa.  Neck: Supple, no elevated JVP or loud bruits, no thyromegaly.  Lungs: Clear to auscultation, nonlabored.  Cardiac: Regular rate and rhythm, 3/6 systolic murmur or S3 gallop.  Skin: Warm and dry.  Extremities: No significant pitting edema.  ECG: I personally reviewed the prior tracing from 06/02/2014 which showed normal  sinus rhythm.  Recent Labwork: 04/13/2015: BUN 18; Creatinine, Ser 1.42*; Potassium 4.2; Sodium 139   Assessment and Plan:  1. Palpitations, symptomatically stable on current regimen including Toprol-XL and Cardizem CD. She reports occasional breakthrough, no sustained events or hospitalizations. She continues to prefer regular follow-up.  2. Essential hypertension, no changes made present regimen with systolic blood pressure in the 140s. Keep follow-up with Dr. Ouida Sills.  3. Cardiac murmur consistent with aortic stenosis. She declines further evaluation at this time.  Current medicines were reviewed with the patient today.  Disposition: FU with  me in 3 months.   Signed, Jonelle Sidle, MD, [facility] 06/16/2015 11:39 AM    [facility] at [facility] [address], [place], [place] [address] Phone: [phone]; Fax: [phone]

## 2015-06-16 NOTE — Patient Instructions (Signed)
Your physician recommends that you schedule a follow-up appointment in:  3 months with Dr McDowell     Your physician recommends that you continue on your current medications as directed. Please refer to the Current Medication list given to you today.      Thank you for choosing [facility] !         

## 2015-07-06 ENCOUNTER — Other Ambulatory Visit: Payer: Self-pay | Admitting: Cardiology

## 2015-07-14 ENCOUNTER — Encounter (HOSPITAL_COMMUNITY)
Admission: RE | Admit: 2015-07-14 | Discharge: 2015-07-14 | Disposition: A | Discharge status: Home / Self Care | Payer: Medicare Other | Source: Ambulatory Visit | Attending: Internal Medicine | Admitting: Internal Medicine

## 2015-07-14 DIAGNOSIS — M810 Age-related osteoporosis without current pathological fracture: Secondary | ICD-10-CM | POA: Diagnosis not present

## 2015-07-14 LAB — RENAL FUNCTION PANEL
Albumin: 3.4 g/dL — ABNORMAL LOW (ref 3.5–5.0)
Anion gap: 7 (ref 5–15)
BUN: 23 mg/dL — ABNORMAL HIGH (ref 6–20)
CHLORIDE: 103 mmol/L (ref 101–111)
CO2: 26 mmol/L (ref 22–32)
CREATININE: 1.31 mg/dL — AB (ref 0.44–1.00)
Calcium: 8.8 mg/dL — ABNORMAL LOW (ref 8.9–10.3)
GFR, EST AFRICAN AMERICAN: 38 mL/min — AB (ref >60)
GFR, EST NON AFRICAN AMERICAN: 33 mL/min — AB (ref >60)
Glucose, Bld: 97 mg/dL (ref 65–99)
POTASSIUM: 4.1 mmol/L (ref 3.5–5.1)
Phosphorus: 3.9 mg/dL (ref 2.5–4.6)
Sodium: 136 mmol/L (ref 135–145)

## 2015-07-14 MED ORDER — IBANDRONATE SODIUM 3 MG/3ML IV SOLN
INTRAVENOUS | Status: AC
  Filled 2015-07-14: qty 3

## 2015-07-14 MED ORDER — IBANDRONATE SODIUM 3 MG/3ML IV SOLN
3.0000 mg | Freq: Once | INTRAVENOUS | Status: AC
  Administered 2015-07-14: 3 mg via INTRAVENOUS

## 2015-07-15 NOTE — Progress Notes (Signed)
Results for SHERISSE, FULLILOVE (MRN [id number]) as of 07/15/2015 10:58  Ref. Range 07/14/2015 13:10  Sodium Latest Ref Range: 135-145 mmol/L 136  Potassium Latest Ref Range: 3.5-5.1 mmol/L 4.1  Chloride Latest Ref Range: 101-111 mmol/L 103  CO2 Latest Ref Range: 22-32 mmol/L 26  BUN Latest Ref Range: 6-20 mg/dL 23 (H)  Creatinine Latest Ref Range: 0.44-1.00 mg/dL 1.31 (H)  Calcium Latest Ref Range: 8.9-10.3 mg/dL 8.8 (L)  EGFR (Non-African Amer.) Latest Ref Range: >60 mL/min 33 (L)  EGFR (African American) Latest Ref Range: >60 mL/min 38 (L)  Glucose Latest Ref Range: 65-99 mg/dL 97  Anion gap Latest Ref Range: 5-15  7  Phosphorus Latest Ref Range: 2.5-4.6 mg/dL 3.9  Albumin Latest Ref Range: 3.5-5.0 g/dL 3.4 (L)

## 2015-08-02 ENCOUNTER — Other Ambulatory Visit: Payer: Self-pay | Admitting: Internal Medicine

## 2015-08-02 ENCOUNTER — Observation Stay (HOSPITAL_COMMUNITY): Payer: Medicare Other

## 2015-08-02 ENCOUNTER — Observation Stay (HOSPITAL_COMMUNITY)
Admission: AD | Admit: 2015-08-02 | Discharge: 2015-08-05 | Disposition: A | Discharge status: Home / Self Care | Payer: Medicare Other | Source: Ambulatory Visit | Attending: Internal Medicine | Admitting: Internal Medicine

## 2015-08-02 ENCOUNTER — Encounter (HOSPITAL_COMMUNITY): Payer: Self-pay | Admitting: Oncology

## 2015-08-02 DIAGNOSIS — I129 Hypertensive chronic kidney disease with stage 1 through stage 4 chronic kidney disease, or unspecified chronic kidney disease: Secondary | ICD-10-CM | POA: Insufficient documentation

## 2015-08-02 DIAGNOSIS — I700 Atherosclerosis of aorta: Secondary | ICD-10-CM | POA: Diagnosis not present

## 2015-08-02 DIAGNOSIS — Z79899 Other long term (current) drug therapy: Secondary | ICD-10-CM | POA: Diagnosis not present

## 2015-08-02 DIAGNOSIS — Z87891 Personal history of nicotine dependence: Secondary | ICD-10-CM | POA: Diagnosis not present

## 2015-08-02 DIAGNOSIS — I447 Left bundle-branch block, unspecified: Secondary | ICD-10-CM | POA: Diagnosis not present

## 2015-08-02 DIAGNOSIS — M5136 Other intervertebral disc degeneration, lumbar region: Secondary | ICD-10-CM | POA: Insufficient documentation

## 2015-08-02 DIAGNOSIS — Z880 Allergy status to penicillin: Secondary | ICD-10-CM | POA: Insufficient documentation

## 2015-08-02 DIAGNOSIS — Z7982 Long term (current) use of aspirin: Secondary | ICD-10-CM | POA: Diagnosis not present

## 2015-08-02 DIAGNOSIS — K219 Gastro-esophageal reflux disease without esophagitis: Secondary | ICD-10-CM | POA: Diagnosis not present

## 2015-08-02 DIAGNOSIS — N184 Chronic kidney disease, stage 4 (severe): Secondary | ICD-10-CM | POA: Insufficient documentation

## 2015-08-02 DIAGNOSIS — I081 Rheumatic disorders of both mitral and tricuspid valves: Secondary | ICD-10-CM | POA: Insufficient documentation

## 2015-08-02 DIAGNOSIS — H3530 Unspecified macular degeneration: Secondary | ICD-10-CM | POA: Diagnosis not present

## 2015-08-02 DIAGNOSIS — I371 Nonrheumatic pulmonary valve insufficiency: Secondary | ICD-10-CM | POA: Diagnosis not present

## 2015-08-02 DIAGNOSIS — I472 Ventricular tachycardia: Secondary | ICD-10-CM | POA: Insufficient documentation

## 2015-08-02 DIAGNOSIS — I200 Unstable angina: Secondary | ICD-10-CM | POA: Diagnosis not present

## 2015-08-02 DIAGNOSIS — Z823 Family history of stroke: Secondary | ICD-10-CM | POA: Diagnosis not present

## 2015-08-02 DIAGNOSIS — I350 Nonrheumatic aortic (valve) stenosis: Principal | ICD-10-CM | POA: Insufficient documentation

## 2015-08-02 DIAGNOSIS — I491 Atrial premature depolarization: Secondary | ICD-10-CM | POA: Insufficient documentation

## 2015-08-02 DIAGNOSIS — F419 Anxiety disorder, unspecified: Secondary | ICD-10-CM | POA: Insufficient documentation

## 2015-08-02 DIAGNOSIS — Z809 Family history of malignant neoplasm, unspecified: Secondary | ICD-10-CM | POA: Insufficient documentation

## 2015-08-02 DIAGNOSIS — M810 Age-related osteoporosis without current pathological fracture: Secondary | ICD-10-CM | POA: Diagnosis not present

## 2015-08-02 DIAGNOSIS — R079 Chest pain, unspecified: Secondary | ICD-10-CM | POA: Diagnosis present

## 2015-08-02 DIAGNOSIS — Z86718 Personal history of other venous thrombosis and embolism: Secondary | ICD-10-CM | POA: Diagnosis not present

## 2015-08-02 DIAGNOSIS — Z9049 Acquired absence of other specified parts of digestive tract: Secondary | ICD-10-CM | POA: Insufficient documentation

## 2015-08-02 HISTORY — DX: Chest pain, unspecified: R07.9

## 2015-08-02 LAB — CBC
HEMATOCRIT: 35.1 % — AB (ref 36.0–46.0)
HEMOGLOBIN: 11.2 g/dL — AB (ref 12.0–15.0)
MCH: 31.6 pg (ref 26.0–34.0)
MCHC: 31.9 g/dL (ref 30.0–36.0)
MCV: 99.2 fL (ref 78.0–100.0)
Platelets: 195 10*3/uL (ref 150–400)
RBC: 3.54 MIL/uL — AB (ref 3.87–5.11)
RDW: 14 % (ref 11.5–15.5)
WBC: 6.8 10*3/uL (ref 4.0–10.5)

## 2015-08-02 LAB — COMPREHENSIVE METABOLIC PANEL
ALBUMIN: 3.4 g/dL — AB (ref 3.5–5.0)
ALK PHOS: 69 U/L (ref 38–126)
ALT: 10 U/L — AB (ref 14–54)
AST: 17 U/L (ref 15–41)
Anion gap: 6 (ref 5–15)
BILIRUBIN TOTAL: 0.5 mg/dL (ref 0.3–1.2)
BUN: 24 mg/dL — AB (ref 6–20)
CALCIUM: 8.6 mg/dL — AB (ref 8.9–10.3)
CO2: 25 mmol/L (ref 22–32)
Chloride: 104 mmol/L (ref 101–111)
Creatinine, Ser: 1.48 mg/dL — ABNORMAL HIGH (ref 0.44–1.00)
GFR calc Af Amer: 33 mL/min — ABNORMAL LOW (ref >60)
GFR calc non Af Amer: 29 mL/min — ABNORMAL LOW (ref >60)
GLUCOSE: 123 mg/dL — AB (ref 65–99)
Potassium: 4.1 mmol/L (ref 3.5–5.1)
Sodium: 135 mmol/L (ref 135–145)
TOTAL PROTEIN: 6.5 g/dL (ref 6.5–8.1)

## 2015-08-02 LAB — APTT: aPTT: 32 seconds (ref 24–37)

## 2015-08-02 LAB — TROPONIN I
Troponin I: 0.03 ng/mL (ref <0.03)
Troponin I: 0.03 ng/mL (ref <0.03)

## 2015-08-02 LAB — PROTIME-INR
INR: 1.01 (ref 0.00–1.49)
Prothrombin Time: 13.5 seconds (ref 11.6–15.2)

## 2015-08-02 LAB — MRSA PCR SCREENING: MRSA by PCR: NEGATIVE

## 2015-08-02 MED ORDER — ASPIRIN EC 81 MG PO TBEC
81.0000 mg | DELAYED_RELEASE_TABLET | Freq: Every day | ORAL | Status: DC
  Administered 2015-08-02 – 2015-08-05 (×4): 81 mg via ORAL
  Filled 2015-08-02 (×4): qty 1

## 2015-08-02 MED ORDER — ONDANSETRON HCL 4 MG PO TABS: 4.0000 mg | ORAL_TABLET | Freq: Four times a day (QID) | ORAL | Status: DC | PRN

## 2015-08-02 MED ORDER — PANTOPRAZOLE SODIUM 40 MG PO TBEC
40.0000 mg | DELAYED_RELEASE_TABLET | Freq: Every day | ORAL | Status: DC
  Administered 2015-08-03 – 2015-08-05 (×3): 40 mg via ORAL
  Filled 2015-08-02 (×3): qty 1

## 2015-08-02 MED ORDER — SODIUM CHLORIDE 0.9 % IV SOLN
INTRAVENOUS | Status: DC
  Administered 2015-08-02: 10 mL/h via INTRAVENOUS

## 2015-08-02 MED ORDER — ONDANSETRON HCL 4 MG/2ML IJ SOLN: 4.0000 mg | Freq: Four times a day (QID) | INTRAMUSCULAR | Status: DC | PRN

## 2015-08-02 MED ORDER — ENSURE ENLIVE PO LIQD
237.0000 mL | Freq: Two times a day (BID) | ORAL | Status: DC
  Administered 2015-08-03: 237 mL via ORAL

## 2015-08-02 MED ORDER — NITROGLYCERIN 0.4 MG SL SUBL
0.4000 mg | SUBLINGUAL_TABLET | SUBLINGUAL | Status: DC | PRN
  Administered 2015-08-02: 0.4 mg via SUBLINGUAL
  Filled 2015-08-02: qty 1

## 2015-08-02 MED ORDER — CITALOPRAM HYDROBROMIDE 20 MG PO TABS
10.0000 mg | ORAL_TABLET | Freq: Every day | ORAL | Status: DC
  Administered 2015-08-03 – 2015-08-05 (×3): 10 mg via ORAL
  Filled 2015-08-02 (×3): qty 1

## 2015-08-02 MED ORDER — DILTIAZEM HCL ER COATED BEADS 120 MG PO CP24
120.0000 mg | ORAL_CAPSULE | Freq: Every day | ORAL | Status: DC
  Administered 2015-08-03 – 2015-08-05 (×3): 120 mg via ORAL
  Filled 2015-08-02 (×3): qty 1

## 2015-08-02 MED ORDER — METOPROLOL SUCCINATE ER 50 MG PO TB24
100.0000 mg | ORAL_TABLET | Freq: Every day | ORAL | Status: DC
  Administered 2015-08-02 – 2015-08-04 (×3): 100 mg via ORAL
  Filled 2015-08-02 (×3): qty 2

## 2015-08-02 MED ORDER — RAMIPRIL 5 MG PO CAPS
5.0000 mg | ORAL_CAPSULE | Freq: Every day | ORAL | Status: DC
  Administered 2015-08-03: 5 mg via ORAL
  Filled 2015-08-02: qty 1

## 2015-08-02 MED ORDER — HEPARIN BOLUS VIA INFUSION
3500.0000 [IU] | Freq: Once | INTRAVENOUS | Status: AC
  Administered 2015-08-02: 3500 [IU] via INTRAVENOUS
  Filled 2015-08-02: qty 3500

## 2015-08-02 MED ORDER — HEPARIN (PORCINE) IN NACL 100-0.45 UNIT/ML-% IJ SOLN
700.0000 [IU]/h | INTRAMUSCULAR | Status: DC
  Administered 2015-08-02 – 2015-08-04 (×2): 700 [IU]/h via INTRAVENOUS
  Filled 2015-08-02 (×2): qty 250

## 2015-08-02 NOTE — Progress Notes (Signed)
EKG done, results given to RN. RT will continue to monitor.  

## 2015-08-02 NOTE — Progress Notes (Signed)
ANTICOAGULATION CONSULT NOTE - Initial Consult  Pharmacy Consult for Heparin Indication: chest pain/ACS  Allergies  Allergen Reactions  . Amoxicillin Rash    rash    Patient Measurements: Height: 5' (152.4 cm) Weight: 136 lb 0.4 oz (61.7 kg) IBW/kg (Calculated) : 45.5 HEPARIN DW (KG): 58.3  Vital Signs: Temp: 99.2 F (37.3 C) (07/17 1633) Temp Source: Oral (07/17 1633) BP: 138/81 mmHg (07/17 1633) Pulse Rate: 73 (07/17 1633)  Labs: No results for input(s): HGB, HCT, PLT, APTT, LABPROT, INR, HEPARINUNFRC, HEPRLOWMOCWT, CREATININE, CKTOTAL, CKMB, TROPONINI in the last 72 hours.  Estimated Creatinine Clearance: 20.6 mL/min (by C-G formula based on Cr of 1.31).   Medical History: Past Medical History  Diagnosis Date  . Essential hypertension, benign   . Palpitations   . Lumbar disc disease   . Osteoporosis   . GERD (gastroesophageal reflux disease)   . Anxiety   . History of DVT (deep vein thrombosis)   . Renal insufficiency   . Macular degeneration   . Uterine prolapse   . FH: osteoporosis 10/31/2010    Medications:  Prescriptions prior to admission  Medication Sig Dispense Refill Last Dose  . aspirin 325 MG EC tablet Take 325 mg by mouth daily.   07/14/2015 at Unknown time  . citalopram (CELEXA) 10 MG tablet Take 10 mg by mouth daily.   07/14/2015 at Unknown time  . diltiazem (CARDIZEM CD) 120 MG 24 hr capsule TAKE (1) CAPSULE BY MOUTH ONCE DAILY. 30 capsule 6 07/14/2015 at Unknown time  . hydrochlorothiazide (MICROZIDE) 12.5 MG capsule Take 12.5 mg by mouth daily.    07/14/2015 at Unknown time  . Ibandronate Sodium (BONIVA IV) Inject into the vein every 3 (three) months.    07/14/2015 at Unknown time  . metoprolol (TOPROL-XL) 100 MG 24 hr tablet Take 100 mg by mouth daily.     07/13/2015 at Unknown time  . Multiple Vitamins-Minerals (ICAPS) CAPS Take 1 capsule by mouth daily.     07/14/2015 at Unknown time  . pantoprazole (PROTONIX) 40 MG tablet Take 40 mg by mouth  daily.     07/14/2015 at Unknown time  . ramipril (ALTACE) 5 MG capsule TAKE ONE CAPSULE BY MOUTH ONCE DAILY. 30 capsule 3 07/14/2015 at Unknown time    Assessment: 82 yo female admitted directly from MD's office with chest pain. Start heparin for ACS Goal of Therapy:  Heparin level 0.3-0.7 units/ml Monitor platelets by anticoagulation protocol: Yes   Plan:  Give 3500 units bolus x 1 Start heparin infusion at 700 units/hr Check anti-Xa level in 8 hours and daily while on heparin Continue to monitor H&H and platelets  Kristin Rubio, BS Loura BackPharm D, BCPS Clinical Pharmacist Pager [phone] 08/02/2015,4:57 PM

## 2015-08-02 NOTE — Progress Notes (Signed)
Patient direct admission. Arrived to unit via wheelchair. Caregiver present at bedside. Patient states she has had intermittent chest pain today denies any c/o chest pain at this time.

## 2015-08-03 ENCOUNTER — Observation Stay (HOSPITAL_BASED_OUTPATIENT_CLINIC_OR_DEPARTMENT_OTHER): Payer: Medicare Other

## 2015-08-03 ENCOUNTER — Encounter (HOSPITAL_COMMUNITY): Payer: Self-pay | Admitting: Cardiology

## 2015-08-03 DIAGNOSIS — R002 Palpitations: Secondary | ICD-10-CM | POA: Diagnosis not present

## 2015-08-03 DIAGNOSIS — N183 Chronic kidney disease, stage 3 (moderate): Secondary | ICD-10-CM | POA: Diagnosis not present

## 2015-08-03 DIAGNOSIS — I350 Nonrheumatic aortic (valve) stenosis: Secondary | ICD-10-CM | POA: Diagnosis not present

## 2015-08-03 DIAGNOSIS — I10 Essential (primary) hypertension: Secondary | ICD-10-CM | POA: Diagnosis not present

## 2015-08-03 DIAGNOSIS — R079 Chest pain, unspecified: Secondary | ICD-10-CM | POA: Diagnosis not present

## 2015-08-03 DIAGNOSIS — I129 Hypertensive chronic kidney disease with stage 1 through stage 4 chronic kidney disease, or unspecified chronic kidney disease: Secondary | ICD-10-CM | POA: Diagnosis not present

## 2015-08-03 DIAGNOSIS — N184 Chronic kidney disease, stage 4 (severe): Secondary | ICD-10-CM | POA: Diagnosis not present

## 2015-08-03 DIAGNOSIS — I200 Unstable angina: Secondary | ICD-10-CM | POA: Diagnosis not present

## 2015-08-03 LAB — CBC
HCT: 34.9 % — ABNORMAL LOW (ref 36.0–46.0)
Hemoglobin: 11.2 g/dL — ABNORMAL LOW (ref 12.0–15.0)
MCH: 31.5 pg (ref 26.0–34.0)
MCHC: 32.1 g/dL (ref 30.0–36.0)
MCV: 98.3 fL (ref 78.0–100.0)
PLATELETS: 197 10*3/uL (ref 150–400)
RBC: 3.55 MIL/uL — ABNORMAL LOW (ref 3.87–5.11)
RDW: 13.9 % (ref 11.5–15.5)
WBC: 6.2 10*3/uL (ref 4.0–10.5)

## 2015-08-03 LAB — ECHOCARDIOGRAM COMPLETE
HEIGHTINCHES: 60 in
Weight: 2328.06 oz

## 2015-08-03 LAB — TROPONIN I: TROPONIN I: 0.03 ng/mL — AB (ref <0.03)

## 2015-08-03 LAB — HEPARIN LEVEL (UNFRACTIONATED): Heparin Unfractionated: 0.31 IU/mL (ref 0.30–0.70)

## 2015-08-03 MED ORDER — ENSURE ENLIVE PO LIQD
237.0000 mL | ORAL | Status: DC
  Administered 2015-08-04 – 2015-08-05 (×2): 237 mL via ORAL

## 2015-08-03 MED ORDER — ADULT MULTIVITAMIN W/MINERALS CH
1.0000 | ORAL_TABLET | Freq: Every day | ORAL | Status: DC
  Administered 2015-08-03 – 2015-08-05 (×3): 1 via ORAL
  Filled 2015-08-03 (×3): qty 1

## 2015-08-03 NOTE — Consult Note (Signed)
Requesting provider: Dr. Carylon Perchesoy Fagan Primary cardiologist: Dr. Jonelle SidleSamuel G. Jyair Kiraly Consulting cardiologist: Dr. Jonelle SidleSamuel G. Kamel Haven  Reason for consultation: Chest pain  Clinical Summary [name] is a 82 y.o.female with past medical history outlined below, currently admitted to the hospital with chest discomfort and abnormal ECG. She states that she had a fall on Saturday, lost her balance (no syncope). Since that time she has experienced more palpitations and had associated chest discomfort. She saw Dr. Ouida SillsFagan in the office on Monday and was found to have new anterior T-wave inversions by ECG, subsequently admitted to the hospital. She feels better today, has been on heparin, troponin I levels 0.03 in flat pattern. Her ECG does show new anterior T-wave inversions compared to her last tracing in May 2016.  Mr. Kristin Rubio still lives in her own apartment. She has had relative decline over the last year with increasing falls per discussion with family.  I reviewed her medications, she reports compliance at home.  She has not had a recent echocardiogram, suspected aortic stenosis based on examination. Based on discussion in the office with her previously, she has preferred conservative approach with no further testing, particularly no invasive testing. This remains the case now.   Allergies  Allergen Reactions  . Amoxicillin Rash    rash    Medications Scheduled Medications: . aspirin EC  81 mg Oral Daily  . citalopram  10 mg Oral Daily  . diltiazem  120 mg Oral Daily  . feeding supplement (ENSURE ENLIVE)  237 mL Oral BID BM  . metoprolol succinate  100 mg Oral Daily  . pantoprazole  40 mg Oral Daily  . ramipril  5 mg Oral Daily    Infusions: . sodium chloride 10 mL/hr (08/02/15 1727)  . heparin 700 Units/hr (08/02/15 2100)    PRN Medications: nitroGLYCERIN, ondansetron **OR** ondansetron (ZOFRAN) IV   Past Medical History  Diagnosis Date  . Essential hypertension, benign   .  Palpitations   . Lumbar disc disease   . Osteoporosis   . GERD (gastroesophageal reflux disease)   . Anxiety   . History of DVT (deep vein thrombosis)   . Renal insufficiency   . Macular degeneration   . Uterine prolapse     Past Surgical History  Procedure Laterality Date  . Cholecystectomy    . Knee arthroscopy      Right  . Cervical polypectomy      Excision of interest cervical/ endometrial lesion  . Finger ganglion cyst excision      Mucoid cyst, left middle finger  . Vitrectomy      Partial, left eye    Family History  Problem Relation Age of Onset  . Cancer Paternal Aunt   . Stroke Paternal Uncle     Social History [name] reports that she quit smoking about 64 years ago. Her smoking use included Cigarettes. She started smoking about 84 years ago. She smoked 0.10 packs per day. She has never used smokeless tobacco. [name] reports that she does not drink alcohol.  Review of Systems Complete review of systems negative except as otherwise outlined in the clinical summary and also the following. Decreased hearing, arthritic stiffness.  Physical Examination Blood pressure 152/117, pulse 68, temperature 97.1 F (36.2 C), temperature source Oral, resp. rate 20, height 5' (1.524 m), weight 145 lb 8.1 oz (66 kg), SpO2 97 %.  Intake/Output Summary (Last 24 hours) at 08/03/15 0901 Last data filed at 08/03/15 0700  Gross per 24  hour  Intake  323.1 ml  Output   1225 ml  Net -901.9 ml   Telemetry: Sinus rhythm with PACs and motion artifact.  Gen.: Elderly woman, no distress. HEENT: Conjunctiva and lids normal, oropharynx clear. Neck: Supple, no elevated JVP or carotid bruits, no thyromegaly. Lungs: Clear to auscultation, nonlabored breathing at rest. Cardiac: Regular rate and rhythm, no S3, 3/6 systolic murmur, no pericardial rub. Abdomen: Soft, nontender, bowel sounds present, no guarding or rebound. Extremities: No pitting edema, distal pulses 2+. Skin: Warm  and dry. Musculoskeletal: Kyphosis present Neuropsychiatric: Alert and oriented x3, affect grossly appropriate. Diminished hearing.   Lab Results  Basic Metabolic Panel:  Recent Labs Lab 08/02/15 1707  NA 135  K 4.1  CL 104  CO2 25  GLUCOSE 123*  BUN 24*  CREATININE 1.48*  CALCIUM 8.6*    Liver Function Tests:  Recent Labs Lab 08/02/15 1707  AST 17  ALT 10*  ALKPHOS 69  BILITOT 0.5  PROT 6.5  ALBUMIN 3.4*    CBC:  Recent Labs Lab 08/02/15 1707 08/03/15 0426  WBC 6.8 6.2  HGB 11.2* 11.2*  HCT 35.1* 34.9*  MCV 99.2 98.3  PLT 195 197    Cardiac Enzymes:  Recent Labs Lab 08/02/15 1707 08/02/15 2147 08/03/15 0426  TROPONINI 0.03* 0.03* 0.03*    ECG I personally reviewed the tracing from 08/03/2015 which showed sinus rhythm with anterior T-wave inversions that are new compared to the previous tracing from May 2016, suggestive of ischemia.  Imaging Chest x-ray 08/02/2015: FINDINGS: Lung volumes are low. Mild cardiomegaly, similar to prior exam. Perihilar bronchial thickening appears chronic. There is increased right infrahilar atelectasis. Possible vascular congestion versus bronchovascular crowding. No large pleural effusion or pneumothorax. Remote right rib fractures. Chronic change about both shoulders.  IMPRESSION: Low lung volumes. Stable cardiomegaly and chronic bronchial thickening. Vascular congestion versus bronchovascular crowding.  Impression  1. Palpitations and chest pain, likely angina. She has aortic stenosis based on physical examination which could be contributing, also ECG suggestive of anterior ischemic changes and underlying CAD. No definitive ACS by troponin I levels which are 0.03 in flat pattern.  2. Long-standing history of palpitations as an outpatient. She has been relatively stable over time on combination of Toprol-XL and Cardizem CD. Rhythm is sinus at this time.  3. Essential hypertension, blood pressure is  currently well controlled.  4. CKD stage 3, creatinine 1.5.   Recommendations  Discussed with patient and family members including son, oldest daughter, and son-in-law at bedside. We have discussed obtaining an echocardiogram mainly for diagnostic purposes has aortic stenosis is already suspected, possibly of significant degree. She has had somewhat of a general decline over the last year, falls are increasing. She lives in her own apartment, and I did discuss with the family considering other options for care, and meeting with the Case Manager. Do not plan to pursue aggressive workup in terms of cardiac catheterization or other invasive testing, which have been the patient's wishes over time anyway. We will try to treat her medically. We will plan to stop heparin tomorrow which will be approximately 48 hours treatment. Continue aspirin, Cardizem CD, and Toprol-XL. She is on Altace as well. Might consider nitrates depending on symptoms and overall blood pressure as well as degree of aortic stenosis. ECG in a.m.  Jonelle Sidle, M.D., F.A.C.C.

## 2015-08-03 NOTE — Evaluation (Signed)
Physical Therapy Evaluation Patient Details Name: Kristin NordmannSara O Lanter MRN: [id number] DOB: 06/27/1917 Today's Date: 08/03/2015   History of Present Illness  82 yo F admitted with chest pain s/p fall the previous day. Dx: palpitations and chest pain, likely angina. She has aortic stenosis based on physical examination which could be contributing, also ECG suggestive of anterior ischemic changes and underlying CAD. No definitive ACS by troponin levesl which are 0.03 in flat pattern. PMH: HTN, lumbar disc disease, osteoporosis, GERD, anxiety, DVT, renal insufficiency, macular degeneration, cholecystectomy, R knee arthroscopy.  Clinical Impression  Pt received in bed, and was agreeable to PT evaluation.  Pt's aid stopped in during PT evaluation.  Prior to admission, pt was modified independent with ambulation, as well as ADL's.  She has an aide that comes 5 days a week for 3-4 hours per day.  During today's evaluation she required Min A for supine<>sit, and Min guard for sit<>stand and ambulation x 6425ft with RW and Min guard.  At this point, pt would benefit from HHPT upon d/c.     Follow Up Recommendations Home health PT    Equipment Recommendations  None recommended by PT    Recommendations for Other Services       Precautions / Restrictions Precautions Precaution Comments: cardiac  Restrictions Weight Bearing Restrictions: No      Mobility  Bed Mobility Overal bed mobility: Needs Assistance Bed Mobility: Supine to Sit     Supine to sit: Min assist        Transfers Overall transfer level: Needs assistance Equipment used: Rolling walker (2 wheeled) Transfers: Sit to/from Stand Sit to Stand: Min assist            Ambulation/Gait Ambulation/Gait assistance: Min assist Ambulation Distance (Feet): 20 Feet Assistive device: Rolling walker (2 wheeled) Gait Pattern/deviations: Step-to pattern;Trunk flexed     General Gait Details: vc's to slow down and to stay closer to the  base of the RW.   Stairs            Wheelchair Mobility    Modified Rankin (Stroke Patients Only)       Balance Overall balance assessment: Needs assistance         Standing balance support: Bilateral upper extremity supported Standing balance-Leahy Scale: Fair                               Pertinent Vitals/Pain Pain Assessment: 0-10 Pain Score: 3  Pain Location: heart Pain Descriptors / Indicators: Aching Pain Intervention(s): Limited activity within patient's tolerance    Home Living   Living Arrangements: Alone Available Help at Discharge:  (aide that comes 5 days a week.  3-4 hours per day ) Type of Home: Apartment Home Access: Level entry     Home Layout: One level Home Equipment: Walker - 4 wheels;Cane - single point;Bedside commode;Shower seat - built in      Prior Function Level of Independence: Needs assistance   Gait / Transfers Assistance Needed: Pt uses a rollator to walk.    ADL's / Homemaking Assistance Needed: aide helps with bathing, cleaning, and driving.  Depending on how she is feeling, the pt will go with the aid to the store.         Hand Dominance   Dominant Hand: Right    Extremity/Trunk Assessment  Communication   Communication: No difficulties  Cognition Arousal/Alertness: Awake/alert Behavior During Therapy: WFL for tasks assessed/performed Overall Cognitive Status: Within Functional Limits for tasks assessed                      General Comments      Exercises        Assessment/Plan    PT Assessment Patient needs continued PT services  PT Diagnosis Generalized weakness;Acute pain;Difficulty walking;Abnormality of gait   PT Problem List Decreased strength;Decreased activity tolerance;Decreased balance;Decreased mobility;Decreased coordination;Decreased cognition;Decreased knowledge of use of DME;Decreased knowledge of precautions;Cardiopulmonary status  limiting activity;Decreased safety awareness;Pain  PT Treatment Interventions Gait training;Functional mobility training;Therapeutic activities;Therapeutic exercise;Balance training;Patient/family education;DME instruction   PT Goals (Current goals can be found in the Care Plan section) Acute Rehab PT Goals Patient Stated Goal: To be strong enough to go home.  PT Goal Formulation: With patient Time For Goal Achievement: 08/10/15 Potential to Achieve Goals: Good    Frequency Min 3X/week   Barriers to discharge        Co-evaluation               End of Session Equipment Utilized During Treatment: Gait belt Activity Tolerance: Patient limited by pain Patient left: in chair;with call bell/phone within reach;with family/visitor present Nurse Communication: Mobility status    Functional Assessment Tool Used: [employer] "6-clicks"  Functional Limitation: Mobility: Walking and moving around Mobility: Walking and Moving Around Current Status [phone]): At least 40 percent but less than 60 percent impaired, limited or restricted Mobility: Walking and Moving Around Goal Status [phone]): At least 20 percent but less than 40 percent impaired, limited or restricted    Time: 2956-2130 PT Time Calculation (min) (ACUTE ONLY): 32 min   Charges:   PT Evaluation $PT Eval Moderate Complexity: 1 Procedure PT Treatments $Gait Training: 8-22 mins   PT G Codes:   PT G-Codes **NOT FOR INPATIENT CLASS** Functional Assessment Tool Used: [employer] "6-clicks"  Functional Limitation: Mobility: Walking and moving around Mobility: Walking and Moving Around Current Status [phone]): At least 40 percent but less than 60 percent impaired, limited or restricted Mobility: Walking and Moving Around Goal Status [phone]): At least 20 percent but less than 40 percent impaired, limited or restricted    Beth Kamar Callender, PT, DPT X: [phone]

## 2015-08-03 NOTE — Progress Notes (Signed)
ANTICOAGULATION CONSULT NOTE - follow up  Pharmacy Consult for Heparin Indication: chest pain/ACS  Allergies  Allergen Reactions  . Amoxicillin Rash    rash   Patient Measurements: Height: 5' (152.4 cm) Weight: 145 lb 8.1 oz (66 kg) IBW/kg (Calculated) : 45.5 HEPARIN DW (KG): 58.3  Vital Signs: Temp: 97.1 F (36.2 C) (07/18 0749) Temp Source: Oral (07/18 0749) BP: 152/117 mmHg (07/18 0600) Pulse Rate: 68 (07/18 0600)  Labs:  Recent Labs  08/02/15 1707 08/02/15 2147 08/03/15 0152 08/03/15 0426  HGB 11.2*  --   --  11.2*  HCT 35.1*  --   --  34.9*  PLT 195  --   --  197  APTT 32  --   --   --   LABPROT 13.5  --   --   --   INR 1.01  --   --   --   HEPARINUNFRC  --   --  0.31  --   CREATININE 1.48*  --   --   --   TROPONINI 0.03* 0.03*  --  0.03*   Estimated Creatinine Clearance: 18.8 mL/min (by C-G formula based on Cr of 1.48).  Medical History: Past Medical History  Diagnosis Date  . Essential hypertension, benign   . Palpitations   . Lumbar disc disease   . Osteoporosis   . GERD (gastroesophageal reflux disease)   . Anxiety   . History of DVT (deep vein thrombosis)   . Renal insufficiency   . Macular degeneration   . Uterine prolapse   . FH: osteoporosis 10/31/2010   Medications:  Prescriptions prior to admission  Medication Sig Dispense Refill Last Dose  . aspirin 325 MG EC tablet Take 325 mg by mouth daily.   08/02/2015 at Unknown time  . citalopram (CELEXA) 10 MG tablet Take 10 mg by mouth daily.   08/02/2015 at Unknown time  . diltiazem (CARDIZEM CD) 120 MG 24 hr capsule TAKE (1) CAPSULE BY MOUTH ONCE DAILY. 30 capsule 6 08/02/2015 at Unknown time  . hydrochlorothiazide (MICROZIDE) 12.5 MG capsule Take 12.5 mg by mouth daily.    08/02/2015 at Unknown time  . Ibandronate Sodium (BONIVA IV) Inject into the vein every 3 (three) months.    07/14/2015  . metoprolol (TOPROL-XL) 100 MG 24 hr tablet Take 100 mg by mouth daily.     08/01/2015 at 2200  .  Multiple Vitamins-Minerals (ICAPS) CAPS Take 1 capsule by mouth daily.     08/02/2015 at Unknown time  . pantoprazole (PROTONIX) 40 MG tablet Take 40 mg by mouth daily.     08/02/2015 at Unknown time  . ramipril (ALTACE) 5 MG capsule TAKE ONE CAPSULE BY MOUTH ONCE DAILY. 30 capsule 3 08/02/2015 at Unknown time   Assessment: 82 yo female admitted directly from MD's office with chest pain. Start heparin for ACS.  Heparin level is therapeutic.   Goal of Therapy:  Heparin level 0.3-0.7 units/ml Monitor platelets by anticoagulation protocol: Yes   Plan:  Continue Heparin at 700 units/hr Monitor HL daily and CBC  Valrie HartScott Nakhi Choi, PharmD Clinical Pharmacist Pager:  [phone] 08/03/2015 7:53 AM

## 2015-08-03 NOTE — Progress Notes (Signed)
*  PRELIMINARY RESULTS* Echocardiogram 2D Echocardiogram has been performed.  Stacey DrainWhite, Natalee Tomkiewicz J 08/03/2015, 11:31 AM

## 2015-08-03 NOTE — Progress Notes (Signed)
Subjective: She has noted intermittent palpitations but no angina.  Objective: Vital signs in last 24 hours: Filed Vitals:   08/03/15 0300 08/03/15 0400 08/03/15 0500 08/03/15 0600  BP: 123/51 152/50 155/49 152/117  Pulse: 66 61 63 68  Temp:  97.1 F (36.2 C)    TempSrc:  Oral    Resp: Height:      Weight:   145 lb 8.1 oz (66 kg)   SpO2: 94% 100% 95% 97%   Weight change:   Intake/Output Summary (Last 24 hours) at 08/03/15 0746 Last data filed at 08/03/15 0700  Gross per 24 hour  Intake  323.1 ml  Output   1225 ml  Net -901.9 ml    Physical Exam: Alert. No distress. Lungs clear. Heart regular with a grade 2 systolic murmur. Abdomen nontender with no hepatosplenomegaly.  Lab Results:    Results for orders placed or performed during the hospital encounter of 08/02/15 (from the past 24 hour(s))  MRSA PCR Screening     Status: None   Collection Time: 08/02/15  4:00 PM  Result Value Ref Range   MRSA by PCR NEGATIVE NEGATIVE  CBC     Status: Abnormal   Collection Time: 08/02/15  5:07 PM  Result Value Ref Range   WBC 6.8 4.0 - 10.5 K/uL   RBC 3.54 (L) 3.87 - 5.11 MIL/uL   Hemoglobin 11.2 (L) 12.0 - 15.0 g/dL   HCT 65.7 (L) 84.6 - 96.2 %   MCV 99.2 78.0 - 100.0 fL   MCH 31.6 26.0 - 34.0 pg   MCHC 31.9 30.0 - 36.0 g/dL   RDW 95.2 84.1 - 32.4 %   Platelets 195 150 - 400 K/uL  Comprehensive metabolic panel     Status: Abnormal   Collection Time: 08/02/15  5:07 PM  Result Value Ref Range   Sodium 135 135 - 145 mmol/L   Potassium 4.1 3.5 - 5.1 mmol/L   Chloride 104 101 - 111 mmol/L   CO2 25 22 - 32 mmol/L   Glucose, Bld 123 (H) 65 - 99 mg/dL   BUN 24 (H) 6 - 20 mg/dL   Creatinine, Ser 4.01 (H) 0.44 - 1.00 mg/dL   Calcium 8.6 (L) 8.9 - 10.3 mg/dL   Total Protein 6.5 6.5 - 8.1 g/dL   Albumin 3.4 (L) 3.5 - 5.0 g/dL   AST 17 15 - 41 U/L   ALT 10 (L) 14 - 54 U/L   Alkaline Phosphatase 69 38 - 126 U/L   Total Bilirubin 0.5 0.3 - 1.2 mg/dL   GFR calc non Af  Amer 29 (L) >60 mL/min   GFR calc Af Amer 33 (L) >60 mL/min   Anion gap 6 5 - 15  Troponin I     Status: Abnormal   Collection Time: 08/02/15  5:07 PM  Result Value Ref Range   Troponin I 0.03 (HH) <0.03 ng/mL  APTT     Status: None   Collection Time: 08/02/15  5:07 PM  Result Value Ref Range   aPTT 32 24 - 37 seconds  Protime-INR     Status: None   Collection Time: 08/02/15  5:07 PM  Result Value Ref Range   Prothrombin Time 13.5 11.6 - 15.2 seconds   INR 1.01 0.00 - 1.49  Troponin I     Status: Abnormal   Collection Time: 08/02/15  9:47 PM  Result Value Ref Range   Troponin I 0.03 (HH) <0.03 ng/mL  Heparin level (unfractionated)     Status: None   Collection Time: 08/03/15  1:52 AM  Result Value Ref Range   Heparin Unfractionated 0.31 0.30 - 0.70 IU/mL  Troponin I     Status: Abnormal   Collection Time: 08/03/15  4:26 AM  Result Value Ref Range   Troponin I 0.03 (HH) <0.03 ng/mL  CBC     Status: Abnormal   Collection Time: 08/03/15  4:26 AM  Result Value Ref Range   WBC 6.2 4.0 - 10.5 K/uL   RBC 3.55 (L) 3.87 - 5.11 MIL/uL   Hemoglobin 11.2 (L) 12.0 - 15.0 g/dL   HCT 16.1 (L) 09.6 - 04.5 %   MCV 98.3 78.0 - 100.0 fL   MCH 31.5 26.0 - 34.0 pg   MCHC 32.1 30.0 - 36.0 g/dL   RDW 40.9 81.1 - 91.4 %   Platelets 197 150 - 400 K/uL     ABGS No results for input(s): PHART, PO2ART, TCO2, HCO3 in the last 72 hours.  Invalid input(s): PCO2 CULTURES Recent Results (from the past 240 hour(s))  MRSA PCR Screening     Status: None   Collection Time: 08/02/15  4:00 PM  Result Value Ref Range Status   MRSA by PCR NEGATIVE NEGATIVE Final    Comment:        The GeneXpert MRSA Assay (FDA approved for NASAL specimens only), is one component of a comprehensive MRSA colonization surveillance program. It is not intended to diagnose MRSA infection nor to guide or monitor treatment for MRSA infections.    Studies/Results: Portable Chest 1 View  08/02/2015  CLINICAL DATA:   Chest pain. EXAM: PORTABLE CHEST 1 VIEW COMPARISON:  05/14/2013, 12/12/2010 FINDINGS: Lung volumes are low. Mild cardiomegaly, similar to prior exam. Perihilar bronchial thickening appears chronic. There is increased right infrahilar atelectasis. Possible vascular congestion versus bronchovascular crowding. No large pleural effusion or pneumothorax. Remote right rib fractures. Chronic change about both shoulders. IMPRESSION: Low lung volumes. Stable cardiomegaly and chronic bronchial thickening. Vascular congestion versus bronchovascular crowding. Electronically Signed   By: Rubye Oaks M.D.   On: 08/02/2015 18:11   Micro Results: Recent Results (from the past 240 hour(s))  MRSA PCR Screening     Status: None   Collection Time: 08/02/15  4:00 PM  Result Value Ref Range Status   MRSA by PCR NEGATIVE NEGATIVE Final    Comment:        The GeneXpert MRSA Assay (FDA approved for NASAL specimens only), is one component of a comprehensive MRSA colonization surveillance program. It is not intended to diagnose MRSA infection nor to guide or monitor treatment for MRSA infections.    Studies/Results: Portable Chest 1 View  08/02/2015  CLINICAL DATA:  Chest pain. EXAM: PORTABLE CHEST 1 VIEW COMPARISON:  05/14/2013, 12/12/2010 FINDINGS: Lung volumes are low. Mild cardiomegaly, similar to prior exam. Perihilar bronchial thickening appears chronic. There is increased right infrahilar atelectasis. Possible vascular congestion versus bronchovascular crowding. No large pleural effusion or pneumothorax. Remote right rib fractures. Chronic change about both shoulders. IMPRESSION: Low lung volumes. Stable cardiomegaly and chronic bronchial thickening. Vascular congestion versus bronchovascular crowding. Electronically Signed   By: Rubye Oaks M.D.   On: 08/02/2015 18:11   Medications:  I have reviewed the patient's current medications Scheduled Meds: . aspirin EC  81 mg Oral Daily  . citalopram  10  mg Oral Daily  . diltiazem  120 mg Oral Daily  . feeding supplement (ENSURE ENLIVE)  237 mL  Oral BID BM  . metoprolol succinate  100 mg Oral Daily  . pantoprazole  40 mg Oral Daily  . ramipril  5 mg Oral Daily   Continuous Infusions: . sodium chloride 10 mL/hr (08/02/15 1727)  . heparin 700 Units/hr (08/02/15 2100)   PRN Meds:.nitroGLYCERIN, ondansetron **OR** ondansetron (ZOFRAN) IV   Assessment/Plan: #1. Chest pain. Troponins are 0.033. EKG reveals normal sinus rhythm with T-wave inversions across the precordium. Echo ordered for today. Cardiology consult ordered. Continue intravenous heparin for now. Continue aspirin and metoprolol. #2. Palpitations. Continue metoprolol and diltiazem. #3. Chronic kidney disease stage IV. Creatinine is stable. Active Problems:   Chest pain     LOS: 1 day   Cael Worth 08/03/2015, 7:46 AM

## 2015-08-03 NOTE — H&P (Signed)
PCP:   Asencion Noble, MD   Chief Complaint:  Chest pain  HPI: This patient is a 82 year old female who presented to the office for evaluation of recurrent chest pain over the past day. She described recurrent sharp anterior chest pain without radiation. Episodes typically lasted no longer than 1 minute. She denied diaphoresis, shortness of breath or nausea or vomiting. She had fallen the day before and her symptoms had begun after the fall. She denied any loss of consciousness. Her EKG at the office revealed normal sinus rhythm with new T-wave inversions in her anterior leads. She is treated for hypertension. She does not have diabetes. She does not smoke. She does not have hyperlipidemia. She has been felt to likely have aortic stenosis based on physical exam but further evaluation of this has not been undertaken per her wishes. She is followed chronically in the cardiology clinic by Dr. Domenic Polite for palpitations.    Past Medical History  Diagnosis Date  . Essential hypertension, benign   . Palpitations   . Lumbar disc disease   . Osteoporosis   . GERD (gastroesophageal reflux disease)   . Anxiety   . History of DVT (deep vein thrombosis)   . Renal insufficiency   . Macular degeneration   . Uterine prolapse   . FH: osteoporosis 10/31/2010     Past Surgical History  Procedure Laterality Date  . Cholecystectomy    . Knee arthroscopy      Right  . Cervical polypectomy      Excision of interest cervical/ endometrial lesion  . Finger ganglion cyst excision      Mucoid cyst, left middle finger  . Vitrectomy      Partial, left eye     Prior to Admission medications   Medication Sig Start Date End Date Taking? Authorizing Provider  aspirin 325 MG EC tablet Take 325 mg by mouth daily.   Yes Historical Provider, MD  citalopram (CELEXA) 10 MG tablet Take 10 mg by mouth daily.   Yes Historical Provider, MD  diltiazem (CARDIZEM CD) 120 MG 24 hr capsule TAKE (1)  CAPSULE BY MOUTH ONCE DAILY. 05/14/15  Yes Satira Sark, MD  hydrochlorothiazide (MICROZIDE) 12.5 MG capsule Take 12.5 mg by mouth daily.    Yes Historical Provider, MD  Ibandronate Sodium (BONIVA IV) Inject into the vein every 3 (three) months.    Yes Historical Provider, MD  metoprolol (TOPROL-XL) 100 MG 24 hr tablet Take 100 mg by mouth daily.     Yes Historical Provider, MD  Multiple Vitamins-Minerals (ICAPS) CAPS Take 1 capsule by mouth daily.     Yes Historical Provider, MD  pantoprazole (PROTONIX) 40 MG tablet Take 40 mg by mouth daily.     Yes Historical Provider, MD  ramipril (ALTACE) 5 MG capsule TAKE ONE CAPSULE BY MOUTH ONCE DAILY. 07/06/15  Yes Satira Sark, MD      Allergies:   Allergies  Allergen Reactions  . Amoxicillin Rash    rash     Social History:  reports that she quit smoking about 64 years ago. Her smoking use included Cigarettes. She started smoking about 84 years ago. She smoked 0.10 packs per day. She has never used smokeless tobacco. She reports that she does not drink alcohol or use illicit drugs.  Family History  Problem Relation Age of Onset  . Cancer Paternal Aunt   . Stroke  Paternal Uncle     Review of Systems:  Review of systems reveals no syncope, difficulty breathing, fever, abdominal pain, change in bowel habits, difficulty eating, nausea or vomiting. '@NORROS'$ @  Physical Exam: Blood pressure 152/117, pulse 68, temperature 97.1 F (36.2 C), temperature source Oral, resp. rate 20, height 5' (1.524 m), weight 145 lb 8.1 oz (66 kg), SpO2 97 %. Alert. No distress. HEENT: No scleral icterus. Pharynx normal. Neck is supple with no JVD or thyromegaly. Lungs clear. Heart regular with a grade 2 systolic murmur. Chest wall is nontender. Abdomen is nontender with no hepatosplenomegaly. Extremities reveal normal pulses with no edema. Neuro exam reveals no focal weakness. Gait at baseline. Lymph nodes reveal no enlargement. Skin warm and dry.  Labs  on Admission:  Results for orders placed or performed during the hospital encounter of 08/02/15 (from the past 48 hour(s))  MRSA PCR Screening     Status: None   Collection Time: 08/02/15  4:00 PM  Result Value Ref Range   MRSA by PCR NEGATIVE NEGATIVE    Comment:        The GeneXpert MRSA Assay (FDA approved for NASAL specimens only), is one component of a comprehensive MRSA colonization surveillance program. It is not intended to diagnose MRSA infection nor to guide or monitor treatment for MRSA infections.   CBC     Status: Abnormal   Collection Time: 08/02/15  5:07 PM  Result Value Ref Range   WBC 6.8 4.0 - 10.5 K/uL   RBC 3.54 (L) 3.87 - 5.11 MIL/uL   Hemoglobin 11.2 (L) 12.0 - 15.0 g/dL   HCT 35.1 (L) 36.0 - 46.0 %   MCV 99.2 78.0 - 100.0 fL   MCH 31.6 26.0 - 34.0 pg   MCHC 31.9 30.0 - 36.0 g/dL   RDW 14.0 11.5 - 15.5 %   Platelets 195 150 - 400 K/uL  Comprehensive metabolic panel     Status: Abnormal   Collection Time: 08/02/15  5:07 PM  Result Value Ref Range   Sodium 135 135 - 145 mmol/L   Potassium 4.1 3.5 - 5.1 mmol/L   Chloride 104 101 - 111 mmol/L   CO2 25 22 - 32 mmol/L   Glucose, Bld 123 (H) 65 - 99 mg/dL   BUN 24 (H) 6 - 20 mg/dL   Creatinine, Ser 1.48 (H) 0.44 - 1.00 mg/dL   Calcium 8.6 (L) 8.9 - 10.3 mg/dL   Total Protein 6.5 6.5 - 8.1 g/dL   Albumin 3.4 (L) 3.5 - 5.0 g/dL   AST 17 15 - 41 U/L   ALT 10 (L) 14 - 54 U/L   Alkaline Phosphatase 69 38 - 126 U/L   Total Bilirubin 0.5 0.3 - 1.2 mg/dL   GFR calc non Af Amer 29 (L) >60 mL/min   GFR calc Af Amer 33 (L) >60 mL/min    Comment: (NOTE) The eGFR has been calculated using the CKD EPI equation. This calculation has not been validated in all clinical situations. eGFR's persistently <60 mL/min signify possible Chronic Kidney Disease.    Anion gap 6 5 - 15  Troponin I     Status: Abnormal   Collection Time: 08/02/15  5:07 PM  Result Value Ref Range   Troponin I 0.03 (HH) <0.03 ng/mL     Comment: CRITICAL RESULT CALLED TO, READ BACK BY AND VERIFIED WITH: CHAPELLE,R ON 08/02/15 AT 1810 BY LOY,C   APTT     Status: None   Collection Time: 08/02/15  5:07 PM  Result Value Ref Range   aPTT 32 24 - 37 seconds  Protime-INR     Status: None   Collection Time: 08/02/15  5:07 PM  Result Value Ref Range   Prothrombin Time 13.5 11.6 - 15.2 seconds   INR 1.01 0.00 - 1.49  Troponin I     Status: Abnormal   Collection Time: 08/02/15  9:47 PM  Result Value Ref Range   Troponin I 0.03 (HH) <0.03 ng/mL    Comment: CRITICAL VALUE NOTED.  VALUE IS CONSISTENT WITH PREVIOUSLY REPORTED AND CALLED VALUE. DELTA CHECK NOTED   Heparin level (unfractionated)     Status: None   Collection Time: 08/03/15  1:52 AM  Result Value Ref Range   Heparin Unfractionated 0.31 0.30 - 0.70 IU/mL    Comment:        IF HEPARIN RESULTS ARE BELOW EXPECTED VALUES, AND PATIENT DOSAGE HAS BEEN CONFIRMED, SUGGEST FOLLOW UP TESTING OF ANTITHROMBIN III LEVELS. Performed at [facility]   Troponin I     Status: Abnormal   Collection Time: 08/03/15  4:26 AM  Result Value Ref Range   Troponin I 0.03 (HH) <0.03 ng/mL    Comment: CRITICAL VALUE NOTED.  VALUE IS CONSISTENT WITH PREVIOUSLY REPORTED AND CALLED VALUE.  CBC     Status: Abnormal   Collection Time: 08/03/15  4:26 AM  Result Value Ref Range   WBC 6.2 4.0 - 10.5 K/uL   RBC 3.55 (L) 3.87 - 5.11 MIL/uL   Hemoglobin 11.2 (L) 12.0 - 15.0 g/dL   HCT 34.9 (L) 36.0 - 46.0 %   MCV 98.3 78.0 - 100.0 fL   MCH 31.5 26.0 - 34.0 pg   MCHC 32.1 30.0 - 36.0 g/dL   RDW 13.9 11.5 - 15.5 %   Platelets 197 150 - 400 K/uL    Radiological Exams on Admission: Portable Chest 1 View  08/02/2015  CLINICAL DATA:  Chest pain. EXAM: PORTABLE CHEST 1 VIEW COMPARISON:  05/14/2013, 12/12/2010 FINDINGS: Lung volumes are low. Mild cardiomegaly, similar to prior exam. Perihilar bronchial thickening appears chronic. There is increased right infrahilar atelectasis. Possible  vascular congestion versus bronchovascular crowding. No large pleural effusion or pneumothorax. Remote right rib fractures. Chronic change about both shoulders. IMPRESSION: Low lung volumes. Stable cardiomegaly and chronic bronchial thickening. Vascular congestion versus bronchovascular crowding. Electronically Signed   By: Jeb Levering M.D.   On: 08/02/2015 18:11    Assessment/Plan Active Problems:   Chest pain   #1. Chest pain with new EKG changes. She was admitted to stepdown. Aspirin and metoprolol were continued. Intravenous heparin was started. We'll obtain troponins 3 and a follow-up EKG. Plan echocardiogram and cardiology consultation. #2. Probable aortic stenosis. Evaluate with echo. #3. Chronic kidney disease. Stable. #4. Hypertension. Continue metoprolol, diltiazem and all taste. Hold hydrochlorothiazide. #5. Chronic palpitations. Continue metoprolol and diltiazem.  Evah Rashid    08/03/2015

## 2015-08-03 NOTE — Progress Notes (Signed)
Initial Nutrition Assessment  DOCUMENTATION CODES:  Not applicable  INTERVENTION:  Magic cup Q24, each supplement provides 290 kcal and 9 grams of protein  Pt took stool softeners at home, placing on one now may be helpful for constipation prevention. Last BM 7/16  MVI with minerals due to poor PO intake and no supplementation at home  NUTRITION DIAGNOSIS:  Unintentional weight loss related to poor appetite as evidenced by apparent loss of ~5% bw in 2 months, 6.5% x 1 year  GOAL:  Patient will meet greater than or equal to 90% of their needs  MONITOR:  PO intake, Supplement acceptance, Labs  REASON FOR ASSESSMENT:  Malnutrition Screening Tool    ASSESSMENT:  82 y/o female PMHx HTN, GERD, Anxiety. Presented with recurrent chest pain x 1 day, had fallen day before. Admitted for evaluation of chest pain, thought to reflect likely aortic stenosis.   Mostly spoke with patients family members as pt was being cleaned up. They report a progressive loss of appetite consistent with age related anorexia. Pt was never a large eater, but her PO intake has declined to the point where one of family members described her intake as "unable to feed a small bird". He does say that pt expends very little energy and essentially will just watch tv the whole day.   RD explained that loss of hunger/appetite is part of physiological response to aging and reccommended supplements to help slow wt loss. Acknowledged that patient is 82 years old at pt should not feel pushed into drinking them if she would not like.   Family reports patient did not follow any type of diet. They thought pt was lactose intolerant, but pt later denied this. Family states she loves ice cream.  Family member believes pt does have constipation as he sees her buy stools softeners  Family gives UBW as ~150 lbs, though agree that pt likely has fallen 10 lbs in last year.  Family disputes her initial weight of 136 lbs.   Using 145, She  appears to have lost just under 5% bw in 2 months, Not clinically significant.   Later, after pt was cleaned, briefly spoke to. She denies any trouble eating. When mentioned adding a supplement she was overwhelmed and stated she would not be able to her meal and her supplements. RD simply stated she can eat whichever she prefers and starting with meals would be fine. She does not need to feel pressured to consume supplement.   NFPE: Wasting thought to be largely related to age related sarcopenia, not malnutrition  Labs reviewed: Albumin: 3.4, BUN/Creat: 1.48/8.6   Recent Labs Lab 08/02/15 1707  NA 135  K 4.1  CL 104  CO2 25  BUN 24*  CREATININE 1.48*  CALCIUM 8.6*  GLUCOSE 123*     Diet Order:  Diet Heart Room service appropriate?: Yes; Fluid consistency:: Thin  Skin: Ecchymotic hip  Last BM:  7/16  Height:  Ht Readings from Last 1 Encounters:  08/02/15 5' (1.524 m)   Weight:  Wt Readings from Last 1 Encounters:  08/03/15 145 lb 8.1 oz (66 kg)   Wt Readings from Last 10 Encounters:  08/03/15 145 lb 8.1 oz (66 kg)  06/16/15 152 lb (68.947 kg)  03/10/15 156 lb (70.761 kg)  01/13/15 156 lb (70.761 kg)  12/06/14 155 lb (70.308 kg)  12/03/14 155 lb (70.308 kg)  09/02/14 156 lb (70.761 kg)  07/04/14 155 lb (70.308 kg)  06/02/14 157 lb (71.215 kg)  02/02/14  157 lb (71.215 kg)   Ideal Body Weight:  45.45 kg  BMI:  Body mass index is 28.42 kg/(m^2).  Estimated Nutritional Needs:  Kcal:  1350-1550 (20-23 kcal/kg bw) Protein:  45-55 g Pro (1-1.2 g/kg ibw) Fluid:  1.4-1.6 liters fluid  EDUCATION NEEDS:  No education needs identified at this time  Christophe Louis RD, LDN, CNSC Clinical Nutrition Pager: [phone] 08/03/2015 12:28 PM

## 2015-08-03 NOTE — Care Management Obs Status (Signed)
MEDICARE OBSERVATION STATUS NOTIFICATION   Patient Details  Name: Nancy NordmannSara O Herberger MRN: [id number] Date of Birth: 10/02/1917   Medicare Observation Status Notification Given:  Yes    Fuller PlanWelborn, Jaydon Avina M, RN 08/03/2015, 2:07 PM

## 2015-08-04 DIAGNOSIS — I129 Hypertensive chronic kidney disease with stage 1 through stage 4 chronic kidney disease, or unspecified chronic kidney disease: Secondary | ICD-10-CM | POA: Diagnosis not present

## 2015-08-04 DIAGNOSIS — N184 Chronic kidney disease, stage 4 (severe): Secondary | ICD-10-CM | POA: Diagnosis not present

## 2015-08-04 DIAGNOSIS — I200 Unstable angina: Secondary | ICD-10-CM | POA: Diagnosis not present

## 2015-08-04 DIAGNOSIS — I350 Nonrheumatic aortic (valve) stenosis: Secondary | ICD-10-CM | POA: Diagnosis not present

## 2015-08-04 LAB — CBC
HEMATOCRIT: 32.1 % — AB (ref 36.0–46.0)
Hemoglobin: 10.6 g/dL — ABNORMAL LOW (ref 12.0–15.0)
MCH: 32.4 pg (ref 26.0–34.0)
MCHC: 33 g/dL (ref 30.0–36.0)
MCV: 98.2 fL (ref 78.0–100.0)
PLATELETS: 195 10*3/uL (ref 150–400)
RBC: 3.27 MIL/uL — ABNORMAL LOW (ref 3.87–5.11)
RDW: 13.9 % (ref 11.5–15.5)
WBC: 6.3 10*3/uL (ref 4.0–10.5)

## 2015-08-04 LAB — HEPARIN LEVEL (UNFRACTIONATED): HEPARIN UNFRACTIONATED: 0.21 [IU]/mL — AB (ref 0.30–0.70)

## 2015-08-04 MED ORDER — RAMIPRIL 1.25 MG PO CAPS
2.5000 mg | ORAL_CAPSULE | Freq: Every day | ORAL | Status: DC
  Administered 2015-08-04 – 2015-08-05 (×2): 2.5 mg via ORAL
  Filled 2015-08-04 (×2): qty 2

## 2015-08-04 MED ORDER — ISOSORBIDE MONONITRATE ER 30 MG PO TB24
15.0000 mg | ORAL_TABLET | Freq: Every day | ORAL | Status: DC
  Administered 2015-08-04 – 2015-08-05 (×2): 15 mg via ORAL
  Filled 2015-08-04 (×2): qty 1

## 2015-08-04 NOTE — Progress Notes (Signed)
Physical Therapy Treatment Patient Details Name: Kristin NordmannSara O Dang MRN: [id number] DOB: 08/29/1917 Today's Date: 08/04/2015    History of Present Illness 82 yo F admitted with chest pain s/p fall the previous day. Dx: palpitations and chest pain, likely angina. She has aortic stenosis based on physical examination which could be contributing, also ECG suggestive of anterior ischemic changes and underlying CAD. No definitive ACS by troponin levesl which are 0.03 in flat pattern. PMH: HTN, lumbar disc disease, osteoporosis, GERD, anxiety, DVT, renal insufficiency, macular degeneration, cholecystectomy, R knee arthroscopy.    PT Comments    Pt received in bed, and was agreeable to PT tx.  Pt was able to ambulate a little further today - 6130ft with Min guard.  She continues to require Min A for bed mobility as well as sit<>stand transfers, therefore updated recommendations to include 24/7 supervision assistance as well as HHPT.    Follow Up Recommendations  Home health PT;Supervision/Assistance - 24 hour     Equipment Recommendations  None recommended by PT    Recommendations for Other Services       Precautions / Restrictions Precautions Precaution Comments: cardiac  Restrictions Weight Bearing Restrictions: No    Mobility  Bed Mobility Overal bed mobility: Needs Assistance Bed Mobility: Supine to Sit     Supine to sit: Min assist        Transfers Overall transfer level: Needs assistance Equipment used: 4-wheeled walker Transfers: Sit to/from Stand Sit to Stand: Min guard            Ambulation/Gait Ambulation/Gait assistance: Min guard Ambulation Distance (Feet): 30 Feet Assistive device: 4-wheeled walker Gait Pattern/deviations: Step-through pattern;Trunk flexed     General Gait Details: vc's to slow down. Distance limited by fatigue.    Stairs            Wheelchair Mobility    Modified Rankin (Stroke Patients Only)       Balance            Standing balance support: Bilateral upper extremity supported Standing balance-Leahy Scale: Fair                      Cognition Arousal/Alertness: Awake/alert Behavior During Therapy: WFL for tasks assessed/performed Overall Cognitive Status: Within Functional Limits for tasks assessed                      Exercises      General Comments        Pertinent Vitals/Pain Pain Assessment: No/denies pain    Home Living                      Prior Function            PT Goals (current goals can now be found in the care plan section) Acute Rehab PT Goals Patient Stated Goal: To be strong enough to go home.  PT Goal Formulation: With patient Time For Goal Achievement: 08/10/15 Potential to Achieve Goals: Good Progress towards PT goals: Progressing toward goals    Frequency  Min 3X/week    PT Plan Discharge plan needs to be updated    Co-evaluation             End of Session Equipment Utilized During Treatment: Gait belt Activity Tolerance: Patient limited by pain Patient left: in chair;with call bell/phone within reach     Time: [id number] PT Time Calculation (min) (ACUTE ONLY): 16 min  Charges:  $Gait  Training: 8-22 mins                    G Codes:      Beth Sevilla Murtagh, PT, DPT X: [phone]

## 2015-08-04 NOTE — Progress Notes (Addendum)
Primary Cardiologist: Dr. Jonelle Sidle  Subjective:  Complains of chest pain with little movement, "when I strain to get up". Goes away quickly.  Objective:  Vital Signs in the last 24 hours: Temp:  [97.2 F (36.2 C)-99 F (37.2 C)] 97.6 F (36.4 C) (07/19 0800) Pulse Rate:  [64-79] 68 (07/19 0400) Resp:  [13-20] 16 (07/19 0400) BP: (81-150)/(37-88) 117/44 mmHg (07/19 0400) SpO2:  [93 %-100 %] 96 % (07/19 0400)  Intake/Output from previous day: 07/18 0701 - 07/19 0700 In: 1149.3 [P.O.:960; I.V.:189.3] Out: 500 [Urine:500]  Physical Exam: NECK: Without JVD, HJR, or bruit LUNGS: Decreased breath sounds with crackles at bases HEART: Regular rate and rhythm, 3-4/6 harsh systolic murmur LSB,no gallop, rub, bruit, thrill, or heave EXTREMITIES: Without cyanosis, clubbing, or edema  Telemetry: Sinus rhythm with burst NSVT.  Lab Results:  Recent Labs  08/03/15 0426 08/04/15 0450  WBC 6.2 6.3  HGB 11.2* 10.6*  PLT 197 195    Recent Labs  08/02/15 1707  NA 135  K 4.1  CL 104  CO2 25  GLUCOSE 123*  BUN 24*  CREATININE 1.48*    Recent Labs  08/02/15 2147 08/03/15 0426  TROPONINI 0.03* 0.03*   Hepatic Function Panel  Recent Labs  08/02/15 1707  PROT 6.5  ALBUMIN 3.4*  AST 17  ALT 10*  ALKPHOS 69  BILITOT 0.5    Cardiac Studies:  Echocardiogram 08/04/15: Study Conclusions   - Left ventricle: The cavity size was normal. Wall thickness was   increased in a pattern of mild LVH. Systolic function was normal.   The estimated ejection fraction was in the range of 55% to 60%.   Wall motion was normal; there were no regional wall motion   abnormalities. Doppler parameters are consistent with abnormal   left ventricular relaxation (grade 1 diastolic dysfunction).   Doppler parameters are consistent with high ventricular filling   pressure. - Aortic valve: There was severe stenosis. Mean gradient (S): 34 mm   Hg. Peak gradient (S): 62 mm Hg.  VTI ratio of LVOT to aortic   valve: 0.28. Valve area (VTI): 0.79 cm^2. Valve area (Vmax): 0.79   cm^2. - Mitral valve: Calcified annulus. Mildly calcified leaflets .   There was trivial regurgitation. - Left atrium: The atrium was mildly dilated. - Right atrium: Central venous pressure (est): 8 mm Hg. - Tricuspid valve: There was physiologic regurgitation. - Pulmonary arteries: Systolic pressure could not be accurately   estimated. - Pericardium, extracardiac: There was no pericardial effusion.   Impressions:   - Mild LVH with LVEF 55-60%. Grade 1 diastolic dysfunction with   increased LV filling pressure. Mild left atrial enlargement. MAC   with mildly calcified mitral leaflets and trivial mitral   regurgitation. Severe calcific aortic stenosis as outlined above.    Assessment/Plan:   1. Unstable angina. She has severe aortic also ECG suggestive of ischemic changes and underlying CAD. No definitive ACS by troponin I levels which are 0.03 in flat pattern. Not an optimal candidate for invasive intervention, and she prefers conservative approach. Discussed with Dr. Ouida Sills and will treat medically. Transfer to floor and get up to see how she does today. D/C IV heparin. Could try adding low dose Imdur.  2. Long-standing history of palpitations as an outpatient. She has been relatively stable over time on combination of Toprol-XL and Cardizem CD. Rhythm is sinus at this time. Burst NSVT noted.  3. Essential hypertension, blood pressure is currently well  controlled.  4. CKD stage 3, creatinine 1.5.   LOS: 2 days   Nona DellSamuel Rolene Andrades 08/04/2015, 8:42 AM  Attending note:  Patient seen and examined. Discussed with Dr. Ouida SillsFagan. Agree with above assessment by Ms. Geni BersLenze PA-C. Ms. Vickki HearingYount says that she slept well. Still having intermittent but brief chest pain. Echocardiogram shows severe aortic stenosis which was suspected. She most likely is having symptoms related to CAD and AS. She prefers a  conservative treatment strategy and does not want to pursue invasive testing - I confirmed this with her again today. Reviewed BP and HR. Will try to reduce Altace and add low dose Imdur - need to avoid hypotension. Would transfer to telemetry today and observe. Home PT was recommended based on chart review. We will continue to follow.  Jonelle SidleSamuel G. Parris Cudworth, M.D., F.A.C.C.

## 2015-08-04 NOTE — Progress Notes (Signed)
Subjective: Complains of chest pressure when she sits up. She feels intermittent palpitations.  Objective: Vital signs in last 24 hours: Filed Vitals:   08/04/15 0100 08/04/15 0200 08/04/15 0300 08/04/15 0400  BP: 106/49 109/40 103/38 117/44  Pulse: 70 70 68 68  Temp:    98.7 F (37.1 C)  TempSrc:    Oral  Resp: 17 18 17 16   Height:      Weight:      SpO2: 97% 93% 93% 96%   Weight change:   Intake/Output Summary (Last 24 hours) at 08/04/15 0733 Last data filed at 08/04/15 0000  Gross per 24 hour  Intake 1149.26 ml  Output    500 ml  Net 649.26 ml    Physical Exam: Alert. No distress. Lungs clear. Heart regular with a grade 3 systolic murmur. Abdomen nontender with no hepatosplenomegaly. Extremities reveal no edema.  Lab Results:    Results for orders placed or performed during the hospital encounter of 08/02/15 (from the past 24 hour(s))  CBC     Status: Abnormal   Collection Time: 08/04/15  4:50 AM  Result Value Ref Range   WBC 6.3 4.0 - 10.5 K/uL   RBC 3.27 (L) 3.87 - 5.11 MIL/uL   Hemoglobin 10.6 (L) 12.0 - 15.0 g/dL   HCT 16.132.1 (L) 09.636.0 - 04.546.0 %   MCV 98.2 78.0 - 100.0 fL   MCH 32.4 26.0 - 34.0 pg   MCHC 33.0 30.0 - 36.0 g/dL   RDW 40.913.9 81.111.5 - 91.415.5 %   Platelets 195 150 - 400 K/uL  Heparin level (unfractionated)     Status: Abnormal   Collection Time: 08/04/15  4:50 AM  Result Value Ref Range   Heparin Unfractionated 0.21 (L) 0.30 - 0.70 IU/mL     ABGS No results for input(s): PHART, PO2ART, TCO2, HCO3 in the last 72 hours.  Invalid input(s): PCO2 CULTURES Recent Results (from the past 240 hour(s))  MRSA PCR Screening     Status: None   Collection Time: 08/02/15  4:00 PM  Result Value Ref Range Status   MRSA by PCR NEGATIVE NEGATIVE Final    Comment:        The GeneXpert MRSA Assay (FDA approved for NASAL specimens only), is one component of a comprehensive MRSA colonization surveillance program. It is not intended to diagnose MRSA infection  nor to guide or monitor treatment for MRSA infections.    Studies/Results: Portable Chest 1 View  08/02/2015  CLINICAL DATA:  Chest pain. EXAM: PORTABLE CHEST 1 VIEW COMPARISON:  05/14/2013, 12/12/2010 FINDINGS: Lung volumes are low. Mild cardiomegaly, similar to prior exam. Perihilar bronchial thickening appears chronic. There is increased right infrahilar atelectasis. Possible vascular congestion versus bronchovascular crowding. No large pleural effusion or pneumothorax. Remote right rib fractures. Chronic change about both shoulders. IMPRESSION: Low lung volumes. Stable cardiomegaly and chronic bronchial thickening. Vascular congestion versus bronchovascular crowding. Electronically Signed   By: Rubye OaksMelanie  Ehinger M.D.   On: 08/02/2015 18:11   Micro Results: Recent Results (from the past 240 hour(s))  MRSA PCR Screening     Status: None   Collection Time: 08/02/15  4:00 PM  Result Value Ref Range Status   MRSA by PCR NEGATIVE NEGATIVE Final    Comment:        The GeneXpert MRSA Assay (FDA approved for NASAL specimens only), is one component of a comprehensive MRSA colonization surveillance program. It is not intended to diagnose MRSA infection nor to guide or monitor treatment for  MRSA infections.    Studies/Results: Portable Chest 1 View  08/02/2015  CLINICAL DATA:  Chest pain. EXAM: PORTABLE CHEST 1 VIEW COMPARISON:  05/14/2013, 12/12/2010 FINDINGS: Lung volumes are low. Mild cardiomegaly, similar to prior exam. Perihilar bronchial thickening appears chronic. There is increased right infrahilar atelectasis. Possible vascular congestion versus bronchovascular crowding. No large pleural effusion or pneumothorax. Remote right rib fractures. Chronic change about both shoulders. IMPRESSION: Low lung volumes. Stable cardiomegaly and chronic bronchial thickening. Vascular congestion versus bronchovascular crowding. Electronically Signed   By: Rubye Oaks M.D.   On: 08/02/2015 18:11    Medications:  I have reviewed the patient's current medications Scheduled Meds: . aspirin EC  81 mg Oral Daily  . citalopram  10 mg Oral Daily  . diltiazem  120 mg Oral Daily  . feeding supplement (ENSURE ENLIVE)  237 mL Oral Q24H  . metoprolol succinate  100 mg Oral Daily  . multivitamin with minerals  1 tablet Oral Daily  . pantoprazole  40 mg Oral Daily  . ramipril  5 mg Oral Daily   Continuous Infusions: . sodium chloride 10 mL/hr (08/02/15 1727)  . heparin 700 Units/hr (08/04/15 0030)   PRN Meds:.nitroGLYCERIN, ondansetron **OR** ondansetron (ZOFRAN) IV   Assessment/Plan: #1. Chest pain. Echo reveals normal LV function with no wall motion abnormalities. We will likely discontinue heparin today. Will discuss further with cardiology.  EKG today reveals sinus rhythm with a left bundle branch block. She has previously had an intermittent left bundle branch block. #2. Aortic stenosis. Echo reveals severe aortic stenosis. Symptoms possibly related to this now. She is really not an operative candidate and does not wish to pursue intervention anyway. #3. Hypertension. Well controlled. Active Problems:   Chest pain     LOS: 2 days   Stephene Alegria 08/04/2015, 7:33 AM

## 2015-08-05 DIAGNOSIS — I200 Unstable angina: Secondary | ICD-10-CM | POA: Diagnosis not present

## 2015-08-05 DIAGNOSIS — N183 Chronic kidney disease, stage 3 (moderate): Secondary | ICD-10-CM | POA: Diagnosis not present

## 2015-08-05 DIAGNOSIS — I350 Nonrheumatic aortic (valve) stenosis: Secondary | ICD-10-CM | POA: Diagnosis not present

## 2015-08-05 DIAGNOSIS — I10 Essential (primary) hypertension: Secondary | ICD-10-CM | POA: Diagnosis not present

## 2015-08-05 LAB — CBC
HEMATOCRIT: 32.9 % — AB (ref 36.0–46.0)
HEMOGLOBIN: 10.5 g/dL — AB (ref 12.0–15.0)
MCH: 31.7 pg (ref 26.0–34.0)
MCHC: 31.9 g/dL (ref 30.0–36.0)
MCV: 99.4 fL (ref 78.0–100.0)
Platelets: 193 10*3/uL (ref 150–400)
RBC: 3.31 MIL/uL — ABNORMAL LOW (ref 3.87–5.11)
RDW: 13.9 % (ref 11.5–15.5)
WBC: 7 10*3/uL (ref 4.0–10.5)

## 2015-08-05 MED ORDER — ISOSORBIDE MONONITRATE ER 30 MG PO TB24: 15.0000 mg | ORAL_TABLET | Freq: Every day | ORAL | Status: AC

## 2015-08-05 MED ORDER — RAMIPRIL 2.5 MG PO CAPS: 2.5000 mg | ORAL_CAPSULE | Freq: Every day | ORAL | Status: DC

## 2015-08-05 MED ORDER — NITROGLYCERIN 0.4 MG SL SUBL
0.4000 mg | SUBLINGUAL_TABLET | SUBLINGUAL | Status: AC | PRN
Start: 2015-08-05 — End: ?

## 2015-08-05 NOTE — Progress Notes (Signed)
Primary cardiologist: Dr. Jonelle SidleSamuel G. Oluwateniola Leitch  Seen for followup: Unstable angina  Subjective:    Reports intermittent brief episodes of chest pain, otherwise stable. Fair appetite. Has worked with PT.  Objective:   Temp:  [98.5 F (36.9 C)-99.3 F (37.4 C)] 99.2 F (37.3 C) (07/20 0815) Pulse Rate:  [66-78] 72 (07/20 0600) Resp:  [14-30] 18 (07/20 0600) BP: (58-137)/(35-92) 100/38 mmHg (07/20 0600) SpO2:  [84 %-97 %] 92 % (07/20 0600) Weight:  [147 lb 4.3 oz (66.8 kg)] 147 lb 4.3 oz (66.8 kg) (07/20 0500) Last BM Date: 07/31/15  Filed Weights   08/02/15 1633 08/03/15 0500 08/05/15 0500  Weight: 136 lb 0.4 oz (61.7 kg) 145 lb 8.1 oz (66 kg) 147 lb 4.3 oz (66.8 kg)    Intake/Output Summary (Last 24 hours) at 08/05/15 [id number] Last data filed at 08/05/15 0815  Gross per 24 hour  Intake 1649.32 ml  Output    525 ml  Net 1124.32 ml    Telemetry: Sinus rhythm with rare PVC.  Exam:  General: Frail-appearing elderly woman, no distress.  Lungs: Clear with decreased breath sounds.  Cardiac: RRR with 3/6 systolic murmur consistent with AS.  Extremities: No pitting edema.  Lab Results:  Basic Metabolic Panel:  Recent Labs Lab 08/02/15 1707  NA 135  K 4.1  CL 104  CO2 25  GLUCOSE 123*  BUN 24*  CREATININE 1.48*  CALCIUM 8.6*    CBC:  Recent Labs Lab 08/03/15 0426 08/04/15 0450 08/05/15 0403  WBC 6.2 6.3 7.0  HGB 11.2* 10.6* 10.5*  HCT 34.9* 32.1* 32.9*  MCV 98.3 98.2 99.4  PLT 197 195 193    Cardiac Enzymes:  Recent Labs Lab 08/02/15 1707 08/02/15 2147 08/03/15 0426  TROPONINI 0.03* 0.03* 0.03*    Coagulation:  Recent Labs Lab 08/02/15 1707  INR 1.01   Echocardiogram 08/03/2015: Study Conclusions  - Left ventricle: The cavity size was normal. Wall thickness was  increased in a pattern of mild LVH. Systolic function was normal.  The estimated ejection fraction was in the range of 55% to 60%.  Wall motion was normal; there were  no regional wall motion  abnormalities. Doppler parameters are consistent with abnormal  left ventricular relaxation (grade 1 diastolic dysfunction).  Doppler parameters are consistent with high ventricular filling  pressure. - Aortic valve: There was severe stenosis. Mean gradient (S): 34 mm  Hg. Peak gradient (S): 62 mm Hg. VTI ratio of LVOT to aortic  valve: 0.28. Valve area (VTI): 0.79 cm^2. Valve area (Vmax): 0.79  cm^2. - Mitral valve: Calcified annulus. Mildly calcified leaflets .  There was trivial regurgitation. - Left atrium: The atrium was mildly dilated. - Right atrium: Central venous pressure (est): 8 mm Hg. - Tricuspid valve: There was physiologic regurgitation. - Pulmonary arteries: Systolic pressure could not be accurately  estimated. - Pericardium, extracardiac: There was no pericardial effusion.  Impressions:  - Mild LVH with LVEF 55-60%. Grade 1 diastolic dysfunction with  increased LV filling pressure. Mild left atrial enlargement. MAC  with mildly calcified mitral leaflets and trivial mitral  regurgitation. Severe calcific aortic stenosis as outlined above.   Medications:   Scheduled Medications: . aspirin EC  81 mg Oral Daily  . citalopram  10 mg Oral Daily  . diltiazem  120 mg Oral Daily  . feeding supplement (ENSURE ENLIVE)  237 mL Oral Q24H  . isosorbide mononitrate  15 mg Oral Daily  . metoprolol succinate  100 mg Oral Daily  .  multivitamin with minerals  1 tablet Oral Daily  . pantoprazole  40 mg Oral Daily  . ramipril  2.5 mg Oral Daily     Infusions: . sodium chloride 10 mL/hr (08/02/15 1727)     PRN Medications:  nitroGLYCERIN, ondansetron **OR** ondansetron (ZOFRAN) IV   Assessment:   1. Unstable angina, troponin I levels normal. Ischemic ECG changes consistent with underlying CAD although she also has evidence of severe aortic stenosis by echocardiogram. She prefers a conservative approach, declines further invasive  workup. Medical therapy is planned.  2. Long-standing history of palpitations. She continues on combination of Toprol-XL and Cardizem CD. Brief burst NSVT noted on telemetry. LVEF normal range.  3. Severe aortic stenosis. Patient prefers conservative management.  4. CKD, stage 3, creatinine 1.5.  5. Essential hypertension. Altace dose was reduced yesterday to allow room for addition of Imdur.   Plan/Discussion:    Discharge home planned today per Dr. Ouida Sills with home PT. Continue aspirin, Cardizem CD, Toprol-XL, Imdur, and Altace. We will make a follow-up visit for the next 2 weeks. Overall long-term prognosis is poor, if she has escalating symptoms, may need to be considered for a different living environment depending on anticipated assistance. She is DNR.   Jonelle Sidle, M.D., F.A.C.C.

## 2015-08-05 NOTE — Care Management Note (Signed)
Case Management Note  Patient Details  Name: Kristin Rubio MRN: [id number] Date of Birth: 09/18/1917  Subjective/Objective:       Spoke withpatietn who is alert from home with a daytime sitter, Patietn stated she is ready to go home and it is OK to talk with her daughter Kennith Centerracey for any issues,  Paitnet offered choice of HH providers and [facility]. Patietn has two rolling walkers but no Front wheeled walker which would benefit her at home due to width of her doors.  Pateitn ambulated 30 feet with PT yesterday and she stated that she feels at her baseline. [facility]err[facility] No other Cm needs identified.             Action/Plan: Home with Home Health.   Expected Discharge Date:                  Expected Discharge Plan:  Home w Home Health Services  In-House Referral:     Discharge planning Services     Post Acute Care Choice:    Choice offered to:  Patient  DME Arranged:  Walker rolling DME Agency:  [facility]  HH Arr[facility]  [facility]  Status of Service:  Completed, signed off  If discussed at Long Length of Stay Meetings, dates discussed:    Additional Comments:  Adonis HugueninBerkhead, Prentis Langdon L, RN 08/05/2015, 9:40 AM

## 2015-08-05 NOTE — Progress Notes (Signed)
Discharged to home. Caregiver came for transport and rolled all walkers to car which was two. All clothing was also taken.

## 2015-08-05 NOTE — Discharge Summary (Signed)
Physician Discharge Summary  Kristin NordmannSara O Stepney [id number] DOB: 12/05/1917 DOA: 08/02/2015   Admit date: 08/02/2015 Discharge date: 08/05/2015  Discharge Diagnoses: Severe aortic stenosis #2. Unstable angina. #3. Hypertension. #4. Chronic kidney disease stage IV. Active Problems:   Chest pain    Wt Readings from Last 3 Encounters:  08/05/15 147 lb 4.3 oz (66.8 kg)  06/16/15 152 lb (68.947 kg)  03/10/15 156 lb (70.761 kg)     Hospital Course:  This patient is a 82 year old female who was admitted with chest pain and palpitations. Her EKG revealed new anterior T-wave inversions suggestive of ischemia. She ruled out for an MI with stable troponins of 0.033. She felt intermittent brief pains and palpitations while hospitalized. Her echocardiogram revealed normal left ventricular function with no wall motion abnormalities. She has severe aortic stenosis and is not felt to be a candidate for surgical intervention. She was seen in cardiology consultation by Dr. Diona BrownerMcDowell. Imdur was added. She was prescribed sublingual nitroglycerin as needed. She will continue metoprolol, diltiazem and aspirin. Her Altace dose was reduced. Her condition at discharge is improved. She will be seen in follow-up in my office in one week.  Exam at diagnosis reveals clear lungs with a regular heart rhythm in the 70s with a grade 3 systolic murmur. Extremities reveal no edema she is alert and comfortable with an oxygen saturation of 94%.  She will be treated with home health physical therapy.   Discharge Instructions     Medication List    TAKE these medications        aspirin 325 MG EC tablet  Take 325 mg by mouth daily.     BONIVA IV  Inject into the vein every 3 (three) months.     citalopram 10 MG tablet  Commonly known as:  CELEXA  Take 10 mg by mouth daily.     diltiazem 120 MG 24 hr capsule  Commonly known as:  CARDIZEM CD  TAKE (1) CAPSULE BY MOUTH ONCE DAILY.     hydrochlorothiazide 12.5 MG  capsule  Commonly known as:  MICROZIDE  Take 12.5 mg by mouth daily.     ICAPS Caps  Take 1 capsule by mouth daily.     isosorbide mononitrate 30 MG 24 hr tablet  Commonly known as:  IMDUR  Take 0.5 tablets (15 mg total) by mouth daily.     metoprolol succinate 100 MG 24 hr tablet  Commonly known as:  TOPROL-XL  Take 100 mg by mouth daily.     nitroGLYCERIN 0.4 MG SL tablet  Commonly known as:  NITROSTAT  Place 1 tablet (0.4 mg total) under the tongue every 5 (five) minutes as needed for chest pain.     pantoprazole 40 MG tablet  Commonly known as:  PROTONIX  Take 40 mg by mouth daily.     ramipril 2.5 MG capsule  Commonly known as:  ALTACE  Take 1 capsule (2.5 mg total) by mouth daily.         [name] 08/05/2015

## 2015-08-19 ENCOUNTER — Ambulatory Visit (INDEPENDENT_AMBULATORY_CARE_PROVIDER_SITE_OTHER): Payer: Medicare Other | Admitting: Cardiology

## 2015-08-19 ENCOUNTER — Encounter: Payer: Self-pay | Admitting: Cardiology

## 2015-08-19 DIAGNOSIS — N189 Chronic kidney disease, unspecified: Secondary | ICD-10-CM

## 2015-08-19 DIAGNOSIS — I350 Nonrheumatic aortic (valve) stenosis: Secondary | ICD-10-CM

## 2015-08-19 DIAGNOSIS — N183 Chronic kidney disease, stage 3 unspecified: Secondary | ICD-10-CM

## 2015-08-19 DIAGNOSIS — I10 Essential (primary) hypertension: Secondary | ICD-10-CM

## 2015-08-19 DIAGNOSIS — R079 Chest pain, unspecified: Secondary | ICD-10-CM | POA: Diagnosis not present

## 2015-08-19 HISTORY — DX: Chronic kidney disease, stage 3 unspecified: N18.30

## 2015-08-19 HISTORY — DX: Nonrheumatic aortic (valve) stenosis: I35.0

## 2015-08-19 MED ORDER — ASPIRIN EC 81 MG PO TBEC: 81.0000 mg | DELAYED_RELEASE_TABLET | Freq: Every day | ORAL | 3 refills | Status: DC

## 2015-08-19 NOTE — Patient Instructions (Signed)
Your physician recommends that you schedule a follow-up appointment in:  6-8 Weeks with Dr. Diona Browner  Your physician has recommended you make the following change in your medication:  Take Losartan 25 mg at night  Decrease Aspirin to 81 mg Daily   If you need a refill on your cardiac medications before your next appointment, please call your pharmacy.  Thank you for choosing [facility]!

## 2015-08-19 NOTE — Assessment & Plan Note (Signed)
No further pain since Imdur added

## 2015-08-19 NOTE — Assessment & Plan Note (Signed)
B/P actually a little low today but she is asymptomatic

## 2015-08-19 NOTE — Assessment & Plan Note (Signed)
See echo 08/03/15- not a candidate for intervention

## 2015-08-19 NOTE — Progress Notes (Signed)
08/19/2015 Kristin Rubio   September 30, 1917  [id number]  Primary Physician Carylon Perches, MD Primary Cardiologist: Dr Diona Browner  HPI:  Pleasant 81 y/o female seen in the office today as a post hospital follow up. She was admitted with chest pain felt to be consistent with angina. She was discharged 08/05/15. Her medications were adjusted, Imdur added and Altace decreased in the hospital and apparently changed to Losartan 25 mg. She denies any chest pain. She denies syncope or near syncope.    Current Outpatient Prescriptions  Medication Sig Dispense Refill  . aspirin 325 MG EC tablet Take 325 mg by mouth daily.    . citalopram (CELEXA) 10 MG tablet Take 10 mg by mouth daily.    Marland Kitchen diltiazem (CARDIZEM CD) 120 MG 24 hr capsule     . hydrochlorothiazide (MICROZIDE) 12.5 MG capsule Take 12.5 mg by mouth daily.     . Ibandronate Sodium (BONIVA IV) Inject into the vein every 3 (three) months.     . isosorbide mononitrate (IMDUR) 30 MG 24 hr tablet Take 0.5 tablets (15 mg total) by mouth daily. 30 tablet 12  . metoprolol (TOPROL-XL) 100 MG 24 hr tablet Take 100 mg by mouth daily.      . Multiple Vitamins-Minerals (ICAPS) CAPS Take 1 capsule by mouth daily.      . nitroGLYCERIN (NITROSTAT) 0.4 MG SL tablet Place 1 tablet (0.4 mg total) under the tongue every 5 (five) minutes as needed for chest pain. 25 tablet 12  . pantoprazole (PROTONIX) 40 MG tablet Take 40 mg by mouth daily.      Marland Kitchen losartan (COZAAR) 25 MG tablet      No current facility-administered medications for this visit.     Allergies  Allergen Reactions  . Amoxicillin Rash    rash    Social History   Social History  . Marital status: Widowed    Spouse name: N/A  . Number of children: N/A  . Years of education: N/A   Occupational History  . Not on file.   Social History Main Topics  . Smoking status: Former Smoker    Packs/day: 0.10    Types: Cigarettes    Start date: 02/03/1931    Quit date: 02/03/1951  . Smokeless tobacco:  Never Used  . Alcohol use No  . Drug use: No  . Sexual activity: No   Other Topics Concern  . Not on file   Social History Narrative  . No narrative on file     Review of Systems: General: negative for chills, fever, night sweats or weight changes.  Cardiovascular: negative for chest pain, dyspnea on exertion, edema, orthopnea, palpitations, paroxysmal nocturnal dyspnea or shortness of breath Dermatological: negative for rash Respiratory: negative for cough or wheezing Urologic: negative for hematuria Abdominal: negative for nausea, vomiting, diarrhea, bright red blood per rectum, melena, or hematemesis Neurologic: negative for visual changes, syncope, or dizziness All other systems reviewed and are otherwise negative except as noted above.    Blood pressure (!) 108/54, pulse 73, height 5' (1.524 m), weight 150 lb (68 kg), SpO2 98 %.  General appearance: alert, cooperative, appears stated age and no distress Lungs: clear to auscultation bilaterally and kyphosis Heart: regular rate and rhythm and 2/6 systolic murmur, decreased S2 Extremities: trace edema Neurologic: Grossly normal   ASSESSMENT AND PLAN:   Chest pain with moderate risk of acute coronary syndrome No further pain since Imdur added  Essential hypertension, benign B/P actually a little low today but  she is asymptomatic  Severe aortic stenosis See echo 08/03/15- not a candidate for intervention  Chronic renal insufficiency, stage III (moderate) .  PLAN  Change Losartan to Q HS. Consider stopping this altogether if her B/P drops any lower. F/U Dr Diona Browner 6-8 weeks.   Corine Shelter PA-C 08/19/2015 1:33 PM

## 2015-09-14 NOTE — Progress Notes (Signed)
Cardiology Office Note  Date: 09/15/2015   ID: Kristin NordmannSara O Bortle, DOB 03/29/1917, MRN [id number]  PCP: Carylon PerchesFAGAN,ROY, MD  Primary Cardiologist: Nona DellSamuel McDowell, MD   Chief Complaint  Patient presents with  . Ischemic heart disease  . Aortic Stenosis    History of Present Illness: Kristin Rubio is a 82 y.o. female seen most recently as an inpatient consult at [facility] in July. She presented with unstable angina which was managed medically. Follow-up echocardiography showed severe aortic stenosis which had been suspected based on her examination. She preferred a conservative approach overall and this has not been aggressively pursued. She comes in today for a follow-up visit after seeing Mr. Lenn CalKilroy PA-C in early August.  She tells me that she actually feels reasonably well. She has not pushed herself beyond basic ADLs, still uses a walker. She did have a fall a few weeks ago with resolving ecchymosis on her right leg. She does not report any angina or nitroglycerin use. Blood pressure low normal today, she is asymptomatic, has not had any orthostatic dizziness.  Past Medical History:  Diagnosis Date  . Anxiety   . Essential hypertension, benign   . GERD (gastroesophageal reflux disease)   . History of DVT (deep vein thrombosis)   . Lumbar disc disease   . Macular degeneration   . Osteoporosis   . Palpitations   . Renal insufficiency   . Uterine prolapse     Current Outpatient Prescriptions  Medication Sig Dispense Refill  . aspirin EC 81 MG tablet Take 1 tablet (81 mg total) by mouth daily. 90 tablet 3  . citalopram (CELEXA) 10 MG tablet Take 10 mg by mouth daily.    Marland Kitchen. diltiazem (CARDIZEM CD) 120 MG 24 hr capsule     . hydrochlorothiazide (MICROZIDE) 12.5 MG capsule Take 12.5 mg by mouth daily.     . Ibandronate Sodium (BONIVA IV) Inject into the vein every 3 (three) months.     . isosorbide mononitrate (IMDUR) 30 MG 24 hr tablet Take 0.5 tablets (15 mg total) by mouth daily. 30  tablet 12  . losartan (COZAAR) 25 MG tablet Take 25 mg by mouth at bedtime.    . metoprolol (TOPROL-XL) 100 MG 24 hr tablet Take 100 mg by mouth daily.      . Multiple Vitamins-Minerals (ICAPS) CAPS Take 1 capsule by mouth daily.      . nitroGLYCERIN (NITROSTAT) 0.4 MG SL tablet Place 1 tablet (0.4 mg total) under the tongue every 5 (five) minutes as needed for chest pain. 25 tablet 12  . pantoprazole (PROTONIX) 40 MG tablet Take 40 mg by mouth daily.       No current facility-administered medications for this visit.    Allergies:  Amoxicillin   Social History: The patient  reports that she quit smoking about 64 years ago. Her smoking use included Cigarettes. She started smoking about 84 years ago. She smoked 0.10 packs per day. She has never used smokeless tobacco. She reports that she does not drink alcohol or use drugs.   ROS:  Please see the history of present illness. Otherwise, complete review of systems is positive for decreased hearing.  All other systems are reviewed and negative.   Physical Exam: VS:  BP 98/64   Pulse 74   Ht 5' (1.524 m)   Wt 151 lb (68.5 kg)   SpO2 91%   BMI 29.49 kg/m , BMI Body mass index is 29.49 kg/m.  Wt Readings from Last  3 Encounters:  09/15/15 151 lb (68.5 kg)  08/19/15 150 lb (68 kg)  08/05/15 147 lb 4.3 oz (66.8 kg)    General: Elderly woman, appears comfortable at rest. Has rolling walker. HEENT: Conjunctiva and lids normal, oropharynx clear. Neck: Supple, no elevated JVP or carotid bruits, no thyromegaly. Lungs: Clear to auscultation, nonlabored breathing at rest. Cardiac: Regular rate and rhythm, no S3, 3/6 systolic murmur, no pericardial rub. Abdomen: Soft, nontender, bowel sounds present. Extremities: Resolving ecchymosis right leg with small hematoma, distal pulses 2+.  ECG: I personally reviewed the tracing from 08/04/2015 which showed normal sinus rhythm with left bundle branch block.  Recent Labwork: 08/02/2015: ALT 10; AST 17;  BUN 24; Creatinine, Ser 1.48; Potassium 4.1; Sodium 135 08/05/2015: Hemoglobin 10.5; Platelets 193   Other Studies Reviewed Today:  Echocardiogram 08/03/2015: Study Conclusions  - Left ventricle: The cavity size was normal. Wall thickness was   increased in a pattern of mild LVH. Systolic function was normal.   The estimated ejection fraction was in the range of 55% to 60%.   Wall motion was normal; there were no regional wall motion   abnormalities. Doppler parameters are consistent with abnormal   left ventricular relaxation (grade 1 diastolic dysfunction).   Doppler parameters are consistent with high ventricular filling   pressure. - Aortic valve: There was severe stenosis. Mean gradient (S): 34 mm   Hg. Peak gradient (S): 62 mm Hg. VTI ratio of LVOT to aortic   valve: 0.28. Valve area (VTI): 0.79 cm^2. Valve area (Vmax): 0.79   cm^2. - Mitral valve: Calcified annulus. Mildly calcified leaflets .   There was trivial regurgitation. - Left atrium: The atrium was mildly dilated. - Right atrium: Central venous pressure (est): 8 mm Hg. - Tricuspid valve: There was physiologic regurgitation. - Pulmonary arteries: Systolic pressure could not be accurately   estimated. - Pericardium, extracardiac: There was no pericardial effusion.  Impressions:  - Mild LVH with LVEF 55-60%. Grade 1 diastolic dysfunction with   increased LV filling pressure. Mild left atrial enlargement. MAC   with mildly calcified mitral leaflets and trivial mitral   regurgitation. Severe calcific aortic stenosis as outlined above.  Assessment and Plan:  1. Ischemic heart disease with conservatively managed unstable angina back in July. She is doing reasonably well at this time on medical therapy. No changes were made today. She does develop any significant dizziness, may have to cut back on Cozaar dose.  2. Severe aortic stenosis, also to be managed conservatively. We have discussed this a number of  times.  3. Palpitations, no recent symptoms.  Current medicines were reviewed with the patient today.  Disposition: Follow-up with me in 3 months.  Signed, Jonelle Sidle, MD, [facility] 09/15/2015 11:16 AM    [facility] [address], [place], [place] [address] Phone: [phone]; Fax: [phone]

## 2015-09-15 ENCOUNTER — Ambulatory Visit (INDEPENDENT_AMBULATORY_CARE_PROVIDER_SITE_OTHER): Payer: Medicare Other | Admitting: Cardiology

## 2015-09-15 ENCOUNTER — Encounter: Payer: Self-pay | Admitting: Cardiology

## 2015-09-15 VITALS — BP 98/64 | HR 74 | Ht 60.0 in | Wt 151.0 lb

## 2015-09-15 DIAGNOSIS — I259 Chronic ischemic heart disease, unspecified: Secondary | ICD-10-CM

## 2015-09-15 DIAGNOSIS — R002 Palpitations: Secondary | ICD-10-CM

## 2015-09-15 DIAGNOSIS — I350 Nonrheumatic aortic (valve) stenosis: Secondary | ICD-10-CM | POA: Diagnosis not present

## 2015-09-15 NOTE — Patient Instructions (Signed)
Your physician recommends that you continue on your current medications as directed. Please refer to the Current Medication list given to you today.   Your physician recommends that you schedule a follow-up appointment in: 3 months with Dr. Diona BrownerMcDowell .    Thanks for choosing [facility]!!!

## 2015-10-14 ENCOUNTER — Encounter (HOSPITAL_COMMUNITY)
Admission: RE | Admit: 2015-10-14 | Discharge: 2015-10-14 | Disposition: A | Discharge status: Home / Self Care | Payer: Medicare Other | Source: Ambulatory Visit | Attending: Internal Medicine | Admitting: Internal Medicine

## 2015-10-14 DIAGNOSIS — M810 Age-related osteoporosis without current pathological fracture: Secondary | ICD-10-CM | POA: Diagnosis not present

## 2015-10-14 LAB — RENAL FUNCTION PANEL
Albumin: 3.4 g/dL — ABNORMAL LOW (ref 3.5–5.0)
Anion gap: 6 (ref 5–15)
BUN: 17 mg/dL (ref 6–20)
CHLORIDE: 101 mmol/L (ref 101–111)
CO2: 29 mmol/L (ref 22–32)
CREATININE: 1.38 mg/dL — AB (ref 0.44–1.00)
Calcium: 8.8 mg/dL — ABNORMAL LOW (ref 8.9–10.3)
GFR calc non Af Amer: 31 mL/min — ABNORMAL LOW (ref >60)
GFR, EST AFRICAN AMERICAN: 36 mL/min — AB (ref >60)
Glucose, Bld: 99 mg/dL (ref 65–99)
POTASSIUM: 4.2 mmol/L (ref 3.5–5.1)
Phosphorus: 3.5 mg/dL (ref 2.5–4.6)
Sodium: 136 mmol/L (ref 135–145)

## 2015-10-14 MED ORDER — IBANDRONATE SODIUM 3 MG/3ML IV SOLN
INTRAVENOUS | Status: AC
  Filled 2015-10-14: qty 3

## 2015-10-14 MED ORDER — IBANDRONATE SODIUM 3 MG/3ML IV SOLN
3.0000 mg | Freq: Once | INTRAVENOUS | Status: AC
  Administered 2015-10-14: 3 mg via INTRAVENOUS

## 2015-10-15 ENCOUNTER — Encounter: Payer: Self-pay | Admitting: Cardiology

## 2015-10-15 ENCOUNTER — Ambulatory Visit (INDEPENDENT_AMBULATORY_CARE_PROVIDER_SITE_OTHER): Payer: Medicare Other | Admitting: Cardiology

## 2015-10-15 VITALS — BP 116/62 | HR 69 | Ht 60.0 in | Wt 155.0 lb

## 2015-10-15 DIAGNOSIS — I259 Chronic ischemic heart disease, unspecified: Secondary | ICD-10-CM

## 2015-10-15 DIAGNOSIS — I350 Nonrheumatic aortic (valve) stenosis: Secondary | ICD-10-CM

## 2015-10-15 NOTE — Patient Instructions (Signed)
Your physician recommends that you schedule a follow-up appointment in: 3 months Dr McDowell    Your physician recommends that you continue on your current medications as directed. Please refer to the Current Medication list given to you today.     Thank you for choosing [facility] !         

## 2015-10-15 NOTE — Progress Notes (Signed)
Cardiology Office Note  Date: 10/15/2015   ID: Kristin NordmannSara O Yaeger, DOB 08/19/1917, MRN [id number]  PCP: Carylon PerchesFAGAN,ROY, MD  Primary Cardiologist: Nona DellSamuel Mattilyn Crites, MD   Chief Complaint  Patient presents with  . Aortic Stenosis    History of Present Illness: Kristin Rubio is a 82 y.o. female last seen in August. She is here today with a friend for a follow-up visit. States that she has not had any chest pain or palpitations. Very limited except for basic ADLs. She continues to use a rolling walker, denies any recent falls. I went over her medications which have not changed since last visit in August, and she seems to be tolerating them without any obvious side effects. She is not reporting any dizziness on standing.  Past Medical History:  Diagnosis Date  . Anxiety   . Essential hypertension, benign   . GERD (gastroesophageal reflux disease)   . History of DVT (deep vein thrombosis)   . Lumbar disc disease   . Macular degeneration   . Osteoporosis   . Palpitations   . Renal insufficiency   . Uterine prolapse     Current Outpatient Prescriptions  Medication Sig Dispense Refill  . aspirin EC 81 MG tablet Take 1 tablet (81 mg total) by mouth daily. 90 tablet 3  . citalopram (CELEXA) 10 MG tablet Take 10 mg by mouth daily.    Marland Kitchen. diltiazem (CARDIZEM CD) 120 MG 24 hr capsule     . hydrochlorothiazide (MICROZIDE) 12.5 MG capsule Take 12.5 mg by mouth daily.     . Ibandronate Sodium (BONIVA IV) Inject into the vein every 3 (three) months.     . isosorbide mononitrate (IMDUR) 30 MG 24 hr tablet Take 0.5 tablets (15 mg total) by mouth daily. 30 tablet 12  . losartan (COZAAR) 25 MG tablet Take 25 mg by mouth at bedtime.    . metoprolol (TOPROL-XL) 100 MG 24 hr tablet Take 100 mg by mouth daily.      . Multiple Vitamins-Minerals (ICAPS) CAPS Take 1 capsule by mouth daily.      . nitroGLYCERIN (NITROSTAT) 0.4 MG SL tablet Place 1 tablet (0.4 mg total) under the tongue every 5 (five) minutes as needed  for chest pain. 25 tablet 12  . pantoprazole (PROTONIX) 40 MG tablet Take 40 mg by mouth daily.       No current facility-administered medications for this visit.    Allergies:  Amoxicillin   Social History: The patient  reports that she quit smoking about 64 years ago. Her smoking use included Cigarettes. She started smoking about 84 years ago. She smoked 0.10 packs per day. She has never used smokeless tobacco. She reports that she does not drink alcohol or use drugs.   ROS:  Please see the history of present illness. Otherwise, complete review of systems is positive for decreased hearing, memory loss.  All other systems are reviewed and negative.   Physical Exam: VS:  BP 116/62   Pulse 69   Ht 5' (1.524 m)   Wt 155 lb (70.3 kg)   SpO2 95%   BMI 30.27 kg/m , BMI Body mass index is 30.27 kg/m.  Wt Readings from Last 3 Encounters:  10/15/15 155 lb (70.3 kg)  09/15/15 151 lb (68.5 kg)  08/19/15 150 lb (68 kg)    General: Elderly woman, appears comfortable at rest. Has rolling walker. HEENT: Conjunctiva and lids normal, oropharynx clear. Neck: Supple, no elevated JVP or carotid bruits, no thyromegaly. Lungs:  Clear to auscultation, nonlabored breathing at rest. Cardiac: Regular rate and rhythm, no S3, 3/6 systolic murmur, no pericardial rub. Abdomen: Soft, nontender, bowel sounds present. Extremities: Resolving ecchymosis right leg with small hematoma, distal pulses 2+.  ECG: I personally reviewed the tracing from 08/04/2015 which showed normal sinus rhythm with left bundle branch block.  Recent Labwork: 08/02/2015: ALT 10; AST 17 08/05/2015: Hemoglobin 10.5; Platelets 193 10/14/2015: BUN 17; Creatinine, Ser 1.38; Potassium 4.2; Sodium 136   Other Studies Reviewed Today:  Echocardiogram 08/03/2015: Study Conclusions  - Left ventricle: The cavity size was normal. Wall thickness was increased in a pattern of mild LVH. Systolic function was normal. The estimated ejection  fraction was in the range of 55% to 60%. Wall motion was normal; there were no regional wall motion abnormalities. Doppler parameters are consistent with abnormal left ventricular relaxation (grade 1 diastolic dysfunction). Doppler parameters are consistent with high ventricular filling pressure. - Aortic valve: There was severe stenosis. Mean gradient (S): 34 mm Hg. Peak gradient (S): 62 mm Hg. VTI ratio of LVOT to aortic valve: 0.28. Valve area (VTI): 0.79 cm^2. Valve area (Vmax): 0.79 cm^2. - Mitral valve: Calcified annulus. Mildly calcified leaflets . There was trivial regurgitation. - Left atrium: The atrium was mildly dilated. - Right atrium: Central venous pressure (est): 8 mm Hg. - Tricuspid valve: There was physiologic regurgitation. - Pulmonary arteries: Systolic pressure could not be accurately estimated. - Pericardium, extracardiac: There was no pericardial effusion.  Impressions:  - Mild LVH with LVEF 55-60%. Grade 1 diastolic dysfunction with increased LV filling pressure. Mild left atrial enlargement. MAC with mildly calcified mitral leaflets and trivial mitral regurgitation. Severe calcific aortic stenosis as outlined above.  Assessment and Plan:  1. Severe aortic stenosis, continuing with conservative management. She has declined further evaluation.  2. Ischemic heart disease status post presentation with unstable angina in July. Plan is to continue medical therapy without further invasive assessment. She is tolerating her current medications.  Current medicines were reviewed with the patient today.  Disposition: Follow-up with me in 3 months.  Signed, Jonelle Sidle, MD, [facility] 10/15/2015 2:07 PM    [facility] [address] [place], [place] [address] Phone: [phone]; Fax: [phone]

## 2015-10-19 NOTE — Progress Notes (Signed)
Results for COY, VANDOREN (MRN [id number]) as of 10/19/2015 09:10  Ref. Range 10/14/2015 13:19  Sodium Latest Ref Range: 135 - 145 mmol/L 136  Potassium Latest Ref Range: 3.5 - 5.1 mmol/L 4.2  Chloride Latest Ref Range: 101 - 111 mmol/L 101  CO2 Latest Ref Range: 22 - 32 mmol/L 29  BUN Latest Ref Range: 6 - 20 mg/dL 17  Creatinine Latest Ref Range: 0.44 - 1.00 mg/dL 1.38 (H)  Calcium Latest Ref Range: 8.9 - 10.3 mg/dL 8.8 (L)  EGFR (Non-African Amer.) Latest Ref Range: >60 mL/min 31 (L)  EGFR (African American) Latest Ref Range: >60 mL/min 36 (L)  Glucose Latest Ref Range: 65 - 99 mg/dL 99  Anion gap Latest Ref Range: 5 - 15  6  Phosphorus Latest Ref Range: 2.5 - 4.6 mg/dL 3.5  Albumin Latest Ref Range: 3.5 - 5.0 g/dL 3.4 (L)

## 2015-12-16 ENCOUNTER — Ambulatory Visit: Payer: Medicare Other | Admitting: Cardiology

## 2016-01-09 ENCOUNTER — Emergency Department (HOSPITAL_COMMUNITY)
Admission: EM | Admit: 2016-01-09 | Discharge: 2016-01-09 | Disposition: A | Discharge status: Home / Self Care | Payer: Medicare Other | Attending: Emergency Medicine | Admitting: Emergency Medicine

## 2016-01-09 ENCOUNTER — Encounter (HOSPITAL_COMMUNITY): Payer: Self-pay

## 2016-01-09 ENCOUNTER — Emergency Department (HOSPITAL_COMMUNITY): Payer: Medicare Other

## 2016-01-09 DIAGNOSIS — Y9389 Activity, other specified: Secondary | ICD-10-CM | POA: Insufficient documentation

## 2016-01-09 DIAGNOSIS — Y929 Unspecified place or not applicable: Secondary | ICD-10-CM | POA: Diagnosis not present

## 2016-01-09 DIAGNOSIS — N183 Chronic kidney disease, stage 3 (moderate): Secondary | ICD-10-CM | POA: Diagnosis not present

## 2016-01-09 DIAGNOSIS — W1809XA Striking against other object with subsequent fall, initial encounter: Secondary | ICD-10-CM | POA: Insufficient documentation

## 2016-01-09 DIAGNOSIS — Z87891 Personal history of nicotine dependence: Secondary | ICD-10-CM | POA: Insufficient documentation

## 2016-01-09 DIAGNOSIS — I129 Hypertensive chronic kidney disease with stage 1 through stage 4 chronic kidney disease, or unspecified chronic kidney disease: Secondary | ICD-10-CM | POA: Insufficient documentation

## 2016-01-09 DIAGNOSIS — S0101XA Laceration without foreign body of scalp, initial encounter: Secondary | ICD-10-CM

## 2016-01-09 DIAGNOSIS — W19XXXA Unspecified fall, initial encounter: Secondary | ICD-10-CM

## 2016-01-09 DIAGNOSIS — Y92009 Unspecified place in unspecified non-institutional (private) residence as the place of occurrence of the external cause: Secondary | ICD-10-CM

## 2016-01-09 DIAGNOSIS — S0990XA Unspecified injury of head, initial encounter: Secondary | ICD-10-CM | POA: Diagnosis present

## 2016-01-09 DIAGNOSIS — Y999 Unspecified external cause status: Secondary | ICD-10-CM | POA: Diagnosis not present

## 2016-01-09 MED ORDER — BUPIVACAINE HCL (PF) 0.5 % IJ SOLN
10.0000 mL | Freq: Once | INTRAMUSCULAR | Status: DC
  Filled 2016-01-09: qty 30

## 2016-01-09 NOTE — ED Triage Notes (Signed)
EMS called out for fall. Per pts she was using her walker and she fell and hit her head on the door frame. Laceration to right head. EMS states bleeding was controlled when they arrived, dressing in place from EMS. Patient is alert and oriented times four.

## 2016-01-09 NOTE — ED Provider Notes (Addendum)
[facility]-EMERGENCY DEPT Provider Note   By signing my name below, I, Kristin Rubio, attest that this documentation has been prepared under the direction and in the presence of Kristin AlbeIva Julianah Marciel, MD. Electronically Signed: Earmon PhoenixJennifer Rubio, ED Scribe. 01/09/16. 12:55 AM.  Time seen 12:44 AM  History   Chief Complaint Chief Complaint  Patient presents with  . Fall    The history is provided by the patient, a relative and medical records. No language interpreter was used.    HPI Comments:  Kristin Rubio is a 82 y.o. female brought in by EMS, who presents to the Emergency Department complaining of an unwitnessed, mechanical fall that occurred while she was getting ready for bed about 1.5 hours ago. She reports a wound to the right parietal scalp with controlled bleeding. She has not taken anything for pain. She denies modifying factors. She denies nausea, vomiting, LOC, visual changes, numbness, tingling or weakness of any extremity, neck pain, extremity pain, rib pain, abdominal pain. PCP is Dr. Ouida SillsFagan. She states she is ambulatory with a walker and was using it at the time of the fall. She states she thinks she lost her balance when she turned while going into her bedroom.  She states she lives alone and used her Life Line to call for help. She denies anticoagulant therapy. She states her last tetanus vaccination was within the last 10 years.   PCP Carylon PerchesFAGAN,ROY, MD   Past Medical History:  Diagnosis Date  . Anxiety   . Essential hypertension, benign   . GERD (gastroesophageal reflux disease)   . History of DVT (deep vein thrombosis)   . Lumbar disc disease   . Macular degeneration   . Osteoporosis   . Palpitations   . Renal insufficiency   . Uterine prolapse     Patient Active Problem List   Diagnosis Date Noted  . Severe aortic stenosis 08/19/2015  . Chronic renal insufficiency, stage III (moderate) 08/19/2015  . Chest pain with moderate risk of acute coronary syndrome 08/02/2015  .  Cardiac murmur 02/02/2014  . Osteoporosis 09/29/2011  . Essential hypertension, benign 11/15/2009  . Palpitations 11/15/2009    Past Surgical History:  Procedure Laterality Date  . CERVICAL POLYPECTOMY     Excision of interest cervical/ endometrial lesion  . CHOLECYSTECTOMY    . FINGER GANGLION CYST EXCISION     Mucoid cyst, left middle finger  . KNEE ARTHROSCOPY     Right  . VITRECTOMY     Partial, left eye    OB History    No data available       Home Medications    Prior to Admission medications   Medication Sig Start Date End Date Taking? Authorizing Provider  aspirin EC 81 MG tablet Take 1 tablet (81 mg total) by mouth daily. 08/19/15   Abelino DerrickLuke K Kilroy, PA-C  citalopram (CELEXA) 10 MG tablet Take 10 mg by mouth daily.    Historical Provider, MD  diltiazem (CARDIZEM CD) 120 MG 24 hr capsule  08/17/15   Historical Provider, MD  hydrochlorothiazide (MICROZIDE) 12.5 MG capsule Take 12.5 mg by mouth daily.     Historical Provider, MD  Ibandronate Sodium (BONIVA IV) Inject into the vein every 3 (three) months.     Historical Provider, MD  isosorbide mononitrate (IMDUR) 30 MG 24 hr tablet Take 0.5 tablets (15 mg total) by mouth daily. 08/05/15   Carylon Perchesoy Fagan, MD  losartan (COZAAR) 25 MG tablet Take 25 mg by mouth at bedtime. 08/10/15  Historical Provider, MD  metoprolol (TOPROL-XL) 100 MG 24 hr tablet Take 100 mg by mouth daily.      Historical Provider, MD  Multiple Vitamins-Minerals (ICAPS) CAPS Take 1 capsule by mouth daily.      Historical Provider, MD  nitroGLYCERIN (NITROSTAT) 0.4 MG SL tablet Place 1 tablet (0.4 mg total) under the tongue every 5 (five) minutes as needed for chest pain. 08/05/15   Carylon Perchesoy Fagan, MD  pantoprazole (PROTONIX) 40 MG tablet Take 40 mg by mouth daily.      Historical Provider, MD    Family History Family History  Problem Relation Age of Onset  . Cancer Paternal Aunt   . Stroke Paternal Uncle     Social History Social History  Substance Use Topics    . Smoking status: Former Smoker    Packs/day: 0.10    Types: Cigarettes    Start date: 02/03/1931    Quit date: 02/03/1951  . Smokeless tobacco: Never Used  . Alcohol use No  lives at home Lives alone Uses a walker  Allergies   Amoxicillin   Review of Systems Review of Systems  Eyes: Negative for visual disturbance.  Gastrointestinal: Negative for abdominal pain, nausea and vomiting.  Musculoskeletal: Negative for arthralgias, back pain and neck pain.  Skin: Positive for wound.  Neurological: Negative for syncope, weakness and numbness.  All other systems reviewed and are negative.    Physical Exam Updated Vital Signs BP 128/74 (BP Location: Left Arm)   Pulse 73   Temp 98.3 F (36.8 C) (Oral)   Resp 16   Ht 5' (1.524 m)   Wt 160 lb (72.6 kg)   SpO2 100%   BMI 31.25 kg/m   Vital signs normal    Physical Exam  Constitutional: She is oriented to person, place, and time. She appears well-developed and well-nourished.  Non-toxic appearance. She does not appear ill. No distress.  Pleasant elderly lady.  HENT:  Head: Normocephalic.  Right Ear: External ear normal.  Left Ear: External ear normal.  Nose: Nose normal. No mucosal edema or rhinorrhea.  Mouth/Throat: Oropharynx is clear and moist and mucous membranes are normal. No dental abscesses or uvula swelling.  1.5 cm laceration to right scalp.  Eyes: Conjunctivae and EOM are normal. Pupils are equal, round, and reactive to light.  Neck: Normal range of motion and full passive range of motion without pain. Neck supple.  Cardiovascular: Normal rate, regular rhythm and normal heart sounds.  Exam reveals no gallop and no friction rub.   No murmur heard. Pulmonary/Chest: Effort normal and breath sounds normal. No respiratory distress. She has no wheezes. She has no rhonchi. She has no rales. She exhibits no tenderness and no crepitus.  Abdominal: Soft. Normal appearance and bowel sounds are normal. She exhibits no  distension. There is no tenderness. There is no rebound and no guarding.  Musculoskeletal: Normal range of motion. She exhibits no edema or tenderness.  Moves all extremities well.   Neurological: She is alert and oriented to person, place, and time. She has normal strength. No cranial nerve deficit.  Skin: Skin is warm, dry and intact. No rash noted. No erythema. No pallor.  Psychiatric: She has a normal mood and affect. Her speech is normal and behavior is normal. Her mood appears not anxious.  Nursing note and vitals reviewed.    ED Treatments / Results  DIAGNOSTIC STUDIES: Oxygen Saturation is 100% on RA, normal by my interpretation.      Labs (all  labs ordered are listed, but only abnormal results are displayed) Labs Reviewed - No data to display  EKG  EKG Interpretation None       Radiology Ct Head Wo Contrast  Result Date: 01/09/2016 CLINICAL DATA:  Larey Seat while getting ready for bed 1.5 hours prior to arrival. EXAM: CT HEAD WITHOUT CONTRAST TECHNIQUE: Contiguous axial images were obtained from the base of the skull through the vertex without intravenous contrast. COMPARISON:  05/14/2013 FINDINGS: Brain: There is no intracranial hemorrhage, mass or evidence of acute infarction. There is moderate generalized atrophy. There is moderate chronic microvascular ischemic change. There is no significant extra-axial fluid collection. No acute intracranial findings are evident. Vascular: No hyperdense vessel or unexpected calcification. Skull: Normal. Negative for fracture or focal lesion. Sinuses/Orbits: No acute finding. Other: Right posterior parietal scalp laceration. IMPRESSION: No acute intracranial findings. There is moderate generalized atrophy and chronic appearing white matter hypodensities which likely represent small vessel ischemic disease. Electronically Signed   By: Ellery Plunk M.D.   On: 01/09/2016 01:45    Procedures Procedures (including critical care  time)  LACERATION REPAIR Performed by: Ward Givens Authorized by: Ward Givens Consent: Verbal consent obtained. Risks and benefits: risks, benefits and alternatives were discussed Consent given by: patient Patient identity confirmed: provided demographic data Prepped and Draped in normal sterile fashion Wound explored  Laceration Location: right scalp   Laceration Length: 1.5 cm  No Foreign Bodies seen or palpated  Anesthesia: local infiltration  Local anesthetic: 0.5 % marcaine  Anesthetic total: 4 ml   Amount of cleaning: standard  Skin closure: staples  Number of staples: 3    Patient tolerance: Patient tolerated the procedure well with no immediate complications.   Medications Ordered in ED Medications  bupivacaine (MARCAINE) 0.5 % injection 10 mL (not administered)     Initial Impression / Assessment and Plan / ED Course  I have reviewed the triage vital signs and the nursing notes.  Pertinent labs & imaging results that were available during my care of the patient were reviewed by me and considered in my medical decision making (see chart for details).  Clinical Course      COORDINATION OF CARE: 12:51 AM- Will CT head and then suture head laceration. Pt verbalizes understanding and agrees to plan.  Patient was given aftercare instructions for staples. She was advised to have someone check on her frequently and to return to the ED for any problems, head injury sheet. The staples should be removed in one week.  Final Clinical Impressions(s) / ED Diagnoses   Final diagnoses:  Fall at home, initial encounter  Laceration of occipital region of scalp, initial encounter    Plan discharge  Kristin Albe, MD, Concha Pyo, MD 01/09/16 1610    Kristin Albe, MD 01/09/16 [id number]

## 2016-01-09 NOTE — Discharge Instructions (Signed)
You can wash your hair to get the blood out of it, then try to keep the laceration clean and dry. You can use triple antibiotic ointment on the wound. The staples need to be removed in 1 week. Return to the ED for any problems listed on the head injury sheet.

## 2016-01-13 ENCOUNTER — Encounter (HOSPITAL_COMMUNITY)
Admission: RE | Admit: 2016-01-13 | Discharge: 2016-01-13 | Disposition: A | Discharge status: Home / Self Care | Payer: Medicare Other | Source: Ambulatory Visit | Attending: Internal Medicine | Admitting: Internal Medicine

## 2016-01-13 DIAGNOSIS — M810 Age-related osteoporosis without current pathological fracture: Secondary | ICD-10-CM | POA: Diagnosis not present

## 2016-01-13 LAB — RENAL FUNCTION PANEL
ALBUMIN: 3.3 g/dL — AB (ref 3.5–5.0)
Anion gap: 7 (ref 5–15)
BUN: 19 mg/dL (ref 6–20)
CALCIUM: 8.6 mg/dL — AB (ref 8.9–10.3)
CO2: 29 mmol/L (ref 22–32)
Chloride: 101 mmol/L (ref 101–111)
Creatinine, Ser: 1.48 mg/dL — ABNORMAL HIGH (ref 0.44–1.00)
GFR calc Af Amer: 33 mL/min — ABNORMAL LOW (ref >60)
GFR calc non Af Amer: 29 mL/min — ABNORMAL LOW (ref >60)
Glucose, Bld: 95 mg/dL (ref 65–99)
PHOSPHORUS: 3.7 mg/dL (ref 2.5–4.6)
Potassium: 3.9 mmol/L (ref 3.5–5.1)
SODIUM: 137 mmol/L (ref 135–145)

## 2016-01-13 MED ORDER — IBANDRONATE SODIUM 3 MG/3ML IV SOLN
3.0000 mg | Freq: Once | INTRAVENOUS | Status: AC
  Administered 2016-01-13: 3 mg via INTRAVENOUS

## 2016-01-13 MED ORDER — IBANDRONATE SODIUM 3 MG/3ML IV SOLN
INTRAVENOUS | Status: AC
  Filled 2016-01-13: qty 3

## 2016-01-14 NOTE — Progress Notes (Signed)
Results for [name], Kristin Rubio (MRN [id number]) as of 01/14/2016 07:06  Ref. Range 01/13/2016 13:18  Sodium Latest Ref Range: 135 - 145 mmol/L 137  Potassium Latest Ref Range: 3.5 - 5.1 mmol/L 3.9  Chloride Latest Ref Range: 101 - 111 mmol/L 101  CO2 Latest Ref Range: 22 - 32 mmol/L 29  BUN Latest Ref Range: 6 - 20 mg/dL 19  Creatinine Latest Ref Range: 0.44 - 1.00 mg/dL 1.48 (H)  Calcium Latest Ref Range: 8.9 - 10.3 mg/dL 8.6 (L)  EGFR (Non-African Amer.) Latest Ref Range: >60 mL/min 29 (L)  EGFR (African American) Latest Ref Range: >60 mL/min 33 (L)  Glucose Latest Ref Range: 65 - 99 mg/dL 95  Anion gap Latest Ref Range: 5 - 15  7  Phosphorus Latest Ref Range: 2.5 - 4.6 mg/dL 3.7  Albumin Latest Ref Range: 3.5 - 5.0 g/dL 3.3 (L)

## 2016-01-15 ENCOUNTER — Other Ambulatory Visit: Payer: Self-pay | Admitting: Cardiology

## 2016-01-21 ENCOUNTER — Ambulatory Visit (INDEPENDENT_AMBULATORY_CARE_PROVIDER_SITE_OTHER): Payer: Medicare Other | Admitting: Cardiology

## 2016-01-21 ENCOUNTER — Encounter: Payer: Self-pay | Admitting: Cardiology

## 2016-01-21 VITALS — BP 116/56 | HR 56 | Ht 60.0 in | Wt 143.0 lb

## 2016-01-21 DIAGNOSIS — R002 Palpitations: Secondary | ICD-10-CM | POA: Diagnosis not present

## 2016-01-21 DIAGNOSIS — I259 Chronic ischemic heart disease, unspecified: Secondary | ICD-10-CM

## 2016-01-21 DIAGNOSIS — I350 Nonrheumatic aortic (valve) stenosis: Secondary | ICD-10-CM | POA: Diagnosis not present

## 2016-01-21 NOTE — Patient Instructions (Signed)
Your physician wants you to follow-up in:  4 months Dr McDowell You will receive a reminder letter in the mail two months in advance. If you don't receive a letter, please call our office to schedule the follow-up appointment.    Your physician recommends that you continue on your current medications as directed. Please refer to the Current Medication list given to you today.      Thank you for choosing [facility] !        

## 2016-01-21 NOTE — Progress Notes (Signed)
Cardiology Office Note  Date: 01/21/2016   ID: IMYA MANCE, DOB 1917-08-08, MRN [id number]  PCP: Asencion Noble, MD  Primary Cardiologist: Rozann Lesches, MD   Chief Complaint  Patient presents with  . Aortic Stenosis    History of Present Illness: Kristin Rubio is a 83 y.o. female last seen in September 2017. She presents today with a friend for follow-up. She does not report any major change in stamina, functional limitations at baseline continue. She uses a rolling walker. Does tell me that she was able to visit with family in [place] over the holidays. She has had no angina or syncope.  She did have a recent fall at home in December 2017, seen in the ER with head laceration. Head CT did not demonstrate any acute intracranial findings. Had staples taken out by Dr. Willey Blade earlier this week.  I reviewed her cardiac medications which are stable and outlined below.  Past Medical History:  Diagnosis Date  . Anxiety   . Essential hypertension, benign   . GERD (gastroesophageal reflux disease)   . History of DVT (deep vein thrombosis)   . Lumbar disc disease   . Macular degeneration   . Osteoporosis   . Palpitations   . Renal insufficiency   . Uterine prolapse     Current Outpatient Prescriptions  Medication Sig Dispense Refill  . aspirin EC 81 MG tablet Take 1 tablet (81 mg total) by mouth daily. 90 tablet 3  . citalopram (CELEXA) 10 MG tablet Take 10 mg by mouth daily.    Marland Kitchen diltiazem (CARDIZEM CD) 120 MG 24 hr capsule TAKE (1) CAPSULE BY MOUTH ONCE DAILY. 30 capsule 3  . hydrochlorothiazide (MICROZIDE) 12.5 MG capsule Take 12.5 mg by mouth daily.     . Ibandronate Sodium (BONIVA IV) Inject into the vein every 3 (three) months.     . isosorbide mononitrate (IMDUR) 30 MG 24 hr tablet Take 0.5 tablets (15 mg total) by mouth daily. 30 tablet 12  . losartan (COZAAR) 25 MG tablet Take 25 mg by mouth at bedtime.    . metoprolol (TOPROL-XL) 100 MG 24 hr tablet Take 100 mg by mouth  daily.      . Multiple Vitamins-Minerals (ICAPS) CAPS Take 1 capsule by mouth daily.      . nitroGLYCERIN (NITROSTAT) 0.4 MG SL tablet Place 1 tablet (0.4 mg total) under the tongue every 5 (five) minutes as needed for chest pain. 25 tablet 12  . pantoprazole (PROTONIX) 40 MG tablet Take 40 mg by mouth daily.       No current facility-administered medications for this visit.    Allergies:  Amoxicillin   Social History: The patient  reports that she quit smoking about 65 years ago. Her smoking use included Cigarettes. She started smoking about 85 years ago. She smoked 0.10 packs per day. She has never used smokeless tobacco. She reports that she does not drink alcohol or use drugs.   ROS:  Please see the history of present illness. Otherwise, complete review of systems is positive for hearing loss.  All other systems are reviewed and negative.   Physical Exam: VS:  BP (!) 116/56   Pulse (!) 56   Ht 5' (1.524 m)   Wt 143 lb (64.9 kg)   SpO2 98%   BMI 27.93 kg/m , BMI Body mass index is 27.93 kg/m.  Wt Readings from Last 3 Encounters:  01/21/16 143 lb (64.9 kg)  01/09/16 160 lb (72.6 kg)  10/15/15  155 lb (70.3 kg)    General: Elderly woman,appears comfortable at rest. Has rolling walker. HEENT: Conjunctiva and lids normal, oropharynx clear. Neck: Supple, no elevated JVP or carotid bruits, no thyromegaly. Lungs: Clear to auscultation, nonlabored breathing at rest. Cardiac: Regular rate and rhythm, no S3, 3/6systolic murmur, no pericardial rub. Abdomen: Soft, nontender, bowel sounds present. Extremities: Resolving ecchymosis right leg with small hematoma, distal pulses 2+.  ECG: I personally reviewed the tracing from 08/04/2015 which showed normal sinus rhythm with left bundle branch block.  Recent Labwork: 08/02/2015: ALT 10; AST 17 08/05/2015: Hemoglobin 10.5; Platelets 193 01/13/2016: BUN 19; Creatinine, Ser 1.48; Potassium 3.9; Sodium 137  No results found for: CHOL, TRIG,  HDL, CHOLHDL, VLDL, LDLCALC, LDLDIRECT  Other Studies Reviewed Today:  Echocardiogram 08/03/2015: Study Conclusions  - Left ventricle: The cavity size was normal. Wall thickness was increased in a pattern of mild LVH. Systolic function was normal. The estimated ejection fraction was in the range of 55% to 60%. Wall motion was normal; there were no regional wall motion abnormalities. Doppler parameters are consistent with abnormal left ventricular relaxation (grade 1 diastolic dysfunction). Doppler parameters are consistent with high ventricular filling pressure. - Aortic valve: There was severe stenosis. Mean gradient (S): 34 mm Hg. Peak gradient (S): 62 mm Hg. VTI ratio of LVOT to aortic valve: 0.28. Valve area (VTI): 0.79 cm^2. Valve area (Vmax): 0.79 cm^2. - Mitral valve: Calcified annulus. Mildly calcified leaflets . There was trivial regurgitation. - Left atrium: The atrium was mildly dilated. - Right atrium: Central venous pressure (est): 8 mm Hg. - Tricuspid valve: There was physiologic regurgitation. - Pulmonary arteries: Systolic pressure could not be accurately estimated. - Pericardium, extracardiac: There was no pericardial effusion.  Impressions:  - Mild LVH with LVEF 55-60%. Grade 1 diastolic dysfunction with increased LV filling pressure. Mild left atrial enlargement. MAC with mildly calcified mitral leaflets and trivial mitral regurgitation. Severe calcific aortic stenosis as outlined above.  Assessment and Plan:  1. Conservatively managed severe aortic stenosis. Echocardiogram from July 2017 as outlined above.  2. Ischemic heart disease, currently stable on medical therapy without active angina symptoms.  3. History of palpitations, no progression recently.  Current medicines were reviewed with the patient today.  Disposition: Follow-up in 4 months.  Signed, Kristin Sark, MD, [facility] 01/21/2016 1:17 PM    [facility] at [facility] [address] [place], [place] [address] Phone: [phone]; Fax: [phone]

## 2016-04-12 ENCOUNTER — Encounter (HOSPITAL_COMMUNITY)
Admission: RE | Admit: 2016-04-12 | Discharge: 2016-04-12 | Disposition: A | Discharge status: Home / Self Care | Payer: Medicare Other | Source: Ambulatory Visit | Attending: Internal Medicine | Admitting: Internal Medicine

## 2016-04-12 DIAGNOSIS — M810 Age-related osteoporosis without current pathological fracture: Secondary | ICD-10-CM | POA: Diagnosis present

## 2016-04-12 MED ORDER — IBANDRONATE SODIUM 3 MG/3ML IV SOLN: 3.0000 mg | Freq: Once | INTRAVENOUS | Status: DC

## 2016-04-12 MED ORDER — DENOSUMAB 60 MG/ML ~~LOC~~ SOLN
60.0000 mg | Freq: Once | SUBCUTANEOUS | Status: AC
  Administered 2016-04-12: 60 mg via SUBCUTANEOUS
  Filled 2016-04-12: qty 1

## 2016-04-12 MED ORDER — IBANDRONATE SODIUM 3 MG/3ML IV SOLN
INTRAVENOUS | Status: AC
  Filled 2016-04-12: qty 3

## 2016-04-15 ENCOUNTER — Other Ambulatory Visit: Payer: Self-pay | Admitting: Cardiology

## 2016-05-23 NOTE — Progress Notes (Signed)
Cardiology Office Note  Date: 05/24/2016   ID: RESA RINKS, DOB 07-08-1917, MRN [id number]  PCP: Asencion Noble, MD  Primary Cardiologist: Rozann Lesches, MD   Chief Complaint  Patient presents with  . Aortic Stenosis  . Ischemic heart disease    History of Present Illness: Kristin Rubio is a 83 y.o. female last seen in January. She presents today with an Environmental consultant for follow-up. She recently celebrated her 89th birthday, tells me that she had a quiet day at home. Fortunately, she does not report any worsening symptoms, specifically no angina, increasing dyspnea, palpitations, or syncope. She uses a rolling walker, has not had any recent falls.  We continue to manage her medically for ischemic heart disease and severe aortic stenosis. Current cardiac medications are outlined below and stable. Blood pressure and heart rate are well controlled.  Past Medical History:  Diagnosis Date  . Anxiety   . Essential hypertension, benign   . GERD (gastroesophageal reflux disease)   . History of DVT (deep vein thrombosis)   . Lumbar disc disease   . Macular degeneration   . Osteoporosis   . Palpitations   . Renal insufficiency   . Uterine prolapse     Past Surgical History:  Procedure Laterality Date  . CERVICAL POLYPECTOMY     Excision of interest cervical/ endometrial lesion  . CHOLECYSTECTOMY    . FINGER GANGLION CYST EXCISION     Mucoid cyst, left middle finger  . KNEE ARTHROSCOPY     Right  . VITRECTOMY     Partial, left eye    Current Outpatient Prescriptions  Medication Sig Dispense Refill  . aspirin EC 81 MG tablet Take 1 tablet (81 mg total) by mouth daily. 90 tablet 3  . citalopram (CELEXA) 10 MG tablet Take 10 mg by mouth daily.    Marland Kitchen diltiazem (CARDIZEM CD) 120 MG 24 hr capsule TAKE (1) CAPSULE BY MOUTH ONCE DAILY. 30 capsule 6  . hydrochlorothiazide (MICROZIDE) 12.5 MG capsule Take 12.5 mg by mouth daily.     . Ibandronate Sodium (BONIVA IV) Inject into the vein  every 3 (three) months.     . isosorbide mononitrate (IMDUR) 30 MG 24 hr tablet Take 0.5 tablets (15 mg total) by mouth daily. 30 tablet 12  . losartan (COZAAR) 25 MG tablet Take 25 mg by mouth at bedtime.    . metoprolol (TOPROL-XL) 100 MG 24 hr tablet Take 100 mg by mouth daily.      . Multiple Vitamins-Minerals (ICAPS) CAPS Take 1 capsule by mouth daily.      . nitroGLYCERIN (NITROSTAT) 0.4 MG SL tablet Place 1 tablet (0.4 mg total) under the tongue every 5 (five) minutes as needed for chest pain. 25 tablet 12  . pantoprazole (PROTONIX) 40 MG tablet Take 40 mg by mouth daily.       No current facility-administered medications for this visit.    Allergies:  Amoxicillin   Social History: The patient  reports that she quit smoking about 65 years ago. Her smoking use included Cigarettes. She started smoking about 85 years ago. She smoked 0.10 packs per day. She has never used smokeless tobacco. She reports that she does not drink alcohol or use drugs.   ROS:  Please see the history of present illness. Otherwise, complete review of systems is positive for hearing loss, arthritic stiffness.  All other systems are reviewed and negative.   Physical Exam: VS:  BP 128/72   Pulse 81  Ht 5' (1.524 m)   Wt 160 lb (72.6 kg)   SpO2 95%   BMI 31.25 kg/m , BMI Body mass index is 31.25 kg/m.  Wt Readings from Last 3 Encounters:  05/24/16 160 lb (72.6 kg)  04/12/16 143 lb 1.3 oz (64.9 kg)  01/21/16 143 lb (64.9 kg)    General: Elderly woman. Has rolling walker. HEENT: Conjunctiva and lids normal, oropharynx clear. Neck: Supple, no elevated JVP or carotid bruits, no thyromegaly. Lungs: Clear to auscultation, nonlabored breathing at rest. Cardiac: Regular rate and rhythm, no S3, 3/6systolic murmur, no pericardial rub. Abdomen: Soft, nontender, bowel sounds present. Extremities: Mild lower leg edema, distal pulses 2+.  ECG: I personally reviewed the tracing from 08/04/2015 which showed sinus  rhythm with left bundle branch block.  Recent Labwork: 08/02/2015: ALT 10; AST 17 08/05/2015: Hemoglobin 10.5; Platelets 193 01/13/2016: BUN 19; Creatinine, Ser 1.48; Potassium 3.9; Sodium 137   Other Studies Reviewed Today:  Echocardiogram 08/03/2015: Study Conclusions  - Left ventricle: The cavity size was normal. Wall thickness was   increased in a pattern of mild LVH. Systolic function was normal.   The estimated ejection fraction was in the range of 55% to 60%.   Wall motion was normal; there were no regional wall motion   abnormalities. Doppler parameters are consistent with abnormal   left ventricular relaxation (grade 1 diastolic dysfunction).   Doppler parameters are consistent with high ventricular filling   pressure. - Aortic valve: There was severe stenosis. Mean gradient (S): 34 mm   Hg. Peak gradient (S): 62 mm Hg. VTI ratio of LVOT to aortic   valve: 0.28. Valve area (VTI): 0.79 cm^2. Valve area (Vmax): 0.79   cm^2. - Mitral valve: Calcified annulus. Mildly calcified leaflets .   There was trivial regurgitation. - Left atrium: The atrium was mildly dilated. - Right atrium: Central venous pressure (est): 8 mm Hg. - Tricuspid valve: There was physiologic regurgitation. - Pulmonary arteries: Systolic pressure could not be accurately   estimated. - Pericardium, extracardiac: There was no pericardial effusion.  Impressions:  - Mild LVH with LVEF 55-60%. Grade 1 diastolic dysfunction with   increased LV filling pressure. Mild left atrial enlargement. MAC   with mildly calcified mitral leaflets and trivial mitral   regurgitation. Severe calcific aortic stenosis as outlined above.  Assessment and Plan:  1. Severe aortic stenosis, most recent echocardiogram from 2017 as outlined above. We continue with conservative management, do not plan to pursue TAVR.  2. Ischemic heart disease without active angina on current medical therapy. She does not want to pursue invasive  evaluation which is reasonable.  Current medicines were reviewed with the patient today.  Disposition: Follow-up in 4 months.  Signed, Satira Sark, MD, [facility] 05/24/2016 2:03 PM    [facility] at [facility] [address], [place], [place] [address] Phone: [phone]; Fax: [phone]

## 2016-05-24 ENCOUNTER — Ambulatory Visit (INDEPENDENT_AMBULATORY_CARE_PROVIDER_SITE_OTHER): Payer: Medicare Other | Admitting: Cardiology

## 2016-05-24 ENCOUNTER — Encounter: Payer: Self-pay | Admitting: Cardiology

## 2016-05-24 VITALS — BP 128/72 | HR 81 | Ht 60.0 in | Wt 160.0 lb

## 2016-05-24 DIAGNOSIS — I350 Nonrheumatic aortic (valve) stenosis: Secondary | ICD-10-CM | POA: Diagnosis not present

## 2016-05-24 DIAGNOSIS — I259 Chronic ischemic heart disease, unspecified: Secondary | ICD-10-CM | POA: Diagnosis not present

## 2016-05-24 NOTE — Patient Instructions (Signed)
Medication Instructions:  Your physician recommends that you continue on your current medications as directed. Please refer to the Current Medication list given to you today.   Labwork: NONE  Testing/Procedures: NONE  Follow-Up: Your physician wants you to follow-up in: 4 MONTHS .  You will receive a reminder letter in the mail two months in advance. If you don't receive a letter, please call our office to schedule the follow-up appointment.   Any Other Special Instructions Will Be Listed Below (If Applicable).     If you need a refill on your cardiac medications before your next appointment, please call your pharmacy.   

## 2016-08-10 ENCOUNTER — Encounter (HOSPITAL_COMMUNITY): Payer: Self-pay | Admitting: Emergency Medicine

## 2016-08-10 ENCOUNTER — Emergency Department (HOSPITAL_COMMUNITY): Payer: Medicare Other

## 2016-08-10 ENCOUNTER — Inpatient Hospital Stay (HOSPITAL_COMMUNITY)
Admission: EM | Admit: 2016-08-10 | Discharge: 2016-08-13 | DRG: 690 | Disposition: A | Discharge status: Home / Self Care | Payer: Medicare Other | Attending: Internal Medicine | Admitting: Internal Medicine

## 2016-08-10 DIAGNOSIS — N3000 Acute cystitis without hematuria: Secondary | ICD-10-CM

## 2016-08-10 DIAGNOSIS — Z87891 Personal history of nicotine dependence: Secondary | ICD-10-CM | POA: Diagnosis not present

## 2016-08-10 DIAGNOSIS — S32592A Other specified fracture of left pubis, initial encounter for closed fracture: Secondary | ICD-10-CM | POA: Diagnosis present

## 2016-08-10 DIAGNOSIS — Z87440 Personal history of urinary (tract) infections: Secondary | ICD-10-CM | POA: Diagnosis not present

## 2016-08-10 DIAGNOSIS — H3530 Unspecified macular degeneration: Secondary | ICD-10-CM | POA: Diagnosis present

## 2016-08-10 DIAGNOSIS — W19XXXA Unspecified fall, initial encounter: Secondary | ICD-10-CM | POA: Diagnosis present

## 2016-08-10 DIAGNOSIS — E876 Hypokalemia: Secondary | ICD-10-CM | POA: Diagnosis present

## 2016-08-10 DIAGNOSIS — D649 Anemia, unspecified: Secondary | ICD-10-CM | POA: Diagnosis present

## 2016-08-10 DIAGNOSIS — Z9049 Acquired absence of other specified parts of digestive tract: Secondary | ICD-10-CM

## 2016-08-10 DIAGNOSIS — Z86718 Personal history of other venous thrombosis and embolism: Secondary | ICD-10-CM

## 2016-08-10 DIAGNOSIS — M4692 Unspecified inflammatory spondylopathy, cervical region: Secondary | ICD-10-CM | POA: Diagnosis present

## 2016-08-10 DIAGNOSIS — N183 Chronic kidney disease, stage 3 unspecified: Secondary | ICD-10-CM | POA: Diagnosis present

## 2016-08-10 DIAGNOSIS — F419 Anxiety disorder, unspecified: Secondary | ICD-10-CM | POA: Diagnosis present

## 2016-08-10 DIAGNOSIS — I10 Essential (primary) hypertension: Secondary | ICD-10-CM

## 2016-08-10 DIAGNOSIS — M810 Age-related osteoporosis without current pathological fracture: Secondary | ICD-10-CM | POA: Diagnosis present

## 2016-08-10 DIAGNOSIS — Z9181 History of falling: Secondary | ICD-10-CM

## 2016-08-10 DIAGNOSIS — R55 Syncope and collapse: Secondary | ICD-10-CM

## 2016-08-10 DIAGNOSIS — N3001 Acute cystitis with hematuria: Secondary | ICD-10-CM | POA: Diagnosis present

## 2016-08-10 DIAGNOSIS — Z792 Long term (current) use of antibiotics: Secondary | ICD-10-CM | POA: Diagnosis not present

## 2016-08-10 DIAGNOSIS — N390 Urinary tract infection, site not specified: Secondary | ICD-10-CM | POA: Diagnosis present

## 2016-08-10 DIAGNOSIS — Z79899 Other long term (current) drug therapy: Secondary | ICD-10-CM

## 2016-08-10 DIAGNOSIS — I350 Nonrheumatic aortic (valve) stenosis: Secondary | ICD-10-CM | POA: Diagnosis present

## 2016-08-10 DIAGNOSIS — Z7982 Long term (current) use of aspirin: Secondary | ICD-10-CM

## 2016-08-10 DIAGNOSIS — K219 Gastro-esophageal reflux disease without esophagitis: Secondary | ICD-10-CM | POA: Diagnosis present

## 2016-08-10 DIAGNOSIS — N814 Uterovaginal prolapse, unspecified: Secondary | ICD-10-CM | POA: Diagnosis present

## 2016-08-10 DIAGNOSIS — I129 Hypertensive chronic kidney disease with stage 1 through stage 4 chronic kidney disease, or unspecified chronic kidney disease: Secondary | ICD-10-CM | POA: Diagnosis present

## 2016-08-10 DIAGNOSIS — M47812 Spondylosis without myelopathy or radiculopathy, cervical region: Secondary | ICD-10-CM | POA: Diagnosis present

## 2016-08-10 DIAGNOSIS — F0390 Unspecified dementia without behavioral disturbance: Secondary | ICD-10-CM | POA: Diagnosis present

## 2016-08-10 DIAGNOSIS — R52 Pain, unspecified: Secondary | ICD-10-CM

## 2016-08-10 DIAGNOSIS — D638 Anemia in other chronic diseases classified elsewhere: Secondary | ICD-10-CM | POA: Diagnosis present

## 2016-08-10 HISTORY — DX: Chest pain, unspecified: R07.9

## 2016-08-10 HISTORY — DX: Age-related osteoporosis without current pathological fracture: M81.0

## 2016-08-10 HISTORY — DX: Cardiac murmur, unspecified: R01.1

## 2016-08-10 HISTORY — DX: Chronic kidney disease, stage 3 (moderate): N18.3

## 2016-08-10 HISTORY — DX: Nonrheumatic aortic (valve) stenosis: I35.0

## 2016-08-10 LAB — URINALYSIS, ROUTINE W REFLEX MICROSCOPIC
Bilirubin Urine: NEGATIVE
GLUCOSE, UA: NEGATIVE mg/dL
KETONES UR: NEGATIVE mg/dL
Nitrite: NEGATIVE
PROTEIN: 100 mg/dL — AB
Specific Gravity, Urine: 1.011 (ref 1.005–1.030)
pH: 6 (ref 5.0–8.0)

## 2016-08-10 LAB — HEPATIC FUNCTION PANEL
ALT: 12 U/L — ABNORMAL LOW (ref 14–54)
AST: 25 U/L (ref 15–41)
Albumin: 2.7 g/dL — ABNORMAL LOW (ref 3.5–5.0)
Alkaline Phosphatase: 61 U/L (ref 38–126)
BILIRUBIN DIRECT: 0.1 mg/dL (ref 0.1–0.5)
BILIRUBIN TOTAL: 0.9 mg/dL (ref 0.3–1.2)
Indirect Bilirubin: 0.8 mg/dL (ref 0.3–0.9)
Total Protein: 5.8 g/dL — ABNORMAL LOW (ref 6.5–8.1)

## 2016-08-10 LAB — BASIC METABOLIC PANEL
Anion gap: 9 (ref 5–15)
BUN: 20 mg/dL (ref 6–20)
CO2: 28 mmol/L (ref 22–32)
CREATININE: 1.46 mg/dL — AB (ref 0.44–1.00)
Calcium: 8.3 mg/dL — ABNORMAL LOW (ref 8.9–10.3)
Chloride: 100 mmol/L — ABNORMAL LOW (ref 101–111)
GFR calc Af Amer: 34 mL/min — ABNORMAL LOW (ref >60)
GFR calc non Af Amer: 29 mL/min — ABNORMAL LOW (ref >60)
Glucose, Bld: 155 mg/dL — ABNORMAL HIGH (ref 65–99)
POTASSIUM: 3.4 mmol/L — AB (ref 3.5–5.1)
Sodium: 137 mmol/L (ref 135–145)

## 2016-08-10 LAB — CBC WITH DIFFERENTIAL/PLATELET
BASOS ABS: 0 10*3/uL (ref 0.0–0.1)
BASOS PCT: 0 %
EOS PCT: 0 %
Eosinophils Absolute: 0 10*3/uL (ref 0.0–0.7)
HCT: 32.2 % — ABNORMAL LOW (ref 36.0–46.0)
Hemoglobin: 10 g/dL — ABNORMAL LOW (ref 12.0–15.0)
Lymphocytes Relative: 4 %
Lymphs Abs: 0.4 10*3/uL — ABNORMAL LOW (ref 0.7–4.0)
MCH: 27.8 pg (ref 26.0–34.0)
MCHC: 31.1 g/dL (ref 30.0–36.0)
MCV: 89.4 fL (ref 78.0–100.0)
MONO ABS: 0.8 10*3/uL (ref 0.1–1.0)
Monocytes Relative: 9 %
Neutro Abs: 8.1 10*3/uL — ABNORMAL HIGH (ref 1.7–7.7)
Neutrophils Relative %: 87 %
PLATELETS: 218 10*3/uL (ref 150–400)
RBC: 3.6 MIL/uL — ABNORMAL LOW (ref 3.87–5.11)
RDW: 15.4 % (ref 11.5–15.5)
WBC: 9.3 10*3/uL (ref 4.0–10.5)

## 2016-08-10 LAB — TROPONIN I

## 2016-08-10 MED ORDER — FERROUS SULFATE 325 (65 FE) MG PO TABS
325.0000 mg | ORAL_TABLET | Freq: Every day | ORAL | Status: DC
  Administered 2016-08-11 – 2016-08-13 (×3): 325 mg via ORAL
  Filled 2016-08-10 (×3): qty 1

## 2016-08-10 MED ORDER — CIPROFLOXACIN IN D5W 400 MG/200ML IV SOLN
400.0000 mg | INTRAVENOUS | Status: DC
  Administered 2016-08-11 – 2016-08-12 (×2): 400 mg via INTRAVENOUS
  Filled 2016-08-10 (×2): qty 200

## 2016-08-10 MED ORDER — ACETAMINOPHEN 650 MG RE SUPP: 650.0000 mg | Freq: Four times a day (QID) | RECTAL | Status: DC | PRN

## 2016-08-10 MED ORDER — ONDANSETRON HCL 4 MG/2ML IJ SOLN: 4.0000 mg | Freq: Four times a day (QID) | INTRAMUSCULAR | Status: DC | PRN

## 2016-08-10 MED ORDER — CITALOPRAM HYDROBROMIDE 20 MG PO TABS
10.0000 mg | ORAL_TABLET | Freq: Every day | ORAL | Status: DC
Start: 2016-08-11 — End: 2016-08-13
  Administered 2016-08-11 – 2016-08-13 (×3): 10 mg via ORAL
  Filled 2016-08-10 (×3): qty 1

## 2016-08-10 MED ORDER — ONDANSETRON HCL 4 MG PO TABS: 4.0000 mg | ORAL_TABLET | Freq: Four times a day (QID) | ORAL | Status: DC | PRN

## 2016-08-10 MED ORDER — ISOSORBIDE MONONITRATE ER 30 MG PO TB24
15.0000 mg | ORAL_TABLET | Freq: Every day | ORAL | Status: DC
  Administered 2016-08-11 – 2016-08-13 (×3): 15 mg via ORAL
  Filled 2016-08-10 (×3): qty 1

## 2016-08-10 MED ORDER — SODIUM CHLORIDE 0.9 % IV BOLUS (SEPSIS)
1000.0000 mL | Freq: Once | INTRAVENOUS | Status: AC
  Administered 2016-08-10: 1000 mL via INTRAVENOUS

## 2016-08-10 MED ORDER — POTASSIUM CHLORIDE IN NACL 20-0.9 MEQ/L-% IV SOLN
INTRAVENOUS | Status: DC
  Administered 2016-08-10: 19:00:00 via INTRAVENOUS
  Filled 2016-08-10: qty 1000

## 2016-08-10 MED ORDER — METOPROLOL SUCCINATE ER 50 MG PO TB24
50.0000 mg | ORAL_TABLET | Freq: Every day | ORAL | Status: DC
  Administered 2016-08-11 – 2016-08-13 (×3): 50 mg via ORAL
  Filled 2016-08-10 (×4): qty 1

## 2016-08-10 MED ORDER — HYDROCODONE-ACETAMINOPHEN 5-325 MG PO TABS: 1.0000 | ORAL_TABLET | ORAL | Status: DC | PRN

## 2016-08-10 MED ORDER — ASPIRIN EC 81 MG PO TBEC
81.0000 mg | DELAYED_RELEASE_TABLET | Freq: Every day | ORAL | Status: DC
  Administered 2016-08-10 – 2016-08-13 (×4): 81 mg via ORAL
  Filled 2016-08-10 (×4): qty 1

## 2016-08-10 MED ORDER — CIPROFLOXACIN IN D5W 400 MG/200ML IV SOLN
400.0000 mg | Freq: Once | INTRAVENOUS | Status: AC
  Administered 2016-08-10: 400 mg via INTRAVENOUS
  Filled 2016-08-10: qty 200

## 2016-08-10 MED ORDER — ACETAMINOPHEN 325 MG PO TABS: 650.0000 mg | ORAL_TABLET | Freq: Four times a day (QID) | ORAL | Status: DC | PRN

## 2016-08-10 MED ORDER — HYDROCHLOROTHIAZIDE 12.5 MG PO CAPS
12.5000 mg | ORAL_CAPSULE | Freq: Every day | ORAL | Status: DC
  Administered 2016-08-11 – 2016-08-13 (×3): 12.5 mg via ORAL
  Filled 2016-08-10 (×3): qty 1

## 2016-08-10 MED ORDER — POTASSIUM CHLORIDE CRYS ER 20 MEQ PO TBCR
20.0000 meq | EXTENDED_RELEASE_TABLET | Freq: Every day | ORAL | Status: AC
  Administered 2016-08-10 – 2016-08-12 (×3): 20 meq via ORAL
  Filled 2016-08-10 (×3): qty 1

## 2016-08-10 MED ORDER — PANTOPRAZOLE SODIUM 40 MG PO TBEC
40.0000 mg | DELAYED_RELEASE_TABLET | Freq: Every day | ORAL | Status: DC
  Administered 2016-08-11 – 2016-08-13 (×3): 40 mg via ORAL
  Filled 2016-08-10 (×3): qty 1

## 2016-08-10 MED ORDER — NITROGLYCERIN 0.4 MG SL SUBL: 0.4000 mg | SUBLINGUAL_TABLET | SUBLINGUAL | Status: DC | PRN

## 2016-08-10 MED ORDER — HEPARIN SODIUM (PORCINE) 5000 UNIT/ML IJ SOLN
5000.0000 [IU] | Freq: Three times a day (TID) | INTRAMUSCULAR | Status: DC
  Administered 2016-08-10 – 2016-08-13 (×8): 5000 [IU] via SUBCUTANEOUS
  Filled 2016-08-10 (×8): qty 1

## 2016-08-10 MED ORDER — DILTIAZEM HCL ER COATED BEADS 120 MG PO CP24
120.0000 mg | ORAL_CAPSULE | Freq: Every day | ORAL | Status: DC
  Administered 2016-08-11 – 2016-08-13 (×3): 120 mg via ORAL
  Filled 2016-08-10 (×3): qty 1

## 2016-08-10 MED ORDER — OCUVITE-LUTEIN PO CAPS
1.0000 | ORAL_CAPSULE | Freq: Every day | ORAL | Status: DC
  Administered 2016-08-11 – 2016-08-13 (×3): 1 via ORAL
  Filled 2016-08-10 (×3): qty 1

## 2016-08-10 MED ORDER — DOCUSATE SODIUM 100 MG PO CAPS
100.0000 mg | ORAL_CAPSULE | Freq: Two times a day (BID) | ORAL | Status: DC
  Administered 2016-08-10 – 2016-08-13 (×6): 100 mg via ORAL
  Filled 2016-08-10 (×6): qty 1

## 2016-08-10 NOTE — ED Provider Notes (Signed)
[facility] DEPT Provider Note   CSN: [id number] Arrival date & time: 08/10/16  1153     History   Chief Complaint Chief Complaint  Patient presents with  . Fall  . Altered Mental Status    HPI Kristin Rubio is a 83 y.o. female.  Patient states that she awoke on the floor today. She does remember passing out. Her caregiver come by in the morning and found her on the floor in the bedroom and the patient was mildly confused at that point but his doctor normal now   The history is provided by the patient and a caregiver. No language interpreter was used.  Fall  This is a new problem. The current episode started 12 to 24 hours ago. The problem occurs rarely. The problem has been resolved. Pertinent negatives include no chest pain, no abdominal pain and no headaches. Nothing aggravates the symptoms. Nothing relieves the symptoms. She has tried nothing for the symptoms. The treatment provided no relief.    Past Medical History:  Diagnosis Date  . Anxiety   . Essential hypertension, benign   . GERD (gastroesophageal reflux disease)   . History of DVT (deep vein thrombosis)   . Lumbar disc disease   . Macular degeneration   . Osteoporosis   . Palpitations   . Renal insufficiency   . Uterine prolapse     Patient Active Problem List   Diagnosis Date Noted  . UTI (urinary tract infection) 08/10/2016  . Severe aortic stenosis 08/19/2015  . Chronic renal insufficiency, stage III (moderate) 08/19/2015  . Chest pain with moderate risk of acute coronary syndrome 08/02/2015  . Cardiac murmur 02/02/2014  . Osteoporosis 09/29/2011  . Essential hypertension, benign 11/15/2009  . Palpitations 11/15/2009    Past Surgical History:  Procedure Laterality Date  . CERVICAL POLYPECTOMY     Excision of interest cervical/ endometrial lesion  . CHOLECYSTECTOMY    . FINGER GANGLION CYST EXCISION     Mucoid cyst, left middle finger  . KNEE ARTHROSCOPY     Right  . VITRECTOMY     Partial, left eye    OB History    No data available       Home Medications    Prior to Admission medications   Medication Sig Start Date End Date Taking? Authorizing Provider  aspirin EC 81 MG tablet Take 1 tablet (81 mg total) by mouth daily. 08/19/15  Yes Kilroy, Luke K, PA-C  citalopram (CELEXA) 10 MG tablet Take 10 mg by mouth daily.   Yes [provider]  diltiazem (CARDIZEM CD) 120 MG 24 hr capsule TAKE (1) CAPSULE BY MOUTH ONCE DAILY. 04/17/16  Yes Jonelle Sidle, MD  ferrous sulfate 325 (65 FE) MG tablet Take 325 mg by mouth daily with breakfast.   Yes [provider]  hydrochlorothiazide (MICROZIDE) 12.5 MG capsule Take 12.5 mg by mouth daily.    Yes [provider]  isosorbide mononitrate (IMDUR) 30 MG 24 hr tablet Take 0.5 tablets (15 mg total) by mouth daily. 08/05/15  Yes Carylon Perches, MD  metoprolol (TOPROL-XL) 100 MG 24 hr tablet Take 100 mg by mouth daily.     Yes [provider]  Multiple Vitamins-Minerals (ICAPS) CAPS Take 1 capsule by mouth daily.     Yes [provider]  nitroGLYCERIN (NITROSTAT) 0.4 MG SL tablet Place 1 tablet (0.4 mg total) under the tongue every 5 (five) minutes as needed for chest pain. 08/05/15  Yes Carylon Perches, MD  OVER THE COUNTER MEDICATION Take 1 tablet by mouth daily.    Yes [provider]  pantoprazole (PROTONIX) 40 MG tablet Take 40 mg by mouth daily.     Yes [provider]    Family History Family History  Problem Relation Age of Onset  . Cancer Paternal Aunt   . Stroke Paternal Uncle     Social History Social History  Substance Use Topics  . Smoking status: Former Smoker    Packs/day: 0.10    Types: Cigarettes    Start date: 02/03/1931    Quit date: 02/03/1951  . Smokeless tobacco: Never Used  . Alcohol use No     Allergies   Amoxicillin   Review of Systems Review of Systems  Constitutional: Negative for appetite change and fatigue.  HENT: Negative for  congestion, ear discharge and sinus pressure.   Eyes: Negative for discharge.  Respiratory: Negative for cough.   Cardiovascular: Negative for chest pain.  Gastrointestinal: Negative for abdominal pain and diarrhea.  Genitourinary: Negative for frequency and hematuria.  Musculoskeletal: Negative for back pain.  Skin: Negative for rash.  Neurological: Positive for syncope. Negative for seizures and headaches.  Psychiatric/Behavioral: Negative for hallucinations.     Physical Exam Updated Vital Signs BP (!) 116/52   Pulse 71   Temp 98.3 F (36.8 C) (Oral)   Resp 17   Ht 5\' 2"  (1.575 m)   Wt 73.5 kg (162 lb)   SpO2 93%   BMI 29.63 kg/m   Physical Exam  Constitutional: She is oriented to person, place, and time. She appears well-developed.  HENT:  Head: Normocephalic.  Eyes: Conjunctivae and EOM are normal. No scleral icterus.  Neck: Neck supple. No thyromegaly present.  Cardiovascular: Normal rate and regular rhythm.  Exam reveals no gallop and no friction rub.   No murmur heard. Pulmonary/Chest: No stridor. She has no wheezes. She has no rales. She exhibits no tenderness.  Abdominal: She exhibits no distension. There is no tenderness. There is no rebound.  Musculoskeletal: Normal range of motion. She exhibits no edema.  Lymphadenopathy:    She has no cervical adenopathy.  Neurological: She is oriented to person, place, and time. She exhibits normal muscle tone. Coordination normal.  Skin: No rash noted. No erythema.  Psychiatric: She has a normal mood and affect. Her behavior is normal.     ED Treatments / Results  Labs (all labs ordered are listed, but only abnormal results are displayed) Labs Reviewed  CBC WITH DIFFERENTIAL/PLATELET - Abnormal; Notable for the following:       Result Value   RBC 3.60 (*)    Hemoglobin 10.0 (*)    HCT 32.2 (*)    Neutro Abs 8.1 (*)    Lymphs Abs 0.4 (*)    All other components within normal limits  BASIC METABOLIC PANEL -  Abnormal; Notable for the following:    Potassium 3.4 (*)    Chloride 100 (*)    Glucose, Bld 155 (*)    Creatinine, Ser 1.46 (*)    Calcium 8.3 (*)    GFR calc non Af Amer 29 (*)    GFR calc Af Amer 34 (*)    All other components within normal limits  URINALYSIS, ROUTINE W REFLEX MICROSCOPIC - Abnormal; Notable for the following:    Color, Urine AMBER (*)    APPearance TURBID (*)    Hgb urine dipstick MODERATE (*)    Protein, ur 100 (*)    Leukocytes, UA  MODERATE (*)    Bacteria, UA RARE (*)    Squamous Epithelial / LPF 6-30 (*)    All other components within normal limits  HEPATIC FUNCTION PANEL - Abnormal; Notable for the following:    Total Protein 5.8 (*)    Albumin 2.7 (*)    ALT 12 (*)    All other components within normal limits  URINE CULTURE  TROPONIN I    EKG  EKG Interpretation  Date/Time:  Thursday August 10 2016 11:57:42 EDT Ventricular Rate:  80 PR Interval:    QRS Duration: 143 QT Interval:  445 QTC Calculation: 514 R Axis:   -28 Text Interpretation:  Sinus rhythm Left bundle branch block Confirmed by Bethann Berkshire [phone]) on 08/10/2016 1:10:45 PM       Radiology Dg Chest 2 View  Result Date: 08/10/2016 CLINICAL DATA:  Found on floor this morning, uncertain how patient fell, history essential benign hypertension, former smoker EXAM: CHEST  2 VIEW COMPARISON:  08/02/2015 FINDINGS: Upper normal heart size. Normal mediastinal contours and pulmonary vascularity. Bibasilar atelectasis and central peribronchial thickening. No acute infiltrate, pleural effusion or pneumothorax. Diffuse osseous demineralization with BILATERAL glenohumeral degenerative changes. IMPRESSION: Bronchitic changes with bibasilar atelectasis. Electronically Signed   By: Ulyses Southward M.D.   On: 08/10/2016 14:55   Dg Pelvis 1-2 Views  Result Date: 08/10/2016 CLINICAL DATA:  Found on floor this morning, uncertain of how she fell EXAM: PELVIS - 1-2 VIEW COMPARISON:  Lumbar spine radiographs  12/12/2010, 08/31/2008 FINDINGS: Severe osseous demineralization. Hip and SI joint spaces preserved. Fractures of the LEFT superior and inferior pubic rami, new since 2010 but of uncertain age. No additional fracture, dislocation or bone destruction. Degenerative disc and facet disease changes at visualized lower lumbar spine. IMPRESSION: Age-indeterminate LEFT superior and inferior pubic ramus fractures. Marked osseous demineralization. Electronically Signed   By: Ulyses Southward M.D.   On: 08/10/2016 15:06   Ct Head Wo Contrast  Result Date: 08/10/2016 CLINICAL DATA:  Unwitnessed fall.  Found on floor.  Confusion. EXAM: CT HEAD WITHOUT CONTRAST CT CERVICAL SPINE WITHOUT CONTRAST TECHNIQUE: Multidetector CT imaging of the head and cervical spine was performed following the standard protocol without intravenous contrast. Multiplanar CT image reconstructions of the cervical spine were also generated. COMPARISON:  Head CT 01/09/2016 FINDINGS: CT HEAD FINDINGS Brain: No evidence of acute hemorrhage, hydrocephalus, extra-axial collection or mass lesion/mass effect. Hypoattenuated appearance of the pons. Advanced brain parenchymal volume loss and periventricular microangiopathy. Vascular: Calcific atherosclerotic disease at the skullbase. Skull: Normal. Negative for fracture or focal lesion. Sinuses/Orbits: Chronic ethmoid sinusitis, otherwise normal. Other: None. CT CERVICAL SPINE FINDINGS Alignment: 1-2 mm anterolisthesis of C4 on C5, C5 on C6 and C7 on T1, likely degenerative. Skull base and vertebrae: No acute fracture. No primary bone lesion or focal pathologic process. Soft tissues and spinal canal: No prevertebral fluid or swelling. No visible canal hematoma. Disc levels: Multilevel osteoarthritic changes with disc space narrowing, endplate sclerosis and osteophytosis. Posterior facet arthropathy, moderate to advanced. Degenerative ankylosing of the lateral process of C4 and C5 on the left. Upper chest: Negative.  Other: Calcific atherosclerotic disease of the carotid arteries. IMPRESSION: No evidence of acute traumatic injury to the head. Hypoattenuated appearance of the pons, nonspecific finding, as this part of the brain is prone to artifacts. If clinical suspicion for pontine infarct, then MRI of the head should be considered. Advanced brain parenchymal volume loss and periventricular microangiopathy. No evidence of acute traumatic injury to the cervical spine.  Multilevel osteoarthritic changes, moderate to severe, with minimal anterolisthesis at several levels of the mid and lower cervical spine. Electronically Signed   By: Ted Mcalpine M.D.   On: 08/10/2016 14:56   Ct Cervical Spine Wo Contrast  Result Date: 08/10/2016 CLINICAL DATA:  Unwitnessed fall.  Found on floor.  Confusion. EXAM: CT HEAD WITHOUT CONTRAST CT CERVICAL SPINE WITHOUT CONTRAST TECHNIQUE: Multidetector CT imaging of the head and cervical spine was performed following the standard protocol without intravenous contrast. Multiplanar CT image reconstructions of the cervical spine were also generated. COMPARISON:  Head CT 01/09/2016 FINDINGS: CT HEAD FINDINGS Brain: No evidence of acute hemorrhage, hydrocephalus, extra-axial collection or mass lesion/mass effect. Hypoattenuated appearance of the pons. Advanced brain parenchymal volume loss and periventricular microangiopathy. Vascular: Calcific atherosclerotic disease at the skullbase. Skull: Normal. Negative for fracture or focal lesion. Sinuses/Orbits: Chronic ethmoid sinusitis, otherwise normal. Other: None. CT CERVICAL SPINE FINDINGS Alignment: 1-2 mm anterolisthesis of C4 on C5, C5 on C6 and C7 on T1, likely degenerative. Skull base and vertebrae: No acute fracture. No primary bone lesion or focal pathologic process. Soft tissues and spinal canal: No prevertebral fluid or swelling. No visible canal hematoma. Disc levels: Multilevel osteoarthritic changes with disc space narrowing, endplate  sclerosis and osteophytosis. Posterior facet arthropathy, moderate to advanced. Degenerative ankylosing of the lateral process of C4 and C5 on the left. Upper chest: Negative. Other: Calcific atherosclerotic disease of the carotid arteries. IMPRESSION: No evidence of acute traumatic injury to the head. Hypoattenuated appearance of the pons, nonspecific finding, as this part of the brain is prone to artifacts. If clinical suspicion for pontine infarct, then MRI of the head should be considered. Advanced brain parenchymal volume loss and periventricular microangiopathy. No evidence of acute traumatic injury to the cervical spine. Multilevel osteoarthritic changes, moderate to severe, with minimal anterolisthesis at several levels of the mid and lower cervical spine. Electronically Signed   By: Ted Mcalpine M.D.   On: 08/10/2016 14:56   Dg Hand Complete Left  Result Date: 08/10/2016 CLINICAL DATA:  Patient was found on the floor this morning but caregiver. Bruising of the left hand was noted. EXAM: LEFT HAND - COMPLETE 3+ VIEW COMPARISON:  Right wrist series of May 20, 2007 FINDINGS: The bones are subjectively osteopenic. The phalanges appear intact. There is moderate narrowing of the interphalangeal and metacarpophalangeal joints. There is moderate degenerative narrowing of the first [facility] joint as well as of the articulation of the scaphoid with the trapezium and trapezoid. The contour of the scaphoid is irregular and may indicate previous fracture. The radiocarpal joint is mildly narrowed. The previous distal radial metaphyseal fracture has healed. The ulna is intact. Joints IMPRESSION: No acute fracture or dislocation is observed. There is diffuse osteoarthritic joint space loss as described. Electronically Signed   By: David  [place] M.D.   On: 08/10/2016 12:47    Procedures Procedures (including critical care time)  Medications Ordered in ED Medications  sodium chloride 0.9 % bolus 1,000 mL (1,000  mLs Intravenous New Bag/Given 08/10/16 1321)  ciprofloxacin (CIPRO) IVPB 400 mg (400 mg Intravenous New Bag/Given 08/10/16 1456)     Initial Impression / Assessment and Plan / ED Course  I have reviewed the triage vital signs and the nursing notes.  Pertinent labs & imaging results that were available during my care of the patient were reviewed by me and considered in my medical decision making (see chart for details).     Patient with urinary tract infection and  syncope she will be admitted to medicine  Final Clinical Impressions(s) / ED Diagnoses   Final diagnoses:  Acute cystitis with hematuria    New Prescriptions New Prescriptions   No medications on file     Bethann BerkshireZammit, Layten Aiken, MD 08/10/16 1622

## 2016-08-10 NOTE — Progress Notes (Signed)
Pharmacy Antibiotic Note  Kristin NordmannSara O Rubio is a 83 y.o. female admitted on 08/10/2016 with UTI.  Pharmacy has been consulted for Cipro dosing. Cipro 400 mg Iv given in ED  Plan: Cipro 400 mg IV every 24 hours F/U oral therapy when appropriate Labs per protocol   Height: 5\' 2"  (157.5 cm) Weight: 162 lb (73.5 kg) IBW/kg (Calculated) : 50.1  Temp (24hrs), Avg:98.2 F (36.8 C), Min:98 F (36.7 C), Max:98.3 F (36.8 C)   Recent Labs Lab 08/10/16 1225  WBC 9.3  CREATININE 1.46*    Estimated Creatinine Clearance: 20.7 mL/min (A) (by C-G formula based on SCr of 1.46 mg/dL (H)).    Allergies  Allergen Reactions  . Amoxicillin Rash    Has patient had a PCN reaction causing immediate rash, facial/tongue/throat swelling, SOB or lightheadedness with hypotension: Yes Has patient had a PCN reaction causing severe rash involving mucus membranes or skin necrosis: No Has patient had a PCN reaction that required hospitalization: No Has patient had a PCN reaction occurring within the last 10 years: No If all of the above answers are "NO", then may proceed with Cephalosporin use.     Antimicrobials this admission: Cipro 7/26 >>    Dose adjustments this admission:   Thank you for allowing pharmacy to be a part of this patient's care.  Josephine Igoittman, Deone Leifheit Bennett 08/10/2016 7:39 PM

## 2016-08-10 NOTE — ED Notes (Signed)
Attempted report x2, rn unavailable

## 2016-08-10 NOTE — H&P (Addendum)
History and Physical    Kristin Rubio NWG:[id number] DOB: 07/01/1917 DOA: 08/10/2016  PCP: Carylon PerchesFagan, Roy, MD  Patient coming from: Home  I have personally briefly reviewed patient's old medical records in [facility]  Chief Complaint: Fall and loss of consciousness.  HPI: Kristin Rubio is a 83 y.o. female with medical history significant for severe aortic stenosis, hypertension, UTI, chronic kidney disease, and chest pain, who presents from home after being found by her caretaker (Mrs. Carlynn SprySands) this morning on the floor of her bedroom. Patient reports that she was getting up to use the bathroom last night and passed out. She regained consciousness, but was too weak to get up. She does not recall having chest pain, palpitations, dizziness, nausea, or vomiting prior to passing out. She denies hitting her head, but bruised her left wrist and scraped her left leg. She has had some nausea, but no vomiting. She denies any recent history of fever, chills, diarrhea, or pain with urination. Mrs. Carlynn SprySands found the patient this morning on the floor of her bedroom. The patient was apparently alert and aware of her surroundings, but was otherwise a little confused. There was bleeding from the laceration on the floor.  ED Course: In the ED, the patient was afebrile and hemodynamically stable, but her blood pressure was on the lower end of normal. Her lab data were significant for a hemoglobin of 10.0, potassium of 3.4, creatinine of 146, and glucose of 155. Her urinalysis revealed moderate leukocytes, too numerous to count RBCs, WBC clumps, and too numerous to count WBCs. Radiographic studies revealed age indeterminate left superior and inferior pubic ramus fractures; severe cervical arthropathy with no acute traumatic injury to the C-spine; no acute traumatic injury to the head; and no acute fracture or dislocation of the left hand, but with diffuse osteoarthritic joint space loss. She is being admitted for further  evaluation and management.  Review of Systems: As per HPI otherwise 10 point review of systems negative. There is some forgetfulness as well.  Past Medical History:  Diagnosis Date  . Anxiety   . Cardiac murmur 02/02/2014  . Chest pain with moderate risk of acute coronary syndrome 08/02/2015  . CKD (chronic kidney disease), stage III 08/19/2015  . Essential hypertension, benign   . GERD (gastroesophageal reflux disease)   . History of DVT (deep vein thrombosis)   . Lumbar disc disease   . Macular degeneration   . Osteoporosis   . Osteoporosis 09/29/2011  . Palpitations   . Renal insufficiency   . Severe aortic stenosis 08/19/2015  . Uterine prolapse     Past Surgical History:  Procedure Laterality Date  . CERVICAL POLYPECTOMY     Excision of interest cervical/ endometrial lesion  . CHOLECYSTECTOMY    . FINGER GANGLION CYST EXCISION     Mucoid cyst, left middle finger  . KNEE ARTHROSCOPY     Right  . VITRECTOMY     Partial, left eye    Social history: She is widowed. Has 3 children. She has a sitter/caretaker daily from 9 AM to 6 PM. She reports that she quit smoking about 65 years ago. Her smoking use included Cigarettes. She started smoking about 85 years ago. She smoked 0.10 packs per day. She has never used smokeless tobacco. She reports that she does not drink alcohol or use drugs. She ambulates with a walker.  Allergies  Allergen Reactions  . Amoxicillin Rash    Has patient had a PCN reaction causing immediate  rash, facial/tongue/throat swelling, SOB or lightheadedness with hypotension: Yes Has patient had a PCN reaction causing severe rash involving mucus membranes or skin necrosis: No Has patient had a PCN reaction that required hospitalization: No Has patient had a PCN reaction occurring within the last 10 years: No If all of the above answers are "NO", then may proceed with Cephalosporin use.     Family History  Problem Relation Age of Onset  . Cancer Paternal  Aunt   . Stroke Paternal Uncle      Prior to Admission medications   Medication Sig Start Date End Date Taking? Authorizing Provider  aspirin EC 81 MG tablet Take 1 tablet (81 mg total) by mouth daily. 08/19/15  Yes Kilroy, Luke K, PA-C  citalopram (CELEXA) 10 MG tablet Take 10 mg by mouth daily.   Yes [provider]  diltiazem (CARDIZEM CD) 120 MG 24 hr capsule TAKE (1) CAPSULE BY MOUTH ONCE DAILY. 04/17/16  Yes Jonelle Sidle, MD  ferrous sulfate 325 (65 FE) MG tablet Take 325 mg by mouth daily with breakfast.   Yes [provider]  hydrochlorothiazide (MICROZIDE) 12.5 MG capsule Take 12.5 mg by mouth daily.    Yes [provider]  isosorbide mononitrate (IMDUR) 30 MG 24 hr tablet Take 0.5 tablets (15 mg total) by mouth daily. 08/05/15  Yes Carylon Perches, MD  metoprolol (TOPROL-XL) 100 MG 24 hr tablet Take 100 mg by mouth daily.     Yes [provider]  Multiple Vitamins-Minerals (ICAPS) CAPS Take 1 capsule by mouth daily.     Yes [provider]  nitroGLYCERIN (NITROSTAT) 0.4 MG SL tablet Place 1 tablet (0.4 mg total) under the tongue every 5 (five) minutes as needed for chest pain. 08/05/15  Yes Carylon Perches, MD  OVER THE COUNTER MEDICATION Take 1 tablet by mouth daily.    Yes [provider]  pantoprazole (PROTONIX) 40 MG tablet Take 40 mg by mouth daily.     Yes [provider]    Physical Exam: Vitals:   08/10/16 1600 08/10/16 1630 08/10/16 1700 08/10/16 1730  BP: (!) 116/57 101/87 (!) 105/51 (!) 122/54  Pulse:      Resp: 17 16 18 20   Temp:      TempSrc:      SpO2:      Weight:      Height:        Constitutional: NAD, calm, comfortable Vitals:   08/10/16 1600 08/10/16 1630 08/10/16 1700 08/10/16 1730  BP: (!) 116/57 101/87 (!) 105/51 (!) 122/54  Pulse:      Resp: 17 16 18 20   Temp:      TempSrc:      SpO2:      Weight:      Height:       Temperature 98.3. Oxygen saturation 94% on room air. Pulse  71. Scalp/head: no lacerations or nodules palpated Eyes: PERRL, lids and conjunctivae normal ENMT: Mucous membranes are moist. Posterior pharynx clear of any exudate or lesions.Normal dentition.  Neck: normal, supple, no masses, no thyromegaly Respiratory: Rare/occasional bibasilar crackles. Normal respiratory effort. No accessory muscle use.  Cardiovascular: S1, S2, with 3/6 systolic murmur. Trace bilateral lower extremity edema. 2+ pedal pulses. No carotid bruits.  Abdomen: no tenderness, no masses palpated. No hepatosplenomegaly. Bowel sounds positive.  Musculoskeletal: no clubbing / cyanosis. Thoracic Pulses noted. No tenderness upon palpation of pelvis. Good ROM-in the sitting position, no contractures. Normal muscle tone.  Skin: Discolored skin/ecchymosis of the  left wrist/hand; superficial linear laceration of the distal left leg with a small amount of bleeding; old ecchymotic lesions of both legs with scaliness. No induration Neurologic: CN 2-12 grossly intact. Sensation intact, DTR normal. Strength 5/5 in all 4-in the sitting position she is able to raise both legs against gravity. Fairly good handgrip bilaterally.  Psychiatric: Normal judgment and insight. Alert and oriented x 3-Place, present, and year.. Normal mood.     Labs on Admission: I have personally reviewed following labs and imaging studies  CBC:  Recent Labs Lab 08/10/16 1225  WBC 9.3  NEUTROABS 8.1*  HGB 10.0*  HCT 32.2*  MCV 89.4  PLT 218   Basic Metabolic Panel:  Recent Labs Lab 08/10/16 1225  NA 137  K 3.4*  CL 100*  CO2 28  GLUCOSE 155*  BUN 20  CREATININE 1.46*  CALCIUM 8.3*   GFR: Estimated Creatinine Clearance: 20.7 mL/min (A) (by C-G formula based on SCr of 1.46 mg/dL (H)). Liver Function Tests:  Recent Labs Lab 08/10/16 1331  AST 25  ALT 12*  ALKPHOS 61  BILITOT 0.9  PROT 5.8*  ALBUMIN 2.7*   No results for input(s): LIPASE, AMYLASE in the last 168 hours. No results for  input(s): AMMONIA in the last 168 hours. Coagulation Profile: No results for input(s): INR, PROTIME in the last 168 hours. Cardiac Enzymes:  Recent Labs Lab 08/10/16 1331  TROPONINI <0.03   BNP (last 3 results) No results for input(s): PROBNP in the last 8760 hours. HbA1C: No results for input(s): HGBA1C in the last 72 hours. CBG: No results for input(s): GLUCAP in the last 168 hours. Lipid Profile: No results for input(s): CHOL, HDL, LDLCALC, TRIG, CHOLHDL, LDLDIRECT in the last 72 hours. Thyroid Function Tests: No results for input(s): TSH, T4TOTAL, FREET4, T3FREE, THYROIDAB in the last 72 hours. Anemia Panel: No results for input(s): VITAMINB12, FOLATE, FERRITIN, TIBC, IRON, RETICCTPCT in the last 72 hours. Urine analysis:    Component Value Date/Time   COLORURINE AMBER (A) 08/10/2016 1218   APPEARANCEUR TURBID (A) 08/10/2016 1218   LABSPEC 1.011 08/10/2016 1218   PHURINE 6.0 08/10/2016 1218   GLUCOSEU NEGATIVE 08/10/2016 1218   HGBUR MODERATE (A) 08/10/2016 1218   BILIRUBINUR NEGATIVE 08/10/2016 1218   KETONESUR NEGATIVE 08/10/2016 1218   PROTEINUR 100 (A) 08/10/2016 1218   UROBILINOGEN 0.2 07/04/2014 0822   NITRITE NEGATIVE 08/10/2016 1218   LEUKOCYTESUR MODERATE (A) 08/10/2016 1218    Radiological Exams on Admission: Dg Chest 2 View  Result Date: 08/10/2016 CLINICAL DATA:  Found on floor this morning, uncertain how patient fell, history essential benign hypertension, former smoker EXAM: CHEST  2 VIEW COMPARISON:  08/02/2015 FINDINGS: Upper normal heart size. Normal mediastinal contours and pulmonary vascularity. Bibasilar atelectasis and central peribronchial thickening. No acute infiltrate, pleural effusion or pneumothorax. Diffuse osseous demineralization with BILATERAL glenohumeral degenerative changes. IMPRESSION: Bronchitic changes with bibasilar atelectasis. Electronically Signed   By: Ulyses SouthwardMark  Boles M.D.   On: 08/10/2016 14:55   Dg Pelvis 1-2 Views  Result  Date: 08/10/2016 CLINICAL DATA:  Found on floor this morning, uncertain of how she fell EXAM: PELVIS - 1-2 VIEW COMPARISON:  Lumbar spine radiographs 12/12/2010, 08/31/2008 FINDINGS: Severe osseous demineralization. Hip and SI joint spaces preserved. Fractures of the LEFT superior and inferior pubic rami, new since 2010 but of uncertain age. No additional fracture, dislocation or bone destruction. Degenerative disc and facet disease changes at visualized lower lumbar spine. IMPRESSION: Age-indeterminate LEFT superior and inferior pubic ramus fractures. Marked  osseous demineralization. Electronically Signed   By: Ulyses Southward M.D.   On: 08/10/2016 15:06   Ct Head Wo Contrast  Result Date: 08/10/2016 CLINICAL DATA:  Unwitnessed fall.  Found on floor.  Confusion. EXAM: CT HEAD WITHOUT CONTRAST CT CERVICAL SPINE WITHOUT CONTRAST TECHNIQUE: Multidetector CT imaging of the head and cervical spine was performed following the standard protocol without intravenous contrast. Multiplanar CT image reconstructions of the cervical spine were also generated. COMPARISON:  Head CT 01/09/2016 FINDINGS: CT HEAD FINDINGS Brain: No evidence of acute hemorrhage, hydrocephalus, extra-axial collection or mass lesion/mass effect. Hypoattenuated appearance of the pons. Advanced brain parenchymal volume loss and periventricular microangiopathy. Vascular: Calcific atherosclerotic disease at the skullbase. Skull: Normal. Negative for fracture or focal lesion. Sinuses/Orbits: Chronic ethmoid sinusitis, otherwise normal. Other: None. CT CERVICAL SPINE FINDINGS Alignment: 1-2 mm anterolisthesis of C4 on C5, C5 on C6 and C7 on T1, likely degenerative. Skull base and vertebrae: No acute fracture. No primary bone lesion or focal pathologic process. Soft tissues and spinal canal: No prevertebral fluid or swelling. No visible canal hematoma. Disc levels: Multilevel osteoarthritic changes with disc space narrowing, endplate sclerosis and  osteophytosis. Posterior facet arthropathy, moderate to advanced. Degenerative ankylosing of the lateral process of C4 and C5 on the left. Upper chest: Negative. Other: Calcific atherosclerotic disease of the carotid arteries. IMPRESSION: No evidence of acute traumatic injury to the head. Hypoattenuated appearance of the pons, nonspecific finding, as this part of the brain is prone to artifacts. If clinical suspicion for pontine infarct, then MRI of the head should be considered. Advanced brain parenchymal volume loss and periventricular microangiopathy. No evidence of acute traumatic injury to the cervical spine. Multilevel osteoarthritic changes, moderate to severe, with minimal anterolisthesis at several levels of the mid and lower cervical spine. Electronically Signed   By: Ted Mcalpine M.D.   On: 08/10/2016 14:56   Ct Cervical Spine Wo Contrast  Result Date: 08/10/2016 CLINICAL DATA:  Unwitnessed fall.  Found on floor.  Confusion. EXAM: CT HEAD WITHOUT CONTRAST CT CERVICAL SPINE WITHOUT CONTRAST TECHNIQUE: Multidetector CT imaging of the head and cervical spine was performed following the standard protocol without intravenous contrast. Multiplanar CT image reconstructions of the cervical spine were also generated. COMPARISON:  Head CT 01/09/2016 FINDINGS: CT HEAD FINDINGS Brain: No evidence of acute hemorrhage, hydrocephalus, extra-axial collection or mass lesion/mass effect. Hypoattenuated appearance of the pons. Advanced brain parenchymal volume loss and periventricular microangiopathy. Vascular: Calcific atherosclerotic disease at the skullbase. Skull: Normal. Negative for fracture or focal lesion. Sinuses/Orbits: Chronic ethmoid sinusitis, otherwise normal. Other: None. CT CERVICAL SPINE FINDINGS Alignment: 1-2 mm anterolisthesis of C4 on C5, C5 on C6 and C7 on T1, likely degenerative. Skull base and vertebrae: No acute fracture. No primary bone lesion or focal pathologic process. Soft tissues  and spinal canal: No prevertebral fluid or swelling. No visible canal hematoma. Disc levels: Multilevel osteoarthritic changes with disc space narrowing, endplate sclerosis and osteophytosis. Posterior facet arthropathy, moderate to advanced. Degenerative ankylosing of the lateral process of C4 and C5 on the left. Upper chest: Negative. Other: Calcific atherosclerotic disease of the carotid arteries. IMPRESSION: No evidence of acute traumatic injury to the head. Hypoattenuated appearance of the pons, nonspecific finding, as this part of the brain is prone to artifacts. If clinical suspicion for pontine infarct, then MRI of the head should be considered. Advanced brain parenchymal volume loss and periventricular microangiopathy. No evidence of acute traumatic injury to the cervical spine. Multilevel osteoarthritic changes, moderate to severe,  with minimal anterolisthesis at several levels of the mid and lower cervical spine. Electronically Signed   By: Ted Mcalpine M.D.   On: 08/10/2016 14:56   Dg Hand Complete Left  Result Date: 08/10/2016 CLINICAL DATA:  Patient was found on the floor this morning but caregiver. Bruising of the left hand was noted. EXAM: LEFT HAND - COMPLETE 3+ VIEW COMPARISON:  Right wrist series of May 20, 2007 FINDINGS: The bones are subjectively osteopenic. The phalanges appear intact. There is moderate narrowing of the interphalangeal and metacarpophalangeal joints. There is moderate degenerative narrowing of the first [facility] joint as well as of the articulation of the scaphoid with the trapezium and trapezoid. The contour of the scaphoid is irregular and may indicate previous fracture. The radiocarpal joint is mildly narrowed. The previous distal radial metaphyseal fracture has healed. The ulna is intact. Joints IMPRESSION: No acute fracture or dislocation is observed. There is diffuse osteoarthritic joint space loss as described. Electronically Signed   By: David  [place] M.D.   On:  08/10/2016 12:47    EKG: Independently reviewed.   Assessment/Plan Principal Problem:   UTI (urinary tract infection) Active Problems:   Severe aortic stenosis   Syncope and collapse   Essential hypertension, benign   CKD (chronic kidney disease), stage III   Anemia   Closed fracture of multiple pubic rami, left, initial encounter (HCC)   Arthropathy of cervical spine (HCC)    1. Syncope with collapse. The patient's head CT reveal no obvious acute abnormalities. She is currently alert and oriented 3 and has no difficulty speaking or understanding. She is quite articulate. -Her initial troponin I was negative. We'll hold on further cycling of cardiac enzymes. -In light of her alertness and comprehension, will hold on ordering an MRI of the brain for further evaluation. -The probable etiology of her syncope is likely the UTI. However, due to her severe aortic stenosis, it could also be a possible etiology. -For further evaluation, will assess her thyroid function and vitamin B12 level.  Urinary tract infection/acute cystitis with microhematuria Results of the urinalysis is noted and is consistent with a UTI. -Cipro was given in the ED. Will continue Cipro; dosing per pharmacy. -Urine culture was ordered and is pending.  Severe aortic stenosis. -Patient has a noticeable harsh systolic murmur on exam. 2-D echo 07/2015 revealed severe aortic stenosis; EF of 55-60% and grade 1 diastolic dysfunction. -Given her age, this carries a high mortality.  Hypertension. Patient is treated chronically with HCTZ, metoprolol, and diltiazem. -Due to her soft blood pressures, will hold her antihypertensive medications overnight. Will decrease the dose of metoprolol to half (50 mg daily). -We'll gently hydrate.  Stage III chronic kidney disease. The patient's baseline creatinine ranges from 1.38-1.48. It is 1.46, at baseline. -Patient was given IV fluids in the ED. Will continue gentle IV fluids  at 60 cc per hour.  Mild hypokalemia. Patient's serum potassium was 3.4 on admission. This may be secondary to HCTZ. -We'll add potassium to the IV fluids and give her daily potassium 3 days. -Continue to monitor her serum potassium.  Left pubic rami fractures. Fractures were noted on the pelvic x-ray, but they are age-indeterminate. Apparently she has had some falls at home in the past. -She could benefit from PT evaluation and possible short-term SNF for rehabilitation. -PT consulted.  CODE STATUS. Patient was asked about CODE STATUS in layman's terms and she clearly wanted to be full code. -Would defer further discussion of CODE STATUS with  the patient and her family to her PCP, Dr. Ouida Sills.    DVT prophylaxis: Subcutaneous heparin Code Status: Full code as discussed with patient Family Communication: Discussed with caretaker, Mrs. Carlynn Spry Disposition Plan: Discharge to home with assistance versus short-term skilled nursing facility placement. Consults called: None  Admission status: Inpatient   Haidynn Almendarez MD [facility] Pager [phone]  If 7PM-7AM, please contact night-coverage [email or web address] Password [facility]  08/10/2016, 6:11 PM

## 2016-08-10 NOTE — ED Triage Notes (Signed)
Pt has sitter from 9am to 6pm. Pt stays alone at night. Sitter left about 6pm last night and found her at 9 this am on floor and confused. Pt alert/oriented to some but seeing things on ceiling like birds and horses. Pt denies pain anywhere. Has skin tear noted to left lower leg. Left hand with bruising, radial pulses present.

## 2016-08-11 LAB — BASIC METABOLIC PANEL
Anion gap: 7 (ref 5–15)
BUN: 18 mg/dL (ref 6–20)
CALCIUM: 7.6 mg/dL — AB (ref 8.9–10.3)
CO2: 27 mmol/L (ref 22–32)
CREATININE: 1.26 mg/dL — AB (ref 0.44–1.00)
Chloride: 103 mmol/L (ref 101–111)
GFR calc Af Amer: 40 mL/min — ABNORMAL LOW (ref >60)
GFR, EST NON AFRICAN AMERICAN: 35 mL/min — AB (ref >60)
GLUCOSE: 90 mg/dL (ref 65–99)
POTASSIUM: 3.7 mmol/L (ref 3.5–5.1)
Sodium: 137 mmol/L (ref 135–145)

## 2016-08-11 LAB — CBC
HCT: 28.3 % — ABNORMAL LOW (ref 36.0–46.0)
Hemoglobin: 8.7 g/dL — ABNORMAL LOW (ref 12.0–15.0)
MCH: 26.9 pg (ref 26.0–34.0)
MCHC: 30.7 g/dL (ref 30.0–36.0)
MCV: 87.3 fL (ref 78.0–100.0)
PLATELETS: 204 10*3/uL (ref 150–400)
RBC: 3.24 MIL/uL — ABNORMAL LOW (ref 3.87–5.11)
RDW: 15.8 % — AB (ref 11.5–15.5)
WBC: 7.6 10*3/uL (ref 4.0–10.5)

## 2016-08-11 LAB — VITAMIN B12: VITAMIN B 12: 324 pg/mL (ref 180–914)

## 2016-08-11 LAB — GLUCOSE, CAPILLARY: Glucose-Capillary: 78 mg/dL (ref 65–99)

## 2016-08-11 LAB — TSH: TSH: 0.746 u[IU]/mL (ref 0.350–4.500)

## 2016-08-11 NOTE — Progress Notes (Signed)
Subjective: She was admitted yesterday after falling during the night and staying on the floor all night. CT of the head and cervical spine revealed no acute abnormalities. She states she blacked out. She has a history of severe aortic stenosis and the plan has been to observe this without intervention. She also has a UTI and had a mild hypokalemia which has corrected overnight. She denies dysuria. She was found to have pelvic fractures but denies any hip or pelvic pain. She feels that she is able to walk without pain. These fractures were felt to be indeterminate in age by x-ray.  Objective: Vital signs in last 24 hours: Vitals:   08/10/16 1900 08/10/16 1930 08/10/16 2054 08/11/16 0656  BP: (!) 108/44 (!) 127/55 (!) 128/43 (!) 122/58  Pulse:  76 75 77  Resp: 19 19  18   Temp:   98.2 F (36.8 C) 98.4 F (36.9 C)  TempSrc:   Oral Oral  SpO2:  93% 100% 100%  Weight:   163 lb 11.2 oz (74.3 kg)   Height:   5\' 1"  (1.549 m)    Weight change:   Intake/Output Summary (Last 24 hours) at 08/11/16 0741 Last data filed at 08/11/16 0656  Gross per 24 hour  Intake              677 ml  Output              300 ml  Net              377 ml    Physical Exam: This morning she is alert but mildly confused. Head appears atraumatic. Lungs clear. Heart regular with a grade 3 systolic murmur. Abdomen is soft and nontender with no hepatosplenomegaly. Extremities reveal lower leg bruising. No edema. No focal weakness noted. Face symmetric.  Lab Results:    Results for orders placed or performed during the hospital encounter of 08/10/16 (from the past 24 hour(s))  Urinalysis, Routine w reflex microscopic     Status: Abnormal   Collection Time: 08/10/16 12:18 PM  Result Value Ref Range   Color, Urine AMBER (A) YELLOW   APPearance TURBID (A) CLEAR   Specific Gravity, Urine 1.011 1.005 - 1.030   pH 6.0 5.0 - 8.0   Glucose, UA NEGATIVE NEGATIVE mg/dL   Hgb urine dipstick MODERATE (A) NEGATIVE   Bilirubin  Urine NEGATIVE NEGATIVE   Ketones, ur NEGATIVE NEGATIVE mg/dL   Protein, ur 161100 (A) NEGATIVE mg/dL   Nitrite NEGATIVE NEGATIVE   Leukocytes, UA MODERATE (A) NEGATIVE   RBC / HPF TOO NUMEROUS TO COUNT 0 - 5 RBC/hpf   WBC, UA TOO NUMEROUS TO COUNT 0 - 5 WBC/hpf   Bacteria, UA RARE (A) NONE SEEN   Squamous Epithelial / LPF 6-30 (A) NONE SEEN   WBC Clumps PRESENT   CBC with Differential     Status: Abnormal   Collection Time: 08/10/16 12:25 PM  Result Value Ref Range   WBC 9.3 4.0 - 10.5 K/uL   RBC 3.60 (L) 3.87 - 5.11 MIL/uL   Hemoglobin 10.0 (L) 12.0 - 15.0 g/dL   HCT 09.632.2 (L) 04.536.0 - 40.946.0 %   MCV 89.4 78.0 - 100.0 fL   MCH 27.8 26.0 - 34.0 pg   MCHC 31.1 30.0 - 36.0 g/dL   RDW 81.115.4 91.411.5 - 78.215.5 %   Platelets 218 150 - 400 K/uL   Neutrophils Relative % 87 %   Neutro Abs 8.1 (H) 1.7 - 7.7 K/uL  Lymphocytes Relative 4 %   Lymphs Abs 0.4 (L) 0.7 - 4.0 K/uL   Monocytes Relative 9 %   Monocytes Absolute 0.8 0.1 - 1.0 K/uL   Eosinophils Relative 0 %   Eosinophils Absolute 0.0 0.0 - 0.7 K/uL   Basophils Relative 0 %   Basophils Absolute 0.0 0.0 - 0.1 K/uL  Basic metabolic panel     Status: Abnormal   Collection Time: 08/10/16 12:25 PM  Result Value Ref Range   Sodium 137 135 - 145 mmol/L   Potassium 3.4 (L) 3.5 - 5.1 mmol/L   Chloride 100 (L) 101 - 111 mmol/L   CO2 28 22 - 32 mmol/L   Glucose, Bld 155 (H) 65 - 99 mg/dL   BUN 20 6 - 20 mg/dL   Creatinine, Ser 6.571.46 (H) 0.44 - 1.00 mg/dL   Calcium 8.3 (L) 8.9 - 10.3 mg/dL   GFR calc non Af Amer 29 (L) >60 mL/min   GFR calc Af Amer 34 (L) >60 mL/min   Anion gap 9 5 - 15  Troponin I     Status: None   Collection Time: 08/10/16  1:31 PM  Result Value Ref Range   Troponin I <0.03 <0.03 ng/mL  Hepatic function panel     Status: Abnormal   Collection Time: 08/10/16  1:31 PM  Result Value Ref Range   Total Protein 5.8 (L) 6.5 - 8.1 g/dL   Albumin 2.7 (L) 3.5 - 5.0 g/dL   AST 25 15 - 41 U/L   ALT 12 (L) 14 - 54 U/L   Alkaline  Phosphatase 61 38 - 126 U/L   Total Bilirubin 0.9 0.3 - 1.2 mg/dL   Bilirubin, Direct 0.1 0.1 - 0.5 mg/dL   Indirect Bilirubin 0.8 0.3 - 0.9 mg/dL  TSH     Status: None   Collection Time: 08/11/16  4:59 AM  Result Value Ref Range   TSH 0.746 0.350 - 4.500 uIU/mL  CBC     Status: Abnormal   Collection Time: 08/11/16  4:59 AM  Result Value Ref Range   WBC 7.6 4.0 - 10.5 K/uL   RBC 3.24 (L) 3.87 - 5.11 MIL/uL   Hemoglobin 8.7 (L) 12.0 - 15.0 g/dL   HCT 84.628.3 (L) 96.236.0 - 95.246.0 %   MCV 87.3 78.0 - 100.0 fL   MCH 26.9 26.0 - 34.0 pg   MCHC 30.7 30.0 - 36.0 g/dL   RDW 84.115.8 (H) 32.411.5 - 40.115.5 %   Platelets 204 150 - 400 K/uL  Basic metabolic panel     Status: Abnormal   Collection Time: 08/11/16  4:59 AM  Result Value Ref Range   Sodium 137 135 - 145 mmol/L   Potassium 3.7 3.5 - 5.1 mmol/L   Chloride 103 101 - 111 mmol/L   CO2 27 22 - 32 mmol/L   Glucose, Bld 90 65 - 99 mg/dL   BUN 18 6 - 20 mg/dL   Creatinine, Ser 0.271.26 (H) 0.44 - 1.00 mg/dL   Calcium 7.6 (L) 8.9 - 10.3 mg/dL   GFR calc non Af Amer 35 (L) >60 mL/min   GFR calc Af Amer 40 (L) >60 mL/min   Anion gap 7 5 - 15     ABGS No results for input(s): PHART, PO2ART, TCO2, HCO3 in the last 72 hours.  Invalid input(s): PCO2 CULTURES No results found for this or any previous visit (from the past 240 hour(s)). Studies/Results: Dg Chest 2 View  Result Date: 08/10/2016 CLINICAL DATA:  Found on floor this morning, uncertain how patient fell, history essential benign hypertension, former smoker EXAM: CHEST  2 VIEW COMPARISON:  08/02/2015 FINDINGS: Upper normal heart size. Normal mediastinal contours and pulmonary vascularity. Bibasilar atelectasis and central peribronchial thickening. No acute infiltrate, pleural effusion or pneumothorax. Diffuse osseous demineralization with BILATERAL glenohumeral degenerative changes. IMPRESSION: Bronchitic changes with bibasilar atelectasis. Electronically Signed   By: Ulyses Southward M.D.   On:  08/10/2016 14:55   Dg Pelvis 1-2 Views  Result Date: 08/10/2016 CLINICAL DATA:  Found on floor this morning, uncertain of how she fell EXAM: PELVIS - 1-2 VIEW COMPARISON:  Lumbar spine radiographs 12/12/2010, 08/31/2008 FINDINGS: Severe osseous demineralization. Hip and SI joint spaces preserved. Fractures of the LEFT superior and inferior pubic rami, new since 2010 but of uncertain age. No additional fracture, dislocation or bone destruction. Degenerative disc and facet disease changes at visualized lower lumbar spine. IMPRESSION: Age-indeterminate LEFT superior and inferior pubic ramus fractures. Marked osseous demineralization. Electronically Signed   By: Ulyses Southward M.D.   On: 08/10/2016 15:06   Ct Head Wo Contrast  Result Date: 08/10/2016 CLINICAL DATA:  Unwitnessed fall.  Found on floor.  Confusion. EXAM: CT HEAD WITHOUT CONTRAST CT CERVICAL SPINE WITHOUT CONTRAST TECHNIQUE: Multidetector CT imaging of the head and cervical spine was performed following the standard protocol without intravenous contrast. Multiplanar CT image reconstructions of the cervical spine were also generated. COMPARISON:  Head CT 01/09/2016 FINDINGS: CT HEAD FINDINGS Brain: No evidence of acute hemorrhage, hydrocephalus, extra-axial collection or mass lesion/mass effect. Hypoattenuated appearance of the pons. Advanced brain parenchymal volume loss and periventricular microangiopathy. Vascular: Calcific atherosclerotic disease at the skullbase. Skull: Normal. Negative for fracture or focal lesion. Sinuses/Orbits: Chronic ethmoid sinusitis, otherwise normal. Other: None. CT CERVICAL SPINE FINDINGS Alignment: 1-2 mm anterolisthesis of C4 on C5, C5 on C6 and C7 on T1, likely degenerative. Skull base and vertebrae: No acute fracture. No primary bone lesion or focal pathologic process. Soft tissues and spinal canal: No prevertebral fluid or swelling. No visible canal hematoma. Disc levels: Multilevel osteoarthritic changes with disc  space narrowing, endplate sclerosis and osteophytosis. Posterior facet arthropathy, moderate to advanced. Degenerative ankylosing of the lateral process of C4 and C5 on the left. Upper chest: Negative. Other: Calcific atherosclerotic disease of the carotid arteries. IMPRESSION: No evidence of acute traumatic injury to the head. Hypoattenuated appearance of the pons, nonspecific finding, as this part of the brain is prone to artifacts. If clinical suspicion for pontine infarct, then MRI of the head should be considered. Advanced brain parenchymal volume loss and periventricular microangiopathy. No evidence of acute traumatic injury to the cervical spine. Multilevel osteoarthritic changes, moderate to severe, with minimal anterolisthesis at several levels of the mid and lower cervical spine. Electronically Signed   By: Ted Mcalpine M.D.   On: 08/10/2016 14:56   Ct Cervical Spine Wo Contrast  Result Date: 08/10/2016 CLINICAL DATA:  Unwitnessed fall.  Found on floor.  Confusion. EXAM: CT HEAD WITHOUT CONTRAST CT CERVICAL SPINE WITHOUT CONTRAST TECHNIQUE: Multidetector CT imaging of the head and cervical spine was performed following the standard protocol without intravenous contrast. Multiplanar CT image reconstructions of the cervical spine were also generated. COMPARISON:  Head CT 01/09/2016 FINDINGS: CT HEAD FINDINGS Brain: No evidence of acute hemorrhage, hydrocephalus, extra-axial collection or mass lesion/mass effect. Hypoattenuated appearance of the pons. Advanced brain parenchymal volume loss and periventricular microangiopathy. Vascular: Calcific atherosclerotic disease at the skullbase. Skull: Normal. Negative for fracture or focal lesion. Sinuses/Orbits: Chronic  ethmoid sinusitis, otherwise normal. Other: None. CT CERVICAL SPINE FINDINGS Alignment: 1-2 mm anterolisthesis of C4 on C5, C5 on C6 and C7 on T1, likely degenerative. Skull base and vertebrae: No acute fracture. No primary bone lesion or  focal pathologic process. Soft tissues and spinal canal: No prevertebral fluid or swelling. No visible canal hematoma. Disc levels: Multilevel osteoarthritic changes with disc space narrowing, endplate sclerosis and osteophytosis. Posterior facet arthropathy, moderate to advanced. Degenerative ankylosing of the lateral process of C4 and C5 on the left. Upper chest: Negative. Other: Calcific atherosclerotic disease of the carotid arteries. IMPRESSION: No evidence of acute traumatic injury to the head. Hypoattenuated appearance of the pons, nonspecific finding, as this part of the brain is prone to artifacts. If clinical suspicion for pontine infarct, then MRI of the head should be considered. Advanced brain parenchymal volume loss and periventricular microangiopathy. No evidence of acute traumatic injury to the cervical spine. Multilevel osteoarthritic changes, moderate to severe, with minimal anterolisthesis at several levels of the mid and lower cervical spine. Electronically Signed   By: Ted Mcalpine M.D.   On: 08/10/2016 14:56   Dg Hand Complete Left  Result Date: 08/10/2016 CLINICAL DATA:  Patient was found on the floor this morning but caregiver. Bruising of the left hand was noted. EXAM: LEFT HAND - COMPLETE 3+ VIEW COMPARISON:  Right wrist series of May 20, 2007 FINDINGS: The bones are subjectively osteopenic. The phalanges appear intact. There is moderate narrowing of the interphalangeal and metacarpophalangeal joints. There is moderate degenerative narrowing of the first [facility] joint as well as of the articulation of the scaphoid with the trapezium and trapezoid. The contour of the scaphoid is irregular and may indicate previous fracture. The radiocarpal joint is mildly narrowed. The previous distal radial metaphyseal fracture has healed. The ulna is intact. Joints IMPRESSION: No acute fracture or dislocation is observed. There is diffuse osteoarthritic joint space loss as described. Electronically  Signed   By: David  [place] M.D.   On: 08/10/2016 12:47   Micro Results: No results found for this or any previous visit (from the past 240 hour(s)). Studies/Results: Dg Chest 2 View  Result Date: 08/10/2016 CLINICAL DATA:  Found on floor this morning, uncertain how patient fell, history essential benign hypertension, former smoker EXAM: CHEST  2 VIEW COMPARISON:  08/02/2015 FINDINGS: Upper normal heart size. Normal mediastinal contours and pulmonary vascularity. Bibasilar atelectasis and central peribronchial thickening. No acute infiltrate, pleural effusion or pneumothorax. Diffuse osseous demineralization with BILATERAL glenohumeral degenerative changes. IMPRESSION: Bronchitic changes with bibasilar atelectasis. Electronically Signed   By: Ulyses Southward M.D.   On: 08/10/2016 14:55   Dg Pelvis 1-2 Views  Result Date: 08/10/2016 CLINICAL DATA:  Found on floor this morning, uncertain of how she fell EXAM: PELVIS - 1-2 VIEW COMPARISON:  Lumbar spine radiographs 12/12/2010, 08/31/2008 FINDINGS: Severe osseous demineralization. Hip and SI joint spaces preserved. Fractures of the LEFT superior and inferior pubic rami, new since 2010 but of uncertain age. No additional fracture, dislocation or bone destruction. Degenerative disc and facet disease changes at visualized lower lumbar spine. IMPRESSION: Age-indeterminate LEFT superior and inferior pubic ramus fractures. Marked osseous demineralization. Electronically Signed   By: Ulyses Southward M.D.   On: 08/10/2016 15:06   Ct Head Wo Contrast  Result Date: 08/10/2016 CLINICAL DATA:  Unwitnessed fall.  Found on floor.  Confusion. EXAM: CT HEAD WITHOUT CONTRAST CT CERVICAL SPINE WITHOUT CONTRAST TECHNIQUE: Multidetector CT imaging of the head and cervical spine was performed following the  standard protocol without intravenous contrast. Multiplanar CT image reconstructions of the cervical spine were also generated. COMPARISON:  Head CT 01/09/2016 FINDINGS: CT HEAD  FINDINGS Brain: No evidence of acute hemorrhage, hydrocephalus, extra-axial collection or mass lesion/mass effect. Hypoattenuated appearance of the pons. Advanced brain parenchymal volume loss and periventricular microangiopathy. Vascular: Calcific atherosclerotic disease at the skullbase. Skull: Normal. Negative for fracture or focal lesion. Sinuses/Orbits: Chronic ethmoid sinusitis, otherwise normal. Other: None. CT CERVICAL SPINE FINDINGS Alignment: 1-2 mm anterolisthesis of C4 on C5, C5 on C6 and C7 on T1, likely degenerative. Skull base and vertebrae: No acute fracture. No primary bone lesion or focal pathologic process. Soft tissues and spinal canal: No prevertebral fluid or swelling. No visible canal hematoma. Disc levels: Multilevel osteoarthritic changes with disc space narrowing, endplate sclerosis and osteophytosis. Posterior facet arthropathy, moderate to advanced. Degenerative ankylosing of the lateral process of C4 and C5 on the left. Upper chest: Negative. Other: Calcific atherosclerotic disease of the carotid arteries. IMPRESSION: No evidence of acute traumatic injury to the head. Hypoattenuated appearance of the pons, nonspecific finding, as this part of the brain is prone to artifacts. If clinical suspicion for pontine infarct, then MRI of the head should be considered. Advanced brain parenchymal volume loss and periventricular microangiopathy. No evidence of acute traumatic injury to the cervical spine. Multilevel osteoarthritic changes, moderate to severe, with minimal anterolisthesis at several levels of the mid and lower cervical spine. Electronically Signed   By: Ted Mcalpine M.D.   On: 08/10/2016 14:56   Ct Cervical Spine Wo Contrast  Result Date: 08/10/2016 CLINICAL DATA:  Unwitnessed fall.  Found on floor.  Confusion. EXAM: CT HEAD WITHOUT CONTRAST CT CERVICAL SPINE WITHOUT CONTRAST TECHNIQUE: Multidetector CT imaging of the head and cervical spine was performed following the  standard protocol without intravenous contrast. Multiplanar CT image reconstructions of the cervical spine were also generated. COMPARISON:  Head CT 01/09/2016 FINDINGS: CT HEAD FINDINGS Brain: No evidence of acute hemorrhage, hydrocephalus, extra-axial collection or mass lesion/mass effect. Hypoattenuated appearance of the pons. Advanced brain parenchymal volume loss and periventricular microangiopathy. Vascular: Calcific atherosclerotic disease at the skullbase. Skull: Normal. Negative for fracture or focal lesion. Sinuses/Orbits: Chronic ethmoid sinusitis, otherwise normal. Other: None. CT CERVICAL SPINE FINDINGS Alignment: 1-2 mm anterolisthesis of C4 on C5, C5 on C6 and C7 on T1, likely degenerative. Skull base and vertebrae: No acute fracture. No primary bone lesion or focal pathologic process. Soft tissues and spinal canal: No prevertebral fluid or swelling. No visible canal hematoma. Disc levels: Multilevel osteoarthritic changes with disc space narrowing, endplate sclerosis and osteophytosis. Posterior facet arthropathy, moderate to advanced. Degenerative ankylosing of the lateral process of C4 and C5 on the left. Upper chest: Negative. Other: Calcific atherosclerotic disease of the carotid arteries. IMPRESSION: No evidence of acute traumatic injury to the head. Hypoattenuated appearance of the pons, nonspecific finding, as this part of the brain is prone to artifacts. If clinical suspicion for pontine infarct, then MRI of the head should be considered. Advanced brain parenchymal volume loss and periventricular microangiopathy. No evidence of acute traumatic injury to the cervical spine. Multilevel osteoarthritic changes, moderate to severe, with minimal anterolisthesis at several levels of the mid and lower cervical spine. Electronically Signed   By: Ted Mcalpine M.D.   On: 08/10/2016 14:56   Dg Hand Complete Left  Result Date: 08/10/2016 CLINICAL DATA:  Patient was found on the floor this  morning but caregiver. Bruising of the left hand was noted. EXAM: LEFT HAND -  COMPLETE 3+ VIEW COMPARISON:  Right wrist series of May 20, 2007 FINDINGS: The bones are subjectively osteopenic. The phalanges appear intact. There is moderate narrowing of the interphalangeal and metacarpophalangeal joints. There is moderate degenerative narrowing of the first [facility] joint as well as of the articulation of the scaphoid with the trapezium and trapezoid. The contour of the scaphoid is irregular and may indicate previous fracture. The radiocarpal joint is mildly narrowed. The previous distal radial metaphyseal fracture has healed. The ulna is intact. Joints IMPRESSION: No acute fracture or dislocation is observed. There is diffuse osteoarthritic joint space loss as described. Electronically Signed   By: David  [place] M.D.   On: 08/10/2016 12:47   Medications:  I have reviewed the patient's current medications Scheduled Meds: . aspirin EC  81 mg Oral Daily  . citalopram  10 mg Oral Daily  . diltiazem  120 mg Oral Daily  . docusate sodium  100 mg Oral BID  . ferrous sulfate  325 mg Oral Q breakfast  . heparin  5,000 Units Subcutaneous Q8H  . hydrochlorothiazide  12.5 mg Oral Daily  . isosorbide mononitrate  15 mg Oral Daily  . metoprolol succinate  50 mg Oral Daily  . multivitamin-lutein  1 capsule Oral Daily  . pantoprazole  40 mg Oral Daily  . potassium chloride  20 mEq Oral Daily   Continuous Infusions: . 0.9 % NaCl with KCl 20 mEq / L 60 mL/hr at 08/10/16 1922  . ciprofloxacin     PRN Meds:.acetaminophen **OR** acetaminophen, HYDROcodone-acetaminophen, nitroGLYCERIN, ondansetron **OR** ondansetron (ZOFRAN) IV   Assessment/Plan: #1. Syncope in the setting of severe aortic stenosis. No intervention planned. Observe. #2. UTI. Continue Cipro which was started intravenously. Culture pending. Asymptomatic. No fever. #3. Hypokalemia. Resolved. #4. Anemia. Continue iron. #5. Chronic kidney disease.  Creatinine modestly improved 1.2 with hydration. This is her baseline. #6. Mild dementia. Stable. #7. Left pelvic fractures. Do not appear acute. Principal Problem:   UTI (urinary tract infection) Active Problems:   Essential hypertension, benign   Severe aortic stenosis   CKD (chronic kidney disease), stage III   Syncope and collapse   Anemia   Closed fracture of multiple pubic rami, left, initial encounter (HCC)   Arthropathy of cervical spine (HCC)     LOS: 1 day   Juston Goheen 08/11/2016, 7:41 AM

## 2016-08-11 NOTE — Plan of Care (Signed)
Problem: Safety: Goal: Ability to remain free from injury will improve Outcome: Not Progressing Pt and family educated pt on her being a High fall risk d/t multiple falls in the past. Pt has pelvic fracture from previous fall that, fall mat placed on floor beside bed. Pt confused tonight trying to get OOB "to go home". Bed alarm has been on all night and pt continues to be reoriented by RN and NT. Bed in lowest locked position, call bell within reach, SR up. Pt educated to use call light if she needs anything and pt verbalized understanding. Will continue to monitor pt.

## 2016-08-11 NOTE — Care Management Important Message (Signed)
Important Message  Patient Details  Name: Kristin Rubio MRN: [id number] Date of Birth: 01/31/1917   Medicare Important Message Given:  Yes    Malcolm MetroChildress, Kyrra Prada Demske, RN 08/11/2016, 3:13 PM

## 2016-08-11 NOTE — Progress Notes (Signed)
Passed on in report that I was not able to do the daily dressing change on my shift.  Passed on in report to GuadalupeNicole whom stated that she would do the dressing change.

## 2016-08-12 LAB — URINE CULTURE

## 2016-08-12 LAB — HEMOGLOBIN A1C
Hgb A1c MFr Bld: 5.3 % (ref 4.8–5.6)
Mean Plasma Glucose: 105 mg/dL

## 2016-08-12 NOTE — Progress Notes (Signed)
Patient in bed appears frail lives alone daughter is coming in later today we'll plan on tentative discharge tomorrow if she is able to sit in chair without symptoms currently on Cipro for UTI likewise will continue this as an outpatient with outpatient PT and RN Nancy NordmannSara O Scantling ZHY:[id number]:[id number] DOB: 09/27/1917 DOA: 08/10/2016 PCP: Carylon PerchesFagan, Roy, MD   Physical Exam: Blood pressure (!) 128/45, pulse 78, temperature 98.6 F (37 C), temperature source Oral, resp. rate 18, height 5\' 1"  (1.549 m), weight 75.7 kg (166 lb 14.2 oz), SpO2 96 %. Lungs diminished breath sounds in the bases prolonged respiratory phase no rales audible no wheezes audible scattered rhonchi present bilaterally heart 2/6 aortic flow murmur no S3 no auscultated no heaves thrills rubs   Investigations:  Recent Results (from the past 240 hour(s))  Urine Culture     Status: Abnormal   Collection Time: 08/10/16 12:18 PM  Result Value Ref Range Status   Specimen Description URINE, CLEAN CATCH  Final   Special Requests NONE  Final   Culture MULTIPLE SPECIES PRESENT, SUGGEST RECOLLECTION (A)  Final   Report Status 08/12/2016 FINAL  Final     Basic Metabolic Panel:  Recent Labs  29/52/8407/26/18 1225 08/11/16 0459  NA 137 137  K 3.4* 3.7  CL 100* 103  CO2 28 27  GLUCOSE 155* 90  BUN 20 18  CREATININE 1.46* 1.26*  CALCIUM 8.3* 7.6*   Liver Function Tests:  Recent Labs  08/10/16 1331  AST 25  ALT 12*  ALKPHOS 61  BILITOT 0.9  PROT 5.8*  ALBUMIN 2.7*     CBC:  Recent Labs  08/10/16 1225 08/11/16 0459  WBC 9.3 7.6  NEUTROABS 8.1*  --   HGB 10.0* 8.7*  HCT 32.2* 28.3*  MCV 89.4 87.3  PLT 218 204    Dg Chest 2 View  Result Date: 08/10/2016 CLINICAL DATA:  Found on floor this morning, uncertain how patient fell, history essential benign hypertension, former smoker EXAM: CHEST  2 VIEW COMPARISON:  08/02/2015 FINDINGS: Upper normal heart size. Normal mediastinal contours and pulmonary vascularity. Bibasilar atelectasis  and central peribronchial thickening. No acute infiltrate, pleural effusion or pneumothorax. Diffuse osseous demineralization with BILATERAL glenohumeral degenerative changes. IMPRESSION: Bronchitic changes with bibasilar atelectasis. Electronically Signed   By: Ulyses SouthwardMark  Boles M.D.   On: 08/10/2016 14:55   Dg Pelvis 1-2 Views  Result Date: 08/10/2016 CLINICAL DATA:  Found on floor this morning, uncertain of how she fell EXAM: PELVIS - 1-2 VIEW COMPARISON:  Lumbar spine radiographs 12/12/2010, 08/31/2008 FINDINGS: Severe osseous demineralization. Hip and SI joint spaces preserved. Fractures of the LEFT superior and inferior pubic rami, new since 2010 but of uncertain age. No additional fracture, dislocation or bone destruction. Degenerative disc and facet disease changes at visualized lower lumbar spine. IMPRESSION: Age-indeterminate LEFT superior and inferior pubic ramus fractures. Marked osseous demineralization. Electronically Signed   By: Ulyses SouthwardMark  Boles M.D.   On: 08/10/2016 15:06   Ct Head Wo Contrast  Result Date: 08/10/2016 CLINICAL DATA:  Unwitnessed fall.  Found on floor.  Confusion. EXAM: CT HEAD WITHOUT CONTRAST CT CERVICAL SPINE WITHOUT CONTRAST TECHNIQUE: Multidetector CT imaging of the head and cervical spine was performed following the standard protocol without intravenous contrast. Multiplanar CT image reconstructions of the cervical spine were also generated. COMPARISON:  Head CT 01/09/2016 FINDINGS: CT HEAD FINDINGS Brain: No evidence of acute hemorrhage, hydrocephalus, extra-axial collection or mass lesion/mass effect. Hypoattenuated appearance of the pons. Advanced brain parenchymal volume loss and periventricular  microangiopathy. Vascular: Calcific atherosclerotic disease at the skullbase. Skull: Normal. Negative for fracture or focal lesion. Sinuses/Orbits: Chronic ethmoid sinusitis, otherwise normal. Other: None. CT CERVICAL SPINE FINDINGS Alignment: 1-2 mm anterolisthesis of C4 on C5, C5 on  C6 and C7 on T1, likely degenerative. Skull base and vertebrae: No acute fracture. No primary bone lesion or focal pathologic process. Soft tissues and spinal canal: No prevertebral fluid or swelling. No visible canal hematoma. Disc levels: Multilevel osteoarthritic changes with disc space narrowing, endplate sclerosis and osteophytosis. Posterior facet arthropathy, moderate to advanced. Degenerative ankylosing of the lateral process of C4 and C5 on the left. Upper chest: Negative. Other: Calcific atherosclerotic disease of the carotid arteries. IMPRESSION: No evidence of acute traumatic injury to the head. Hypoattenuated appearance of the pons, nonspecific finding, as this part of the brain is prone to artifacts. If clinical suspicion for pontine infarct, then MRI of the head should be considered. Advanced brain parenchymal volume loss and periventricular microangiopathy. No evidence of acute traumatic injury to the cervical spine. Multilevel osteoarthritic changes, moderate to severe, with minimal anterolisthesis at several levels of the mid and lower cervical spine. Electronically Signed   By: Ted Mcalpine M.D.   On: 08/10/2016 14:56   Ct Cervical Spine Wo Contrast  Result Date: 08/10/2016 CLINICAL DATA:  Unwitnessed fall.  Found on floor.  Confusion. EXAM: CT HEAD WITHOUT CONTRAST CT CERVICAL SPINE WITHOUT CONTRAST TECHNIQUE: Multidetector CT imaging of the head and cervical spine was performed following the standard protocol without intravenous contrast. Multiplanar CT image reconstructions of the cervical spine were also generated. COMPARISON:  Head CT 01/09/2016 FINDINGS: CT HEAD FINDINGS Brain: No evidence of acute hemorrhage, hydrocephalus, extra-axial collection or mass lesion/mass effect. Hypoattenuated appearance of the pons. Advanced brain parenchymal volume loss and periventricular microangiopathy. Vascular: Calcific atherosclerotic disease at the skullbase. Skull: Normal. Negative for  fracture or focal lesion. Sinuses/Orbits: Chronic ethmoid sinusitis, otherwise normal. Other: None. CT CERVICAL SPINE FINDINGS Alignment: 1-2 mm anterolisthesis of C4 on C5, C5 on C6 and C7 on T1, likely degenerative. Skull base and vertebrae: No acute fracture. No primary bone lesion or focal pathologic process. Soft tissues and spinal canal: No prevertebral fluid or swelling. No visible canal hematoma. Disc levels: Multilevel osteoarthritic changes with disc space narrowing, endplate sclerosis and osteophytosis. Posterior facet arthropathy, moderate to advanced. Degenerative ankylosing of the lateral process of C4 and C5 on the left. Upper chest: Negative. Other: Calcific atherosclerotic disease of the carotid arteries. IMPRESSION: No evidence of acute traumatic injury to the head. Hypoattenuated appearance of the pons, nonspecific finding, as this part of the brain is prone to artifacts. If clinical suspicion for pontine infarct, then MRI of the head should be considered. Advanced brain parenchymal volume loss and periventricular microangiopathy. No evidence of acute traumatic injury to the cervical spine. Multilevel osteoarthritic changes, moderate to severe, with minimal anterolisthesis at several levels of the mid and lower cervical spine. Electronically Signed   By: Ted Mcalpine M.D.   On: 08/10/2016 14:56   Dg Hand Complete Left  Result Date: 08/10/2016 CLINICAL DATA:  Patient was found on the floor this morning but caregiver. Bruising of the left hand was noted. EXAM: LEFT HAND - COMPLETE 3+ VIEW COMPARISON:  Right wrist series of May 20, 2007 FINDINGS: The bones are subjectively osteopenic. The phalanges appear intact. There is moderate narrowing of the interphalangeal and metacarpophalangeal joints. There is moderate degenerative narrowing of the first [facility] joint as well as of the articulation of the  scaphoid with the trapezium and trapezoid. The contour of the scaphoid is irregular and may  indicate previous fracture. The radiocarpal joint is mildly narrowed. The previous distal radial metaphyseal fracture has healed. The ulna is intact. Joints IMPRESSION: No acute fracture or dislocation is observed. There is diffuse osteoarthritic joint space loss as described. Electronically Signed   By: David  SwazilandJordan M.D.   On: 08/10/2016 12:47      Medications:  Impres Principal Problem:   UTI (urinary tract infection) Active Problems:   Essential hypertension, benign   Severe aortic stenosis   CKD (chronic kidney disease), stage III   Syncope and collapse   Anemia   Closed fracture of multiple pubic rami, left, initial encounter (HCC)   Arthropathy of cervical spine (HCC)     Plan: Continue current therapy monitor renal function electrolytes continue Cipro for UTI will arrange home health care RN and physical therapy. Out of bed to chair 3 times a day today to see if dizziness ensues  Consultants:    Procedures   Antibiotics: Cipro         Time spent: 30 minutes   LOS: 2 days   Jaivon Vanbeek M   08/12/2016, 11:07 AM

## 2016-08-13 ENCOUNTER — Inpatient Hospital Stay (HOSPITAL_COMMUNITY): Payer: Medicare Other

## 2016-08-13 MED ORDER — CIPROFLOXACIN HCL 250 MG PO TABS
500.0000 mg | ORAL_TABLET | Freq: Every day | ORAL | Status: DC
  Administered 2016-08-13: 500 mg via ORAL
  Filled 2016-08-13: qty 2

## 2016-08-13 MED ORDER — CIPROFLOXACIN HCL 500 MG PO TABS: 500.0000 mg | ORAL_TABLET | Freq: Every day | ORAL | 0 refills | Status: DC

## 2016-08-13 MED ORDER — CIPROFLOXACIN HCL 500 MG PO TABS: 500.0000 mg | ORAL_TABLET | Freq: Two times a day (BID) | ORAL | 0 refills | Status: DC

## 2016-08-13 NOTE — Progress Notes (Addendum)
Prior to discharging the patient.  Patient c/o right knee pain to her family.  I was notified of the situation and on assessment I found that the knee is swollen.  Notified Dr. Janna Archondiego new orders given and followed.   Medications are late.  POC was to give the medication with discharge instructions.  Since that process was delayed I will give at this time.

## 2016-08-13 NOTE — Discharge Summary (Signed)
Physician Discharge Summary  Kristin Rubio ONG:[id number]:[id number] DOB: 12/25/1917 DOA: 08/10/2016  PCP: Kristin Rubio  Admit date: 08/10/2016 Discharge date: 08/13/2016   Recommendations for Outpatient Follow-up:  Patient is advised to take Cipro 500 mg by mouth twice a day for appeared to 5 days then discontinue. She is likewise scheduled follow-up with home health nurse RN as well as physical therapy for strengthening and ambulation. She likewise has old pubic ram I superior and inferior fractures which appear healed based on x-ray she'll follow-up with Dr. Carylon Perchesoy Rubio concerning all of the above as well as aortic stenosis and resolution of UTI. She likewise has a concomitant anemia which is long-standing presumably iron deficiency or chronic disease Discharge Diagnoses:  Principal Problem:   UTI (urinary tract infection) Active Problems:   Essential hypertension, benign   Severe aortic stenosis   CKD (chronic kidney disease), stage III   Syncope and collapse   Anemia   Closed fracture of multiple pubic rami, left, initial encounter Kristin Rubio(HCC)   Arthropathy of cervical spine [facility] Rubio(HCC)   Discharge Condition: Good  Filed Weights   08/10/16 2054 08/12/16 0604 08/13/16 0431  Weight: 74.3 kg (163 lb 11.2 oz) 75.7 kg (166 lb 14.2 oz) 76 kg (167 lb 9.6 oz)    History of present illness:  Patient is 83 year old white female who does live alone with several family support has severe aortic stenosis nonoperative chronic. She had a syncopal episode which she's had many and multiple falls in the past is admitted found to have concomitant UTI placed on Cipro IV 400 every 12 clinical resolution showed a hemoglobin of 8. Stable she had some diuresis which resolved any dyspnea orthopnea he was ambulated in the hallway with 4-point walker. He can chair the day before discharge did well. The day of discharge he was noticed that her x-ray of her pelvis revealed a nonacute superior new. Left pubic ram I fractures which  presumably old. I did consult with orthopedics prior to discharge we will felt that therapy would be weightbearing as tolerated and physical therapy. Orthopedic felt it was safe to discharge her from the pubic ram I fractures perspective she likewise was improved from a UTI and aortic stenosis for volume overload perspective and showed no signs of dizziness or syncope either sitting up or ambulating.  Rubio Course:  See history of present illness above  Procedures:    Consultations:  Orthopedics concerning nonacute superior and inferior ram pubic rami fractures.  Discharge Instructions  Discharge Instructions    Discharge instructions    Complete by:  As directed    Discharge patient    Complete by:  As directed    Discharge disposition:  01-Home or Self Care   Discharge patient date:  08/13/2016     Allergies as of 08/13/2016      Reactions   Amoxicillin Rash   Has patient had a PCN reaction causing immediate rash, facial/tongue/throat swelling, SOB or lightheadedness with hypotension: Yes Has patient had a PCN reaction causing severe rash involving mucus membranes or skin necrosis: No Has patient had a PCN reaction that required hospitalization: No Has patient had a PCN reaction occurring within the last 10 years: No If all of the above answers are "NO", then may proceed with Cephalosporin use.      Medication List    STOP taking these medications   OVER THE COUNTER MEDICATION     TAKE these medications   aspirin EC 81 MG tablet Take  1 tablet (81 mg total) by mouth daily.   ciprofloxacin 500 MG tablet Commonly known as:  CIPRO Take 1 tablet (500 mg total) by mouth 2 (two) times daily.   citalopram 10 MG tablet Commonly known as:  CELEXA Take 10 mg by mouth daily.   diltiazem 120 MG 24 hr capsule Commonly known as:  CARDIZEM CD TAKE (1) CAPSULE BY MOUTH ONCE DAILY.   ferrous sulfate 325 (65 FE) MG tablet Take 325 mg by mouth daily with breakfast.     hydrochlorothiazide 12.5 MG capsule Commonly known as:  MICROZIDE Take 12.5 mg by mouth daily.   ICAPS Caps Take 1 capsule by mouth daily.   isosorbide mononitrate 30 MG 24 hr tablet Commonly known as:  IMDUR Take 0.5 tablets (15 mg total) by mouth daily.   metoprolol succinate 100 MG 24 hr tablet Commonly known as:  TOPROL-XL Take 100 mg by mouth daily.   nitroGLYCERIN 0.4 MG SL tablet Commonly known as:  NITROSTAT Place 1 tablet (0.4 mg total) under the tongue every 5 (five) minutes as needed for chest pain.   pantoprazole 40 MG tablet Commonly known as:  PROTONIX Take 40 mg by mouth daily.      Allergies  Allergen Reactions  . Amoxicillin Rash    Has patient had a PCN reaction causing immediate rash, facial/tongue/throat swelling, SOB or lightheadedness with hypotension: Yes Has patient had a PCN reaction causing severe rash involving mucus membranes or skin necrosis: No Has patient had a PCN reaction that required hospitalization: No Has patient had a PCN reaction occurring within the last 10 years: No If all of the above answers are "NO", then may proceed with Cephalosporin use.       The results of significant diagnostics from this hospitalization (including imaging, microbiology, ancillary and laboratory) are listed below for reference.    Significant Diagnostic Studies: Dg Chest 2 View  Result Date: 08/10/2016 CLINICAL DATA:  Found on floor this morning, uncertain how patient fell, history essential benign hypertension, former smoker EXAM: CHEST  2 VIEW COMPARISON:  08/02/2015 FINDINGS: Upper normal heart size. Normal mediastinal contours and pulmonary vascularity. Bibasilar atelectasis and central peribronchial thickening. No acute infiltrate, pleural effusion or pneumothorax. Diffuse osseous demineralization with BILATERAL glenohumeral degenerative changes. IMPRESSION: Bronchitic changes with bibasilar atelectasis. Electronically Signed   By: Kristin Rubio M.D.    On: 08/10/2016 14:55   Dg Pelvis 1-2 Views  Result Date: 08/10/2016 CLINICAL DATA:  Found on floor this morning, uncertain of how she fell EXAM: PELVIS - 1-2 VIEW COMPARISON:  Lumbar spine radiographs 12/12/2010, 08/31/2008 FINDINGS: Severe osseous demineralization. Hip and SI joint spaces preserved. Fractures of the LEFT superior and inferior pubic rami, new since 2010 but of uncertain age. No additional fracture, dislocation or bone destruction. Degenerative disc and facet disease changes at visualized lower lumbar spine. IMPRESSION: Age-indeterminate LEFT superior and inferior pubic ramus fractures. Marked osseous demineralization. Electronically Signed   By: Kristin Rubio M.D.   On: 08/10/2016 15:06   Ct Head Wo Contrast  Result Date: 08/10/2016 CLINICAL DATA:  Unwitnessed fall.  Found on floor.  Confusion. EXAM: CT HEAD WITHOUT CONTRAST CT CERVICAL SPINE WITHOUT CONTRAST TECHNIQUE: Multidetector CT imaging of the head and cervical spine was performed following the standard protocol without intravenous contrast. Multiplanar CT image reconstructions of the cervical spine were also generated. COMPARISON:  Head CT 01/09/2016 FINDINGS: CT HEAD FINDINGS Brain: No evidence of acute hemorrhage, hydrocephalus, extra-axial collection or mass lesion/mass effect. Hypoattenuated appearance of  the pons. Advanced brain parenchymal volume loss and periventricular microangiopathy. Vascular: Calcific atherosclerotic disease at the skullbase. Skull: Normal. Negative for fracture or focal lesion. Sinuses/Orbits: Chronic ethmoid sinusitis, otherwise normal. Other: None. CT CERVICAL SPINE FINDINGS Alignment: 1-2 mm anterolisthesis of C4 on C5, C5 on C6 and C7 on T1, likely degenerative. Skull base and vertebrae: No acute fracture. No primary bone lesion or focal pathologic process. Soft tissues and spinal canal: No prevertebral fluid or swelling. No visible canal hematoma. Disc levels: Multilevel osteoarthritic changes with  disc space narrowing, endplate sclerosis and osteophytosis. Posterior facet arthropathy, moderate to advanced. Degenerative ankylosing of the lateral process of C4 and C5 on the left. Upper chest: Negative. Other: Calcific atherosclerotic disease of the carotid arteries. IMPRESSION: No evidence of acute traumatic injury to the head. Hypoattenuated appearance of the pons, nonspecific finding, as this part of the brain is prone to artifacts. If clinical suspicion for pontine infarct, then MRI of the head should be considered. Advanced brain parenchymal volume loss and periventricular microangiopathy. No evidence of acute traumatic injury to the cervical spine. Multilevel osteoarthritic changes, moderate to severe, with minimal anterolisthesis at several levels of the mid and lower cervical spine. Electronically Signed   By: Ted Mcalpineobrinka  Dimitrova M.D.   On: 08/10/2016 14:56   Ct Cervical Spine Wo Contrast  Result Date: 08/10/2016 CLINICAL DATA:  Unwitnessed fall.  Found on floor.  Confusion. EXAM: CT HEAD WITHOUT CONTRAST CT CERVICAL SPINE WITHOUT CONTRAST TECHNIQUE: Multidetector CT imaging of the head and cervical spine was performed following the standard protocol without intravenous contrast. Multiplanar CT image reconstructions of the cervical spine were also generated. COMPARISON:  Head CT 01/09/2016 FINDINGS: CT HEAD FINDINGS Brain: No evidence of acute hemorrhage, hydrocephalus, extra-axial collection or mass lesion/mass effect. Hypoattenuated appearance of the pons. Advanced brain parenchymal volume loss and periventricular microangiopathy. Vascular: Calcific atherosclerotic disease at the skullbase. Skull: Normal. Negative for fracture or focal lesion. Sinuses/Orbits: Chronic ethmoid sinusitis, otherwise normal. Other: None. CT CERVICAL SPINE FINDINGS Alignment: 1-2 mm anterolisthesis of C4 on C5, C5 on C6 and C7 on T1, likely degenerative. Skull base and vertebrae: No acute fracture. No primary bone lesion  or focal pathologic process. Soft tissues and spinal canal: No prevertebral fluid or swelling. No visible canal hematoma. Disc levels: Multilevel osteoarthritic changes with disc space narrowing, endplate sclerosis and osteophytosis. Posterior facet arthropathy, moderate to advanced. Degenerative ankylosing of the lateral process of C4 and C5 on the left. Upper chest: Negative. Other: Calcific atherosclerotic disease of the carotid arteries. IMPRESSION: No evidence of acute traumatic injury to the head. Hypoattenuated appearance of the pons, nonspecific finding, as this part of the brain is prone to artifacts. If clinical suspicion for pontine infarct, then MRI of the head should be considered. Advanced brain parenchymal volume loss and periventricular microangiopathy. No evidence of acute traumatic injury to the cervical spine. Multilevel osteoarthritic changes, moderate to severe, with minimal anterolisthesis at several levels of the mid and lower cervical spine. Electronically Signed   By: Ted Mcalpineobrinka  Dimitrova M.D.   On: 08/10/2016 14:56   Dg Hand Complete Left  Result Date: 08/10/2016 CLINICAL DATA:  Patient was found on the floor this morning but caregiver. Bruising of the left hand was noted. EXAM: LEFT HAND - COMPLETE 3+ VIEW COMPARISON:  Right wrist series of May 20, 2007 FINDINGS: The bones are subjectively osteopenic. The phalanges appear intact. There is moderate narrowing of the interphalangeal and metacarpophalangeal joints. There is moderate degenerative narrowing of the first [facility]  joint as well as of the articulation of the scaphoid with the trapezium and trapezoid. The contour of the scaphoid is irregular and may indicate previous fracture. The radiocarpal joint is mildly narrowed. The previous distal radial metaphyseal fracture has healed. The ulna is intact. Joints IMPRESSION: No acute fracture or dislocation is observed. There is diffuse osteoarthritic joint space loss as described.  Electronically Signed   By: David  [place] M.D.   On: 08/10/2016 12:47    Microbiology: Recent Results (from the past 240 hour(s))  Urine Culture     Status: Abnormal   Collection Time: 08/10/16 12:18 PM  Result Value Ref Range Status   Specimen Description URINE, CLEAN CATCH  Final   Special Requests NONE  Final   Culture MULTIPLE SPECIES PRESENT, SUGGEST RECOLLECTION (A)  Final   Report Status 08/12/2016 FINAL  Final     Labs: Basic Metabolic Panel:  Recent Labs Lab 08/10/16 1225 08/11/16 0459  NA 137 137  K 3.4* 3.7  CL 100* 103  CO2 28 27  GLUCOSE 155* 90  BUN 20 18  CREATININE 1.46* 1.26*  CALCIUM 8.3* 7.6*   Liver Function Tests:  Recent Labs Lab 08/10/16 1331  AST 25  ALT 12*  ALKPHOS 61  BILITOT 0.9  PROT 5.8*  ALBUMIN 2.7*   No results for input(s): LIPASE, AMYLASE in the last 168 hours. No results for input(s): AMMONIA in the last 168 hours. CBC:  Recent Labs Lab 08/10/16 1225 08/11/16 0459  WBC 9.3 7.6  NEUTROABS 8.1*  --   HGB 10.0* 8.7*  HCT 32.2* 28.3*  MCV 89.4 87.3  PLT 218 204   Cardiac Enzymes:  Recent Labs Lab 08/10/16 1331  TROPONINI <0.03   BNP: BNP (last 3 results) No results for input(s): BNP in the last 8760 hours.  ProBNP (last 3 results) No results for input(s): PROBNP in the last 8760 hours.  CBG:  Recent Labs Lab 08/11/16 1839  GLUCAP 78       Signed:  Abed Schar M  [facility] Pager: [phone] 08/13/2016, 8:32 AM

## 2016-08-13 NOTE — Progress Notes (Signed)
Patient discharged with instructions, prescription, and care notes.  Verbalized understanding via teach back.  IV was removed and the site was WNL. Patient voiced no further complaints or concerns at the time of discharge.  Appointments scheduled per instructions.  Patient left the floor via w/c family  And staff in stable condition. 

## 2016-08-13 NOTE — Care Management Note (Signed)
Case Management Note  Patient Details  Name: Kristin NordmannSara O Rubio MRN: [id number] Date of Birth: 10/11/1917  Subjective/Objective: 83 y.o. F [facility]. Caregiver of 6 yrs, Kristin ScarletRobin Rubio shares that pt [facility] the [facility] and CM will resume at this time. Kristin BallRobin will share this with family. No DME required.                    Action/Plan: CM will sign off for now but will be available should additional discharge needs arise or disposition change.    Expected Discharge Date:  08/13/16               Expected Discharge Plan:  Home w Home Health Services  In-House Referral:  NA  Discharge planning Services  CM Consult  Post Acute Care Choice:  Home Health Choice offered to:   (Caregiver at Bedside Kristin Rubio( Kristin Rubio))  DME Arranged:  N/A DME Agency:     HH Arranged:  RN, PT HH Agency:  [facility]  Status of Service:  Completed, signed off  If discussed at Long Length of Stay Meetings, dates discussed:    Additional Comments:  Yvone NeuCrutchfield, Tamiyah Moulin M, RN 08/13/2016, 1:37 PM

## 2016-08-13 NOTE — Progress Notes (Signed)
Patient ID: Kristin NordmannSara O Rubio, female   DOB: 05/13/1917, 83 y.o.   MRN: [id number] Asked to review patient for acute fracture left pelvis   H/o recent fall  xrays show what I believe to be old sup pubic ramus fracture   She s wallking without pain   I spoke to her and her son   No treatment needed

## 2016-08-17 ENCOUNTER — Other Ambulatory Visit (HOSPITAL_COMMUNITY): Payer: Self-pay | Admitting: Internal Medicine

## 2016-08-17 ENCOUNTER — Ambulatory Visit (HOSPITAL_COMMUNITY)
Admission: RE | Admit: 2016-08-17 | Discharge: 2016-08-17 | Disposition: A | Discharge status: Home / Self Care | Payer: Medicare Other | Source: Ambulatory Visit | Attending: Internal Medicine | Admitting: Internal Medicine

## 2016-08-17 DIAGNOSIS — M25462 Effusion, left knee: Secondary | ICD-10-CM | POA: Diagnosis not present

## 2016-08-17 DIAGNOSIS — R52 Pain, unspecified: Secondary | ICD-10-CM

## 2016-08-17 DIAGNOSIS — M25562 Pain in left knee: Secondary | ICD-10-CM | POA: Insufficient documentation

## 2016-08-17 DIAGNOSIS — M11262 Other chondrocalcinosis, left knee: Secondary | ICD-10-CM | POA: Diagnosis not present

## 2016-08-31 ENCOUNTER — Other Ambulatory Visit (HOSPITAL_COMMUNITY): Payer: Self-pay | Admitting: Internal Medicine

## 2016-08-31 ENCOUNTER — Ambulatory Visit (HOSPITAL_COMMUNITY)
Admission: RE | Admit: 2016-08-31 | Discharge: 2016-08-31 | Disposition: A | Discharge status: Home / Self Care | Payer: Medicare Other | Source: Ambulatory Visit | Attending: Internal Medicine | Admitting: Internal Medicine

## 2016-08-31 DIAGNOSIS — I82432 Acute embolism and thrombosis of left popliteal vein: Secondary | ICD-10-CM | POA: Insufficient documentation

## 2016-08-31 DIAGNOSIS — M79605 Pain in left leg: Secondary | ICD-10-CM

## 2016-08-31 DIAGNOSIS — M7122 Synovial cyst of popliteal space [Baker], left knee: Secondary | ICD-10-CM | POA: Diagnosis not present

## 2016-08-31 DIAGNOSIS — I82412 Acute embolism and thrombosis of left femoral vein: Secondary | ICD-10-CM | POA: Diagnosis not present

## 2016-09-26 ENCOUNTER — Ambulatory Visit: Payer: Medicare Other | Admitting: Cardiology

## 2016-10-09 NOTE — Progress Notes (Signed)
Cardiology Office Note  Date: 10/10/2016   ID: [name], DOB May 15, 1917, MRN [id number]  PCP: Asencion Noble, MD  Primary Cardiologist: Rozann Lesches, MD   Chief Complaint  Patient presents with  . Aortic Stenosis    History of Present Illness: Kristin Rubio is a 83 y.o. female last seen in May. Reviewed interval records, she was hospitalized back in July after fall. She has been having recurring falls at home, diagnosed with UTI, also anemia. She was noted by x-ray to have age-indeterminate left superior and inferior pubic ramus fractures and significant osseous demineralization. In August she was diagnosed with an extensive DVT involving the left common femoral, femoral and popliteal veins and she has been treated without was by Dr. Willey Blade.  She is here today for a follow-up visit. Mobility is significantly limited, she is in a wheelchair. She has someone stay with her in her apartment 24 hours a day. She does not report any palpitations or progressive chest pain on current regimen. She states that her left leg swelling has been slowly improving, although she is still edematous.  Past Medical History:  Diagnosis Date  . Anxiety   . Cardiac murmur 02/02/2014  . Chest pain with moderate risk of acute coronary syndrome 08/02/2015  . CKD (chronic kidney disease), stage III 08/19/2015  . Essential hypertension, benign   . GERD (gastroesophageal reflux disease)   . History of DVT (deep vein thrombosis)   . Lumbar disc disease   . Macular degeneration   . Osteoporosis   . Osteoporosis 09/29/2011  . Palpitations   . Renal insufficiency   . Severe aortic stenosis 08/19/2015  . Uterine prolapse     Past Surgical History:  Procedure Laterality Date  . CERVICAL POLYPECTOMY     Excision of interest cervical/ endometrial lesion  . CHOLECYSTECTOMY    . FINGER GANGLION CYST EXCISION     Mucoid cyst, left middle finger  . KNEE ARTHROSCOPY     Right  . VITRECTOMY     Partial, left eye     Current Outpatient Prescriptions  Medication Sig Dispense Refill  . citalopram (CELEXA) 10 MG tablet Take 10 mg by mouth daily.    Marland Kitchen diltiazem (CARDIZEM CD) 120 MG 24 hr capsule TAKE (1) CAPSULE BY MOUTH ONCE DAILY. 30 capsule 6  . ELIQUIS 5 MG TABS tablet Take 5 mg by mouth 2 (two) times daily.    . ferrous sulfate 325 (65 FE) MG tablet Take 325 mg by mouth daily with breakfast.    . hydrochlorothiazide (MICROZIDE) 12.5 MG capsule Take 12.5 mg by mouth daily.     . isosorbide mononitrate (IMDUR) 30 MG 24 hr tablet Take 0.5 tablets (15 mg total) by mouth daily. 30 tablet 12  . metoprolol (TOPROL-XL) 100 MG 24 hr tablet Take 100 mg by mouth daily.      . Multiple Vitamins-Minerals (ICAPS) CAPS Take 1 capsule by mouth daily.      . nitroGLYCERIN (NITROSTAT) 0.4 MG SL tablet Place 1 tablet (0.4 mg total) under the tongue every 5 (five) minutes as needed for chest pain. 25 tablet 12  . pantoprazole (PROTONIX) 40 MG tablet Take 40 mg by mouth daily.       No current facility-administered medications for this visit.    Allergies:  Amoxicillin   Social History: The patient  reports that she quit smoking about 65 years ago. Her smoking use included Cigarettes. She started smoking about 85 years ago. She smoked  0.10 packs per day. She has never used smokeless tobacco. She reports that she does not drink alcohol or use drugs.   ROS:  Please see the history of present illness. Otherwise, complete review of systems is positive for hearing loss.  All other systems are reviewed and negative.   Physical Exam: VS:  BP 136/62   Pulse 69   Ht 5' (1.524 m)   Wt 163 lb (73.9 kg)   SpO2 95%   BMI 31.83 kg/m , BMI Body mass index is 31.83 kg/m.  Wt Readings from Last 3 Encounters:  10/10/16 163 lb (73.9 kg)  08/13/16 167 lb 9.6 oz (76 kg)  05/24/16 160 lb (72.6 kg)    General: Elderly woman, seated in wheelchair. HEENT: Conjunctiva and lids normal, oropharynx clear. Neck: Supple, no elevated  JVP, no thyromegaly. Lungs: Clear to auscultation, nonlabored breathing at rest. Cardiac: Regular rate and rhythm, no S3, 1-2/7 systolic murmur, no pericardial rub. Abdomen: Soft, nontender, bowel sounds present. Extremities: Bilateral lower leg edema, left greater than right. Skin: Warm and dry. Musculoskeletal: Significant kyphosis. Neuropsychiatric: Alert and oriented x3, affect grossly appropriate.  ECG: I personally reviewed the tracing from 7/20 07/05/2016 which showed sinus rhythm with left bundle branch block.  Recent Labwork: 08/10/2016: ALT 12; AST 25 08/11/2016: BUN 18; Creatinine, Ser 1.26; Hemoglobin 8.7; Platelets 204; Potassium 3.7; Sodium 137; TSH 0.746   Other Studies Reviewed Today:  Echocardiogram 08/03/2015: Study Conclusions  - Left ventricle: The cavity size was normal. Wall thickness was   increased in a pattern of mild LVH. Systolic function was normal.   The estimated ejection fraction was in the range of 55% to 60%.   Wall motion was normal; there were no regional wall motion   abnormalities. Doppler parameters are consistent with abnormal   left ventricular relaxation (grade 1 diastolic dysfunction).   Doppler parameters are consistent with high ventricular filling   pressure. - Aortic valve: There was severe stenosis. Mean gradient (S): 34 mm   Hg. Peak gradient (S): 62 mm Hg. VTI ratio of LVOT to aortic   valve: 0.28. Valve area (VTI): 0.79 cm^2. Valve area (Vmax): 0.79   cm^2. - Mitral valve: Calcified annulus. Mildly calcified leaflets .   There was trivial regurgitation. - Left atrium: The atrium was mildly dilated. - Right atrium: Central venous pressure (est): 8 mm Hg. - Tricuspid valve: There was physiologic regurgitation. - Pulmonary arteries: Systolic pressure could not be accurately   estimated. - Pericardium, extracardiac: There was no pericardial effusion.  Impressions:  - Mild LVH with LVEF 55-60%. Grade 1 diastolic dysfunction  with   increased LV filling pressure. Mild left atrial enlargement. MAC   with mildly calcified mitral leaflets and trivial mitral   regurgitation. Severe calcific aortic stenosis as outlined above.  Assessment and Plan:  1. Severe aortic stenosis, being managed conservatively without plan to pursue TAVR at this point.  2. Ischemic heart disease based on previous noninvasive workup, also being managed conservatively. On medical therapy she does not report progressive angina symptoms.  3. Left leg DVT diagnosed by Dr. Willey Blade, currently on Eliquis.  Current medicines were reviewed with the patient today.  Disposition: Follow-up in 4 months.  Signed, Satira Sark, MD, [facility] 10/10/2016 11:49 AM    [facility] [place]. [address] [place], [place] [address] Phone: [phone]; Fax: [phone]

## 2016-10-10 ENCOUNTER — Encounter: Payer: Self-pay | Admitting: Cardiology

## 2016-10-10 ENCOUNTER — Ambulatory Visit (INDEPENDENT_AMBULATORY_CARE_PROVIDER_SITE_OTHER): Payer: Medicare Other | Admitting: Cardiology

## 2016-10-10 VITALS — BP 136/62 | HR 69 | Ht 60.0 in | Wt 163.0 lb

## 2016-10-10 DIAGNOSIS — I350 Nonrheumatic aortic (valve) stenosis: Secondary | ICD-10-CM

## 2016-10-10 DIAGNOSIS — I259 Chronic ischemic heart disease, unspecified: Secondary | ICD-10-CM

## 2016-10-10 DIAGNOSIS — I82412 Acute embolism and thrombosis of left femoral vein: Secondary | ICD-10-CM | POA: Diagnosis not present

## 2016-10-10 NOTE — Patient Instructions (Signed)
Your physician wants you to follow-up in: 4 months with Dr.McDowell You will receive a reminder letter in the mail two months in advance. If you don't receive a letter, please call our office to schedule the follow-up appointment.    Your physician recommends that you continue on your current medications as directed. Please refer to the Current Medication list given to you today.    If you need a refill on your cardiac medications before your next appointment, please call your pharmacy.     No lab work or tests ordered today.        Thank you for choosing  [facility] !        

## 2016-10-18 ENCOUNTER — Ambulatory Visit (HOSPITAL_COMMUNITY): Payer: Medicare Other

## 2016-12-12 ENCOUNTER — Other Ambulatory Visit: Payer: Self-pay | Admitting: Cardiology

## 2016-12-25 IMAGING — CR DG CHEST 1V PORT
1 series · 1 of 1 positions shown · non-contrast
Comparison: 05/14/2013, 12/12/2010

CLINICAL DATA: Chest pain.

EXAM:
PORTABLE CHEST 1 VIEW

[ap portable]
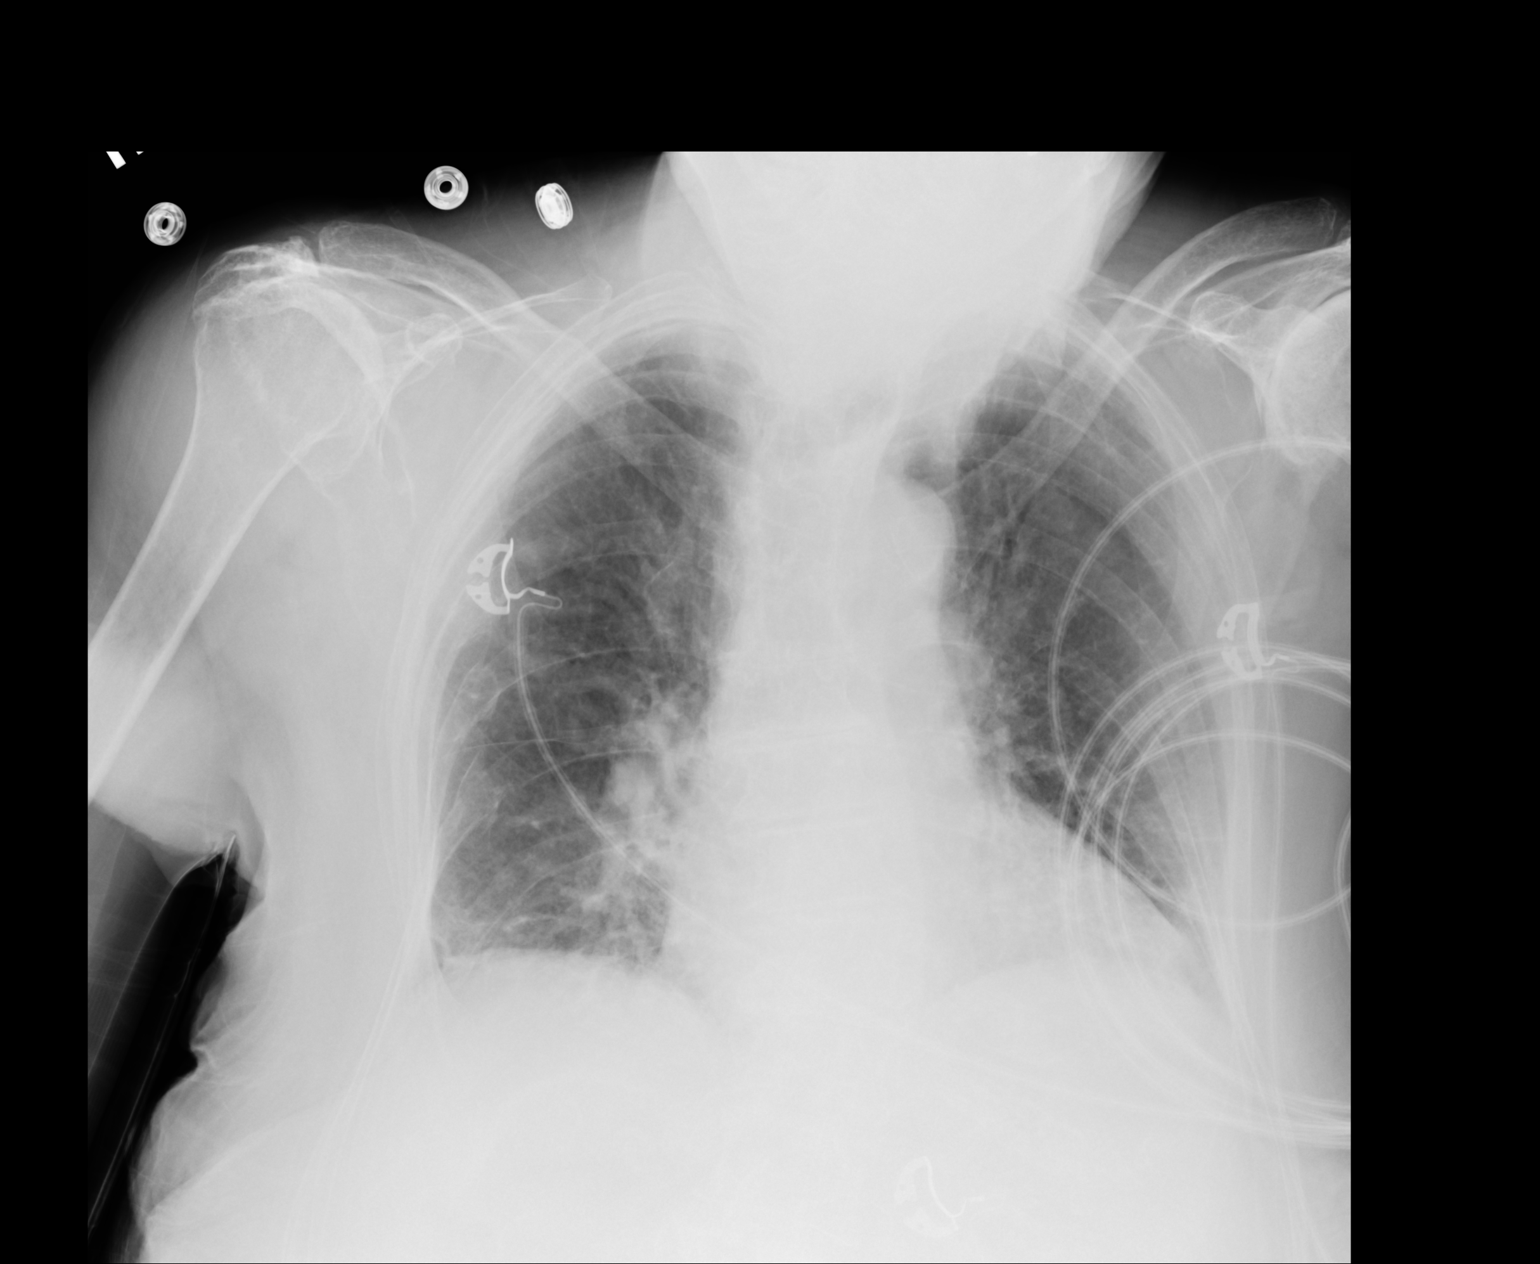

[1 of 1 positions shown; findings below may reference images not displayed]

FINDINGS: Lung volumes are low. Mild cardiomegaly, similar to prior exam.
Perihilar bronchial thickening appears chronic. There is increased
right infrahilar atelectasis. Possible vascular congestion versus
bronchovascular crowding. No large pleural effusion or pneumothorax.
Remote right rib fractures. Chronic change about both shoulders.
IMPRESSION: Low lung volumes. Stable cardiomegaly and chronic bronchial
thickening. Vascular congestion versus bronchovascular crowding.

## 2017-01-10 ENCOUNTER — Other Ambulatory Visit: Payer: Self-pay | Admitting: Cardiology

## 2017-02-06 NOTE — Progress Notes (Signed)
Cardiology Office Note  Date: 02/08/2017   ID: Kristin Rubio, DOB 10-08-1917, MRN [id number]  PCP: Asencion Noble, MD  Primary Cardiologist: Rozann Lesches, MD   Chief Complaint  Patient presents with  . Aortic Stenosis    History of Present Illness: Kristin Rubio is a 84 y.o. female last seen in September 2018. She presents today for a follow-up visit. She does not report any chest pain or palpitations since last encounter, no interval hospitalizations. She did get dispense some time with family over the holidays.  She remains functionally limited, using a wheelchair today. No reported falls. She is due to see Dr. Willey Blade next week for a routine check.  I reviewed her medications.there have been no significant changes since last assessment. She continues on Eliquis, Cardizem CD, Imdur, and Toprol-XL.  Past Medical History:  Diagnosis Date  . Anxiety   . Cardiac murmur 02/02/2014  . Chest pain with moderate risk of acute coronary syndrome 08/02/2015  . CKD (chronic kidney disease), stage III ([place]) 08/19/2015  . Essential hypertension, benign   . GERD (gastroesophageal reflux disease)   . History of DVT (deep vein thrombosis)   . Lumbar disc disease   . Macular degeneration   . Osteoporosis   . Osteoporosis 09/29/2011  . Palpitations   . Renal insufficiency   . Severe aortic stenosis 08/19/2015  . Uterine prolapse     Past Surgical History:  Procedure Laterality Date  . CERVICAL POLYPECTOMY     Excision of interest cervical/ endometrial lesion  . CHOLECYSTECTOMY    . FINGER GANGLION CYST EXCISION     Mucoid cyst, left middle finger  . KNEE ARTHROSCOPY     Right  . VITRECTOMY     Partial, left eye    Current Outpatient Medications  Medication Sig Dispense Refill  . citalopram (CELEXA) 10 MG tablet Take 10 mg by mouth daily.    Marland Kitchen diltiazem (CARDIZEM CD) 120 MG 24 hr capsule TAKE (1) CAPSULE BY MOUTH ONCE DAILY. 30 capsule 6  . ELIQUIS 5 MG TABS tablet Take 5 mg by mouth 2  (two) times daily.    . ferrous sulfate 325 (65 FE) MG tablet Take 325 mg by mouth daily with breakfast.    . hydrochlorothiazide (MICROZIDE) 12.5 MG capsule Take 12.5 mg by mouth daily.     . isosorbide mononitrate (IMDUR) 30 MG 24 hr tablet Take 0.5 tablets (15 mg total) by mouth daily. 30 tablet 12  . metoprolol (TOPROL-XL) 100 MG 24 hr tablet Take 100 mg by mouth daily.      . Multiple Vitamins-Minerals (ICAPS) CAPS Take 1 capsule by mouth daily.      . nitroGLYCERIN (NITROSTAT) 0.4 MG SL tablet Place 1 tablet (0.4 mg total) under the tongue every 5 (five) minutes as needed for chest pain. 25 tablet 12  . pantoprazole (PROTONIX) 40 MG tablet Take 40 mg by mouth daily.       No current facility-administered medications for this visit.    Allergies:  Amoxicillin   Social History: The patient  reports that she quit smoking about 66 years ago. Her smoking use included cigarettes. She started smoking about 86 years ago. She smoked 0.10 packs per day. she has never used smokeless tobacco. She reports that she does not drink alcohol or use drugs.   ROS:  Please see the history of present illness. Otherwise, complete review of systems is positive for hearing loss, arthritis symptoms.  All other systems are  reviewed and negative.   Physical Exam: VS:  BP 122/68 (BP Location: Right Arm)   Pulse 88   Ht 5' (1.524 m)   Wt 162 lb (73.5 kg)   SpO2 97%   BMI 31.64 kg/m , BMI Body mass index is 31.64 kg/m.  Wt Readings from Last 3 Encounters:  02/08/17 162 lb (73.5 kg)  10/10/16 163 lb (73.9 kg)  08/13/16 167 lb 9.6 oz (76 kg)    General: Frail elderly woman, seated in wheelchair. HEENT: Conjunctiva and lids normal, oropharynx clear. Neck: Supple, no elevated JVP or carotid bruits, no thyromegaly. Lungs: Clear to auscultation, nonlabored breathing at rest. Cardiac: Regular rate and rhythm, no S3, 3/6 systolic murmur with diminished second heart sound, no pericardial rub. Abdomen: Soft,  nontender, bowel sounds present. Extremities: Stable appearing lower leg edema, distal pulses 2+. Skin: Warm and dry. Musculoskeletal: Pronounced kyphosis. Neuropsychiatric: Alert and oriented x3, affect grossly appropriate.  ECG: I personally reviewed the tracing from 7/20 07/05/2016 which showed sinus rhythm with left bundle branch block.  Recent Labwork: 08/10/2016: ALT 12; AST 25 08/11/2016: BUN 18; Creatinine, Ser 1.26; Hemoglobin 8.7; Platelets 204; Potassium 3.7; Sodium 137; TSH 0.746   Other Studies Reviewed Today:  Echocardiogram 08/03/2015: Study Conclusions  - Left ventricle: The cavity size was normal. Wall thickness was   increased in a pattern of mild LVH. Systolic function was normal.   The estimated ejection fraction was in the range of 55% to 60%.   Wall motion was normal; there were no regional wall motion   abnormalities. Doppler parameters are consistent with abnormal   left ventricular relaxation (grade 1 diastolic dysfunction).   Doppler parameters are consistent with high ventricular filling   pressure. - Aortic valve: There was severe stenosis. Mean gradient (S): 34 mm   Hg. Peak gradient (S): 62 mm Hg. VTI ratio of LVOT to aortic   valve: 0.28. Valve area (VTI): 0.79 cm^2. Valve area (Vmax): 0.79   cm^2. - Mitral valve: Calcified annulus. Mildly calcified leaflets .   There was trivial regurgitation. - Left atrium: The atrium was mildly dilated. - Right atrium: Central venous pressure (est): 8 mm Hg. - Tricuspid valve: There was physiologic regurgitation. - Pulmonary arteries: Systolic pressure could not be accurately   estimated. - Pericardium, extracardiac: There was no pericardial effusion.  Impressions:  - Mild LVH with LVEF 55-60%. Grade 1 diastolic dysfunction with   increased LV filling pressure. Mild left atrial enlargement. MAC   with mildly calcified mitral leaflets and trivial mitral   regurgitation. Severe calcific aortic stenosis as  outlined above.  Assessment and Plan:  1. Severe aortic stenosis, clinically stable at this point. Continue with conservative management, she does not want to pursue invasive workup or TAVR.  2. Ischemic heart disease being managed medically. No active angina symptoms.  3. History of left leg DVT, she has continued on Eliquis per Dr. Willey Blade.  Current medicines were reviewed with the patient today.  Disposition: Follow-up in 4 months.   Signed, Satira Sark, MD, [facility] 02/08/2017 11:48 AM    [facility] at [place]. [address] [place], [place] [address] Phone: [phone]; Fax: [phone]

## 2017-02-08 ENCOUNTER — Encounter: Payer: Self-pay | Admitting: Cardiology

## 2017-02-08 ENCOUNTER — Ambulatory Visit: Payer: Medicare Other | Admitting: Cardiology

## 2017-02-08 VITALS — BP 122/68 | HR 88 | Ht 60.0 in | Wt 162.0 lb

## 2017-02-08 DIAGNOSIS — I82412 Acute embolism and thrombosis of left femoral vein: Secondary | ICD-10-CM

## 2017-02-08 DIAGNOSIS — I350 Nonrheumatic aortic (valve) stenosis: Secondary | ICD-10-CM | POA: Diagnosis not present

## 2017-02-08 DIAGNOSIS — I259 Chronic ischemic heart disease, unspecified: Secondary | ICD-10-CM

## 2017-02-08 NOTE — Patient Instructions (Signed)
Your physician recommends that you schedule a follow-up appointment in: 4 months with Dr.McDowell    Your physician recommends that you continue on your current medications as directed. Please refer to the Current Medication list given to you today.    If you need a refill on your cardiac medications before your next appointment, please call your pharmacy.     No tests or lab work ordered today.       Thank you for choosing [facility] !

## 2017-02-09 ENCOUNTER — Other Ambulatory Visit: Payer: Self-pay | Admitting: Cardiology

## 2017-06-15 NOTE — Progress Notes (Deleted)
Cardiology Office Note  Date: 06/15/2017   ID: RICKEY FARRIER, DOB 1917-12-30, MRN [id number]  PCP: Asencion Noble, MD  Primary Cardiologist: Rozann Lesches, MD   No chief complaint on file.   History of Present Illness: ADEL NEYER is a 84 y.o. female last seen in January.  Past Medical History:  Diagnosis Date  . Anxiety   . Cardiac murmur 02/02/2014  . Chest pain with moderate risk of acute coronary syndrome 08/02/2015  . CKD (chronic kidney disease), stage III ([place]) 08/19/2015  . Essential hypertension, benign   . GERD (gastroesophageal reflux disease)   . History of DVT (deep vein thrombosis)   . Lumbar disc disease   . Macular degeneration   . Osteoporosis   . Osteoporosis 09/29/2011  . Palpitations   . Renal insufficiency   . Severe aortic stenosis 08/19/2015  . Uterine prolapse     Past Surgical History:  Procedure Laterality Date  . CERVICAL POLYPECTOMY     Excision of interest cervical/ endometrial lesion  . CHOLECYSTECTOMY    . FINGER GANGLION CYST EXCISION     Mucoid cyst, left middle finger  . KNEE ARTHROSCOPY     Right  . VITRECTOMY     Partial, left eye    Current Outpatient Medications  Medication Sig Dispense Refill  . citalopram (CELEXA) 10 MG tablet Take 10 mg by mouth daily.    Marland Kitchen diltiazem (CARDIZEM CD) 120 MG 24 hr capsule TAKE (1) CAPSULE BY MOUTH ONCE DAILY. 30 capsule 6  . diltiazem (CARDIZEM CD) 120 MG 24 hr capsule TAKE (1) CAPSULE BY MOUTH ONCE DAILY. 30 capsule 9  . ELIQUIS 5 MG TABS tablet Take 5 mg by mouth 2 (two) times daily.    . ferrous sulfate 325 (65 FE) MG tablet Take 325 mg by mouth daily with breakfast.    . hydrochlorothiazide (MICROZIDE) 12.5 MG capsule Take 12.5 mg by mouth daily.     . isosorbide mononitrate (IMDUR) 30 MG 24 hr tablet Take 0.5 tablets (15 mg total) by mouth daily. 30 tablet 12  . metoprolol (TOPROL-XL) 100 MG 24 hr tablet Take 100 mg by mouth daily.      . Multiple Vitamins-Minerals (ICAPS) CAPS Take 1 capsule  by mouth daily.      . nitroGLYCERIN (NITROSTAT) 0.4 MG SL tablet Place 1 tablet (0.4 mg total) under the tongue every 5 (five) minutes as needed for chest pain. 25 tablet 12  . pantoprazole (PROTONIX) 40 MG tablet Take 40 mg by mouth daily.       No current facility-administered medications for this visit.    Allergies:  Amoxicillin   Social History: The patient  reports that she quit smoking about 66 years ago. Her smoking use included cigarettes. She started smoking about 86 years ago. She smoked 0.10 packs per day. She has never used smokeless tobacco. She reports that she does not drink alcohol or use drugs.   Family History: The patient's family history includes Cancer in her paternal aunt; Stroke in her paternal uncle.   ROS:  Please see the history of present illness. Otherwise, complete review of systems is positive for {NONE DEFAULTED:18576::"none"}.  All other systems are reviewed and negative.   Physical Exam: VS:  There were no vitals taken for this visit., BMI There is no height or weight on file to calculate BMI.  Wt Readings from Last 3 Encounters:  02/08/17 162 lb (73.5 kg)  10/10/16 163 lb (73.9 kg)  08/13/16  167 lb 9.6 oz (76 kg)    General: Patient appears comfortable at rest. HEENT: Conjunctiva and lids normal, oropharynx clear with moist mucosa. Neck: Supple, no elevated JVP or carotid bruits, no thyromegaly. Lungs: Clear to auscultation, nonlabored breathing at rest. Cardiac: Regular rate and rhythm, no S3 or significant systolic murmur, no pericardial rub. Abdomen: Soft, nontender, no hepatomegaly, bowel sounds present, no guarding or rebound. Extremities: No pitting edema, distal pulses 2+. Skin: Warm and dry. Musculoskeletal: No kyphosis. Neuropsychiatric: Alert and oriented x3, affect grossly appropriate.  ECG: I personally reviewed the tracing from 08/11/2016 which showed sinus rhythm with left bundle branch block.  Recent Labwork: 08/10/2016: ALT 12; AST  25 08/11/2016: BUN 18; Creatinine, Ser 1.26; Hemoglobin 8.7; Platelets 204; Potassium 3.7; Sodium 137; TSH 0.746   Other Studies Reviewed Today:  Echocardiogram 08/03/2015: Study Conclusions  - Left ventricle: The cavity size was normal. Wall thickness was increased in a pattern of mild LVH. Systolic function was normal. The estimated ejection fraction was in the range of 55% to 60%. Wall motion was normal; there were no regional wall motion abnormalities. Doppler parameters are consistent with abnormal left ventricular relaxation (grade 1 diastolic dysfunction). Doppler parameters are consistent with high ventricular filling pressure. - Aortic valve: There was severe stenosis. Mean gradient (S): 34 mm Hg. Peak gradient (S): 62 mm Hg. VTI ratio of LVOT to aortic valve: 0.28. Valve area (VTI): 0.79 cm^2. Valve area (Vmax): 0.79 cm^2. - Mitral valve: Calcified annulus. Mildly calcified leaflets . There was trivial regurgitation. - Left atrium: The atrium was mildly dilated. - Right atrium: Central venous pressure (est): 8 mm Hg. - Tricuspid valve: There was physiologic regurgitation. - Pulmonary arteries: Systolic pressure could not be accurately estimated. - Pericardium, extracardiac: There was no pericardial effusion.  Impressions:  - Mild LVH with LVEF 55-60%. Grade 1 diastolic dysfunction with increased LV filling pressure. Mild left atrial enlargement. MAC with mildly calcified mitral leaflets and trivial mitral regurgitation. Severe calcific aortic stenosis as outlined above.  Assessment and Plan:   Current medicines were reviewed with the patient today.  No orders of the defined types were placed in this encounter.   Disposition:  Signed, Satira Sark, MD, [facility] 06/15/2017 6:48 PM    [place] at [facility] [address] [place], [place] [address] Phone: [phone]; Fax: [phone]

## 2017-06-18 ENCOUNTER — Ambulatory Visit: Payer: Medicare Other | Admitting: Cardiology

## 2017-07-16 ENCOUNTER — Other Ambulatory Visit: Payer: Self-pay

## 2017-07-16 ENCOUNTER — Encounter (HOSPITAL_COMMUNITY): Payer: Self-pay | Admitting: Emergency Medicine

## 2017-07-16 ENCOUNTER — Emergency Department (HOSPITAL_COMMUNITY): Payer: Medicare Other

## 2017-07-16 ENCOUNTER — Emergency Department (HOSPITAL_COMMUNITY)
Admission: EM | Admit: 2017-07-16 | Discharge: 2017-07-16 | Disposition: A | Discharge status: Home / Self Care | Payer: Medicare Other | Attending: Emergency Medicine | Admitting: Emergency Medicine

## 2017-07-16 DIAGNOSIS — I10 Essential (primary) hypertension: Secondary | ICD-10-CM | POA: Diagnosis not present

## 2017-07-16 DIAGNOSIS — Z7901 Long term (current) use of anticoagulants: Secondary | ICD-10-CM | POA: Insufficient documentation

## 2017-07-16 DIAGNOSIS — Z87891 Personal history of nicotine dependence: Secondary | ICD-10-CM | POA: Insufficient documentation

## 2017-07-16 DIAGNOSIS — R55 Syncope and collapse: Secondary | ICD-10-CM | POA: Insufficient documentation

## 2017-07-16 DIAGNOSIS — F0391 Unspecified dementia with behavioral disturbance: Secondary | ICD-10-CM

## 2017-07-16 DIAGNOSIS — Z79899 Other long term (current) drug therapy: Secondary | ICD-10-CM | POA: Insufficient documentation

## 2017-07-16 DIAGNOSIS — R079 Chest pain, unspecified: Secondary | ICD-10-CM

## 2017-07-16 DIAGNOSIS — I5030 Unspecified diastolic (congestive) heart failure: Secondary | ICD-10-CM | POA: Insufficient documentation

## 2017-07-16 DIAGNOSIS — I130 Hypertensive heart and chronic kidney disease with heart failure and stage 1 through stage 4 chronic kidney disease, or unspecified chronic kidney disease: Secondary | ICD-10-CM | POA: Diagnosis not present

## 2017-07-16 DIAGNOSIS — R110 Nausea: Secondary | ICD-10-CM | POA: Insufficient documentation

## 2017-07-16 DIAGNOSIS — R627 Adult failure to thrive: Secondary | ICD-10-CM | POA: Diagnosis not present

## 2017-07-16 DIAGNOSIS — F0390 Unspecified dementia without behavioral disturbance: Secondary | ICD-10-CM | POA: Diagnosis not present

## 2017-07-16 DIAGNOSIS — R072 Precordial pain: Secondary | ICD-10-CM | POA: Insufficient documentation

## 2017-07-16 DIAGNOSIS — K219 Gastro-esophageal reflux disease without esophagitis: Secondary | ICD-10-CM

## 2017-07-16 DIAGNOSIS — I350 Nonrheumatic aortic (valve) stenosis: Secondary | ICD-10-CM

## 2017-07-16 DIAGNOSIS — N183 Chronic kidney disease, stage 3 (moderate): Secondary | ICD-10-CM | POA: Diagnosis not present

## 2017-07-16 LAB — CBC
HCT: 27.2 % — ABNORMAL LOW (ref 36.0–46.0)
Hemoglobin: 8.8 g/dL — ABNORMAL LOW (ref 12.0–15.0)
MCH: 32 pg (ref 26.0–34.0)
MCHC: 32.4 g/dL (ref 30.0–36.0)
MCV: 98.9 fL (ref 78.0–100.0)
PLATELETS: 113 10*3/uL — AB (ref 150–400)
RBC: 2.75 MIL/uL — AB (ref 3.87–5.11)
RDW: 14.5 % (ref 11.5–15.5)
WBC: 9.9 10*3/uL (ref 4.0–10.5)

## 2017-07-16 LAB — BASIC METABOLIC PANEL
Anion gap: 8 (ref 5–15)
BUN: 36 mg/dL — ABNORMAL HIGH (ref 8–23)
CO2: 28 mmol/L (ref 22–32)
Calcium: 8.4 mg/dL — ABNORMAL LOW (ref 8.9–10.3)
Chloride: 98 mmol/L (ref 98–111)
Creatinine, Ser: 1.5 mg/dL — ABNORMAL HIGH (ref 0.44–1.00)
GFR calc Af Amer: 32 mL/min — ABNORMAL LOW (ref >60)
GFR calc non Af Amer: 28 mL/min — ABNORMAL LOW (ref >60)
Glucose, Bld: 178 mg/dL — ABNORMAL HIGH (ref 70–99)
Potassium: 3.5 mmol/L (ref 3.5–5.1)
Sodium: 134 mmol/L — ABNORMAL LOW (ref 135–145)

## 2017-07-16 LAB — TROPONIN I: Troponin I: 0.28 ng/mL (ref <0.03)

## 2017-07-16 MED ORDER — ASPIRIN 81 MG PO CHEW
324.0000 mg | CHEWABLE_TABLET | Freq: Once | ORAL | Status: AC
  Administered 2017-07-16: 324 mg via ORAL
  Filled 2017-07-16: qty 4

## 2017-07-16 MED ORDER — SODIUM CHLORIDE 0.9 % IV BOLUS
500.0000 mL | Freq: Once | INTRAVENOUS | Status: AC
Start: 2017-07-16 — End: 2017-07-16
  Administered 2017-07-16: 500 mL via INTRAVENOUS

## 2017-07-16 MED ORDER — SODIUM CHLORIDE 0.9 % IV BOLUS
500.0000 mL | Freq: Once | INTRAVENOUS | Status: AC
  Administered 2017-07-16: 500 mL via INTRAVENOUS

## 2017-08-16 NOTE — ED Triage Notes (Signed)
Patient brought in by EMS for chest pain, patient pain free on arrival.

## 2017-08-16 NOTE — ED Provider Notes (Signed)
[facility] EMERGENCY DEPARTMENT Provider Note   CSN: [id number] Arrival date & time: Aug 02, 2017  1134     History   Chief Complaint Chief Complaint  Patient presents with  . Chest Pain    HPI Kristin Rubio is a 84 y.o. female.  84 year old female with history of dementia level 5 caveat.  She presents here with her healthcare attendant for evaluation of chest pain.  Per the attendant that she was getting her up for transfer with a mechanical lift she experienced substernal chest pain followed by a unresponsive episode that lasted about 1 minute.  The attendant says she looked very white but was breathing and eventually came to.  By the time EMS got there her chest pain had resolved.  When I talked her here in the emergency department she does not remember having chest pain or having had a syncopal event.  Per the attendant she is had baseline nausea for the last few months and has not been eating very much.  PCP is aware of this .  This and is been scheduling her some physical therapy or visiting nurse. patient denies any history of chest pain but on review of her chart she does have coronary disease and some valvular problems  The history is provided by the patient.  Chest Pain   This is a new problem. The current episode started 1 to 2 hours ago. The problem has been resolved. The pain is present in the substernal region. Associated symptoms include nausea and syncope. Pertinent negatives include no back pain, no cough, no fever, no headaches, no shortness of breath and no vomiting. She has tried rest for the symptoms. The treatment provided significant relief.  Her past medical history is significant for CAD and valve disorder.    Past Medical History:  Diagnosis Date  . Anxiety   . Cardiac murmur 02/02/2014  . Chest pain with moderate risk of acute coronary syndrome 08/02/2015  . CKD (chronic kidney disease), stage III ([place]) 08/19/2015  . Essential hypertension, benign   . GERD  (gastroesophageal reflux disease)   . History of DVT (deep vein thrombosis)   . Lumbar disc disease   . Macular degeneration   . Osteoporosis   . Osteoporosis 09/29/2011  . Palpitations   . Renal insufficiency   . Severe aortic stenosis 08/19/2015  . Uterine prolapse     Patient Active Problem List   Diagnosis Date Noted  . UTI (urinary tract infection) 08/10/2016  . Syncope and collapse 08/10/2016  . Anemia 08/10/2016  . Closed fracture of multiple pubic rami, left, initial encounter ([place]) 08/10/2016  . Arthropathy of cervical spine 08/10/2016  . Severe aortic stenosis 08/19/2015  . CKD (chronic kidney disease), stage III ([place]) 08/19/2015  . Chest pain with moderate risk of acute coronary syndrome 08/02/2015  . Cardiac murmur 02/02/2014  . Osteoporosis 09/29/2011  . Essential hypertension, benign 11/15/2009  . Palpitations 11/15/2009    Past Surgical History:  Procedure Laterality Date  . CERVICAL POLYPECTOMY     Excision of interest cervical/ endometrial lesion  . CHOLECYSTECTOMY    . FINGER GANGLION CYST EXCISION     Mucoid cyst, left middle finger  . KNEE ARTHROSCOPY     Right  . VITRECTOMY     Partial, left eye     OB History    Gravida  3   Para  3   Term  3   Preterm      AB  Living        SAB      TAB      Ectopic      Multiple      Live Births               Home Medications    Prior to Admission medications   Medication Sig Start Date End Date Taking? Authorizing Provider  citalopram (CELEXA) 10 MG tablet Take 10 mg by mouth daily.   Yes [provider]  diltiazem (CARDIZEM CD) 120 MG 24 hr capsule TAKE (1) CAPSULE BY MOUTH ONCE DAILY. 02/09/17  Yes Satira Sark, MD  ELIQUIS 5 MG TABS tablet Take 5 mg by mouth 2 (two) times daily. 09/08/16  Yes [provider]  ferrous sulfate 325 (65 FE) MG tablet Take 325 mg by mouth daily with breakfast.   Yes [provider]  hydrochlorothiazide (MICROZIDE)  12.5 MG capsule Take 12.5 mg by mouth daily.    Yes [provider]  isosorbide mononitrate (IMDUR) 30 MG 24 hr tablet Take 0.5 tablets (15 mg total) by mouth daily. 08/05/15  Yes Asencion Noble, MD  metoprolol (TOPROL-XL) 100 MG 24 hr tablet Take 50 mg by mouth daily.    Yes [provider]  ondansetron (ZOFRAN) 4 MG tablet Take 1 tablet by mouth 3 (three) times daily as needed. 07/15/17  Yes [provider]  pantoprazole (PROTONIX) 40 MG tablet Take 40 mg by mouth daily.     Yes [provider]  potassium chloride (K-DUR,KLOR-CON) 10 MEQ tablet Take 1 tablet by mouth daily. 06/21/17  Yes [provider]  trimethoprim (TRIMPEX) 100 MG tablet Take 1 tablet by mouth daily. 06/21/17  Yes [provider]  nitroGLYCERIN (NITROSTAT) 0.4 MG SL tablet Place 1 tablet (0.4 mg total) under the tongue every 5 (five) minutes as needed for chest pain. 08/05/15   Asencion Noble, MD    Family History Family History  Problem Relation Age of Onset  . Cancer Paternal Aunt   . Stroke Paternal Uncle     Social History Social History   Tobacco Use  . Smoking status: Former Smoker    Packs/day: 0.10    Types: Cigarettes    Start date: 02/03/1931    Last attempt to quit: 02/03/1951    Years since quitting: 66.4  . Smokeless tobacco: Never Used  Substance Use Topics  . Alcohol use: No    Alcohol/week: 0.0 oz  . Drug use: No     Allergies   Amoxicillin   Review of Systems Review of Systems  Unable to perform ROS: Dementia  Constitutional: Negative for chills and fever.  HENT: Negative for rhinorrhea and sore throat.   Eyes: Negative for visual disturbance (blind left).  Respiratory: Negative for cough and shortness of breath.   Cardiovascular: Positive for chest pain and syncope.  Gastrointestinal: Positive for diarrhea and nausea. Negative for vomiting.  Genitourinary: Negative for dysuria.  Musculoskeletal: Negative for back pain and neck pain.  Skin:  Negative for rash.  Neurological: Negative for headaches.     Physical Exam Updated Vital Signs BP 106/88   Pulse 95   Temp 98 F (36.7 C) (Oral)   Resp (!) 24   Ht 5' (1.524 m)   Wt 73.5 kg (162 lb)   SpO2 94%   BMI 31.64 kg/m   Physical Exam  Constitutional: She appears well-developed and well-nourished. No distress.  HENT:  Head: Normocephalic and atraumatic.  Eyes: Conjunctivae are  normal.  Cloudy left cornea.  Neck: Neck supple.  Cardiovascular: Normal rate and regular rhythm.  No murmur heard. Pulmonary/Chest: Effort normal and breath sounds normal. No respiratory distress.  Abdominal: Soft. There is no tenderness.  Musculoskeletal: She exhibits no edema or deformity.  Neurological: She is alert.  Skin: Skin is warm and dry.  Psychiatric: She has a normal mood and affect.  Nursing note and vitals reviewed.    ED Treatments / Results  Labs (all labs ordered are listed, but only abnormal results are displayed) Labs Reviewed  BASIC METABOLIC PANEL - Abnormal; Notable for the following components:      Result Value   Sodium 134 (*)    Glucose, Bld 178 (*)    BUN 36 (*)    Creatinine, Ser 1.50 (*)    Calcium 8.4 (*)    GFR calc non Af Amer 28 (*)    GFR calc Af Amer 32 (*)    All other components within normal limits  CBC - Abnormal; Notable for the following components:   RBC 2.75 (*)    Hemoglobin 8.8 (*)    HCT 27.2 (*)    Platelets 113 (*)    All other components within normal limits  TROPONIN I - Abnormal; Notable for the following components:   Troponin I 0.28 (*)    All other components within normal limits    EKG EKG Interpretation  Date/Time:  04-Aug-2017 11:42:41 EDT Ventricular Rate:  95 PR Interval:    QRS Duration: 141 QT Interval:  409 QTC Calculation: 515 R Axis:   -19 Text Interpretation:  Sinus rhythm Left bundle branch block similar pattern to prior 7/18 Confirmed by Aletta Edouard [phone]) on 08/04/2017 11:50:14  AM   Radiology Dg Chest 2 View  Result Date: 08/04/2017 CLINICAL DATA:  Chest pain EXAM: CHEST - 2 VIEW COMPARISON:  08/10/2016 FINDINGS: Cardiac shadow is stable. Aortic calcifications are seen. The lungs are well aerated bilaterally. No focal infiltrate or sizable effusion is seen. No acute bony abnormality is noted. IMPRESSION: No active cardiopulmonary disease. Electronically Signed   By: Inez Catalina M.D.   On: 2017-08-04 13:18    Procedures Procedures (including critical care time)  Medications Ordered in ED Medications - No data to display   Initial Impression / Assessment and Plan / ED Course  I have reviewed the triage vital signs and the nursing notes.  Pertinent labs & imaging results that were available during my care of the patient were reviewed by me and considered in my medical decision making (see chart for details).  Clinical Course as of Jul 16 1499  Mon Jul 16, 2017  1255 Cardiac echo 2/17 - Impressions:  - Mild LVH with LVEF 55-60%. Grade 1 diastolic dysfunction with   increased LV filling pressure. Mild left atrial enlargement. MAC   with mildly calcified mitral leaflets and trivial mitral   regurgitation. Severe calcific aortic stenosis as outlined above.   [MB]  5003 Discussed with Dr. Domenic Polite from cardiology who is known the patient from prior evaluations.  He would not recommend aggressive treatment with her and does not even think he would recommend heparin as she is already on anticoagulation.  His recommendation is to admit the patient and cycle enzymes and to consider palliative care consult.    [MB]  7048 Discussed with Dr. Dyann Kief from the medicine hospitalist service who will evaluate the patient in the ED.   [MB]    Clinical Course User  Index [MB] Hayden Rasmussen, MD     Final Clinical Impressions(s) / ED Diagnoses   Final diagnoses:  Chest pain, unspecified type    ED Discharge Orders    None       Hayden Rasmussen, MD 07/17/17  1044

## 2017-08-16 NOTE — Discharge Instructions (Addendum)
Follow up has been arranged with hospice and they will contact you tomorrow.

## 2017-08-16 NOTE — Care Management Note (Addendum)
Case Management Note  Patient Details  Name: Kristin NordmannSara O Rubio MRN: [id number] Date of Birth: 08/26/1917  Subjective/Objective:      Patient presented to [facility] ED s/p fall. Patient is 84 yo  female with history of dementia. Patient lives at home with 24 hour personal care attendents. Patient's and family goals of care is end of life care Home Hospice services.              Action/Plan: [facility] ED CM received call from Dr. Gwenlyn Rubio concerning patient's family desiring home hospice supportive. CM contacted patient's son Kristin Rubio [phone] to discuss care transitional plan. Son reports that patient has around the clock personal care attendants. He states, that she will not need equipment at this time. CM explained [facility] and he is agreeable and verbalizes understanding. Referral faxed via [facility].  ED CM explained that a Hospice Nurse will contact patient by phone tomorrow to schedule the initial assessment. Updated Dr. Gwenlyn Rubio agreeable with plan. Patient will be discharged home with Home hospice services. Transitional care Information placed on AVS   Expected Discharge Date:       07/25/2017           Expected Discharge Plan:  Home w Hospice Care  In-House Referral:     Discharge planning Services  CM Consult  Post Acute Care Choice:    Choice offered to:  Adult Children((son) Kristin Rubio )  DME Arranged:    DME Agency:     HH Arranged:    HH [facility] Status of Service:  Completed, signed off  If discussed at Long Length of Stay Meetings, dates discussed:    Additional CommentsMichel Rubio:  Kristin Dicker, RN 08/10/2017, 5:54 PM

## 2017-08-16 NOTE — ED Provider Notes (Signed)
Please see ED course by Dr. Charm BargesButler and hospitalist consult by Dr. Gwenlyn PerkingMadera Patient is to be discharged with hospice follow-up.   Kristin Grizzleay, Kristin Zavadil, MD January 02, 2018 [phone]

## 2017-08-16 NOTE — Consult Note (Signed)
Medical Consultation   Kristin NordmannSara O Rubio  ZOX:[id number]  DOB: 06/01/1917  DOA: 08/01/2017  PCP: Carylon PerchesFagan, Roy, MD   Outpatient Specialists: Dr. Diona BrownerMcDowell (cardiologist).  Requesting physician: Dr. Charm BargesButler  Reason for consultation: Chest pain  History of Present Illness: Kristin NordmannSara O Vipond is an 84 y.o. female with past medical history significant for chronic kidney disease is stage IV, gastroesophageal reflux disease, coronary artery disease, osteoporosis, prior history of DVT, essential hypertension, severe aortic stenosis and dementia with behavioral disturbances; who came to the emergency department after experiencing transient episode of chest discomfort for and what appears to be reported as a mild syncope event.  Patient's caregiver reported at the moment that she was assisting her to get up the patient reported chest discomfort in the middle of her chest and following that and then essentially losing consciousness for less than 1 minute with associated paleness.  There was no seizure activity, urine incontinence, tongue biting, vomiting, or any other complaints.  Patient's caregiver reported decrease oral intake, no nausea and weakness that has been ongoing for the last 6323-month. By the time the patient was transported to the emergency department she denies any discomfort, shortness of breath, palpitation, fever, nausea, dysuria, headaches or any other complaints.  In the ED: Chest x-ray demonstrated no acute cardiopulmonary process.  Mild elevation of her troponins and a creatinine of 1.5.  Gentle IV fluids were given and [facility] hospitalist consulted to assist with further evaluation/work-up for her chest discomfort.   Review of Systems:  As per HPI otherwise 10 point review of systems negative.   Past Medical History: Past Medical History:  Diagnosis Date  . Anxiety   . Cardiac murmur 02/02/2014  . Chest pain with moderate risk of acute coronary syndrome 08/02/2015  . CKD (chronic  kidney disease), stage III ([facility]) 08/19/2015  . Essential hypertension, benign   . GERD (gastroesophageal reflux disease)   . History of DVT (deep vein thrombosis)   . Lumbar disc disease   . Macular degeneration   . Osteoporosis   . Osteoporosis 09/29/2011  . Palpitations   . Renal insufficiency   . Severe aortic stenosis 08/19/2015  . Uterine prolapse     Past Surgical History: Past Surgical History:  Procedure Laterality Date  . CERVICAL POLYPECTOMY     Excision of interest cervical/ endometrial lesion  . CHOLECYSTECTOMY    . FINGER GANGLION CYST EXCISION     Mucoid cyst, left middle finger  . KNEE ARTHROSCOPY     Right  . VITRECTOMY     Partial, left eye   Allergies:   Allergies  Allergen Reactions  . Amoxicillin Rash    Has patient had a PCN reaction causing immediate rash, facial/tongue/throat swelling, SOB or lightheadedness with hypotension: Yes Has patient had a PCN reaction causing severe rash involving mucus membranes or skin necrosis: No Has patient had a PCN reaction that required hospitalization: No Has patient had a PCN reaction occurring within the last 10 years: No If all of the above answers are "NO", then may proceed with Cephalosporin use.    Social History:  reports that she quit smoking about 66 years ago. Her smoking use included cigarettes. She started smoking about 86 years ago. She smoked 0.10 packs per day. She has never used smokeless tobacco. She reports that she does not drink alcohol or use drugs.   Family History: Family History  Problem Relation Age of  Onset  . Cancer Paternal Aunt   . Stroke Paternal Uncle     Physical Exam: Vitals:   08/12/2017 1147 08/12/2017 1200 08/14/2017 1510 08/05/2017 1712  BP:  106/88 (!) 106/47 107/61  Pulse:  95 86 63  Resp:  (!) 24 15 16   Temp:      TempSrc:      SpO2:  94% 100% 95%  Weight: 73.5 kg (162 lb)     Height: 5' (1.524 m)       Constitutional: Chronically ill in appearance, alert, awake and  oriented to person; in no acute distress and afebrile.   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  ENMT: external ears and nose appear normal, normal hearing. Lips appears normal, oropharynx mucosa, tongue, posterior pharynx appear normal  Neck: neck appears normal, no masses, normal ROM, no thyromegaly,  CVS: Heart rate, no rubs, no gallops, positive systolic ejection murmur, no appreciated JVD.  Respiratory:  clear to auscultation bilaterally, no wheezing, rales or rhonchi. Respiratory effort normal. No accessory muscle use.  Abdomen: soft nontender, nondistended, normal bowel sounds, no hepatosplenomegaly, no hernias  Musculoskeletal: : no cyanosis, clubbing or edema noted bilaterally Neuro: Cranial nerves II-XII intact, MS 3/5 bilaterally and simmetrically, due to poor effort. Psych: Poor insight in the setting of underlying dementia, no agitation, no hallucinations; mood is appropriate. Skin: no rashes or lesions or ulcers, no induration or nodules   Data reviewed:  I have personally reviewed following labs and imaging studies Labs:  CBC: Recent Labs  Lab 07/28/2017 1255  WBC 9.9  HGB 8.8*  HCT 27.2*  MCV 98.9  PLT 113*    Basic Metabolic Panel: Recent Labs  Lab 08/06/2017 1255  NA 134*  K 3.5  CL 98  CO2 28  GLUCOSE 178*  BUN 36*  CREATININE 1.50*  CALCIUM 8.4*   GFR Estimated Creatinine Clearance: 18.7 mL/min (A) (by C-G formula based on SCr of 1.5 mg/dL (H)).  Cardiac Enzymes: Recent Labs  Lab 08/03/2017 1255  TROPONINI 0.28*   Inpatient Medications:   Scheduled Meds: Continuous Infusions: . sodium chloride      Radiological Exams on Admission: Dg Chest 2 View  Result Date: 08/01/2017 CLINICAL DATA:  Chest pain EXAM: CHEST - 2 VIEW COMPARISON:  08/10/2016 FINDINGS: Cardiac shadow is stable. Aortic calcifications are seen. The lungs are well aerated  bilaterally. No focal infiltrate or sizable effusion is seen. No acute bony abnormality is noted. IMPRESSION: No active cardiopulmonary disease. Electronically Signed   By: Alcide Clever M.D.   On: 08/12/2017 13:18    Impression/Recommendations 1-chest pain 2-hypertension 3-nausea/vomiting 4-gastroesophageal reflux disease 5-failure to thrive, deconditioning 6-chronic kidney disease is stage IV.  Plan: -Patient with a heart score of 4, known coronary artery disease and also severe aortic stenosis. -She has a chronic kidney disease stage IV and is not a candidate for interventions. -Case discussed with cardiology who recommended medical management and no ischemic work-up. -Long discussion with family at bedside expressing what we can offer at this point and what we recommend as the base approach; family in agreement and no looking to pursued any treatment at this time. -Patient denies chest pain, shortness of breath, abdominal pain, palpitations and diaphoresis. -Will continue the use of metoprolol, as needed nitroglycerin, imdur, and Cardizem. -Patient will continue the use of Eliquis twice a day for now; with instruction to review with primary care physician now that  they are more interested in hospice and symptom management approach. -Given decreased oral intake and ongoing nausea; will recommend the use of as needed Zofran and Protonix dose change to twice a day. -Case manager was contacted and they will set up outpatient follow-up with hospice to provide further services, care and equipment to assist patient's family members in the Journey of keeping Kristin Rubio Comfortable.   Thank you for this consultation.  After lengthy discussion with family members at bedside and assessing patient's current symptoms decision has been made of not pursuing hospitalization, any invasive intervention and discharge home with hospice set up.  Patient has also been transitioned to a DNR/DNI.   Time  Spent: 50 minutes  Vassie Loll M.D. [facility] 2017/07/23, 5:43 PM

## 2017-08-16 NOTE — ED Notes (Signed)
Burna MortimerWanda from Case Management from [facility] to fax information to [facility] hospice information to son for discharge.

## 2017-08-16 NOTE — ED Notes (Signed)
Date and time results received: 07/19/2017 1410 (use smartphrase ".now" to insert current time)  Test: Troponin Critical Value: 0.28  Name of Provider Notified: Charm BargesButler  Orders Received? Or Actions Taken?: Orders Received - See Orders for details

## 2017-08-16 DEATH — deceased

## 2018-01-03 IMAGING — CT CT CERVICAL SPINE W/O CM
3 of 6 series · 15 of 33 positions shown, 17 images · non-contrast
Comparison: Head CT 01/09/2016

CLINICAL DATA: Unwitnessed fall.  Found on floor.  Confusion.

EXAM:
CT HEAD WITHOUT CONTRAST
CT CERVICAL SPINE WITHOUT CONTRAST
TECHNIQUE: Multidetector CT imaging of the head and cervical spine was
performed following the standard protocol without intravenous
contrast. Multiplanar CT image reconstructions of the cervical spine
were also generated.

[Series 4: coronal soft tissue · coronal · 0.34mm/px · 2 of 84 slices shown]
[im 28/84  bone]
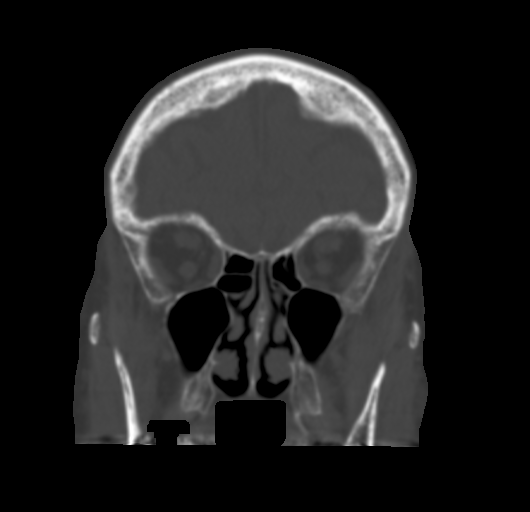
[im 56/84  bone]
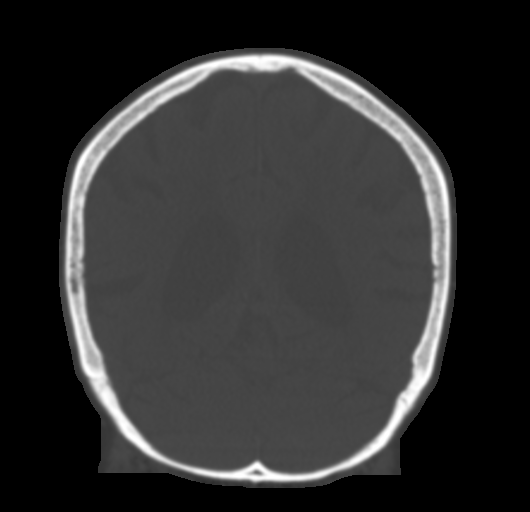

[Series 7: c spine soft · axial · 0.28mm/px · z∈[+107,+223]mm · 8 of 76 slices shown, 10 images]
[im 9/76  soft-tissue]
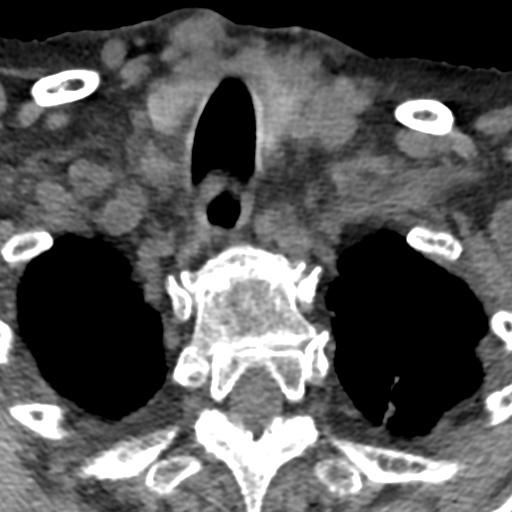
[im 9/76  bone]
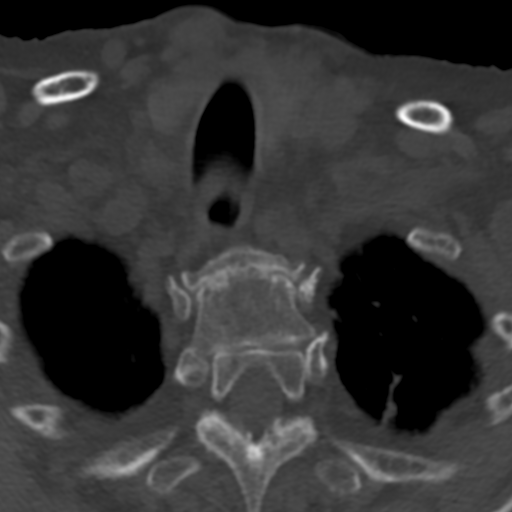
[im 17/76  bone]
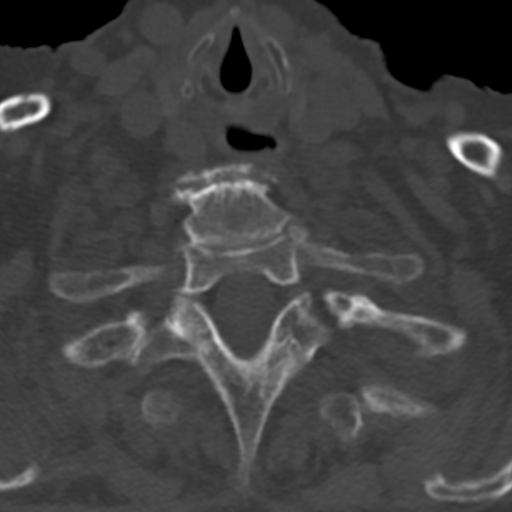
[im 26/76  bone]
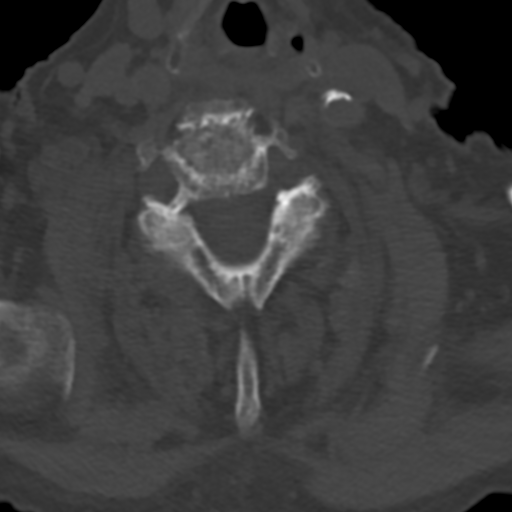
[im 34/76  bone]
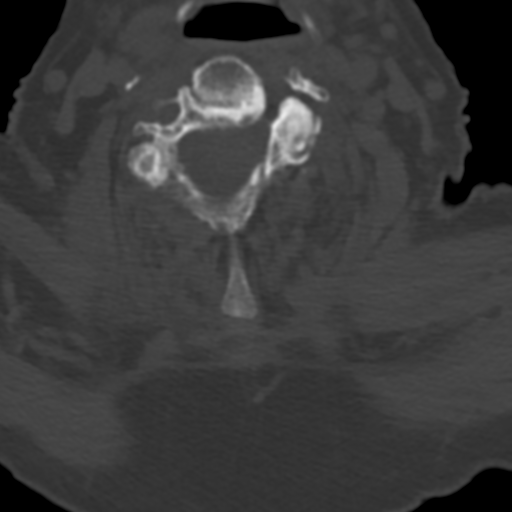
[im 42/76  soft-tissue]
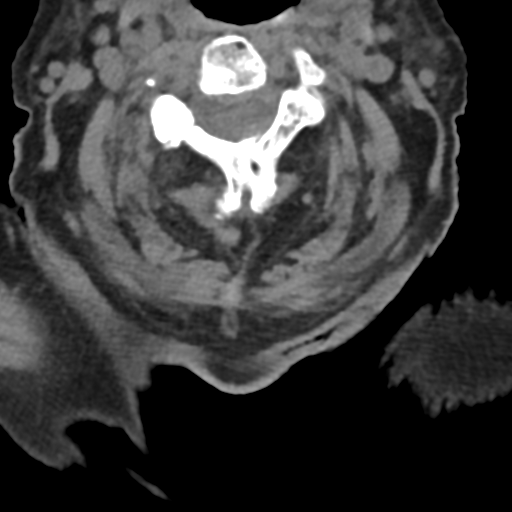
[im 42/76  bone]
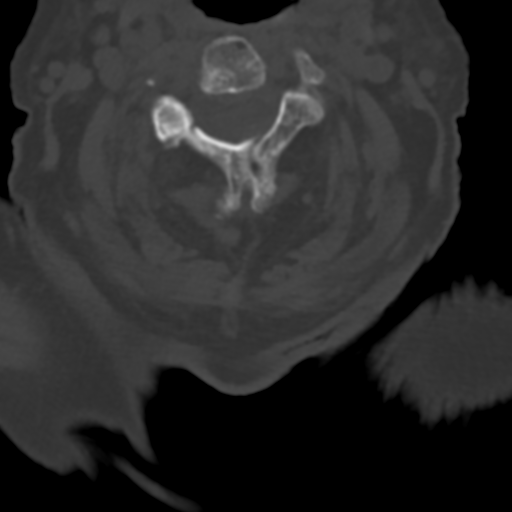
[im 51/76  bone]
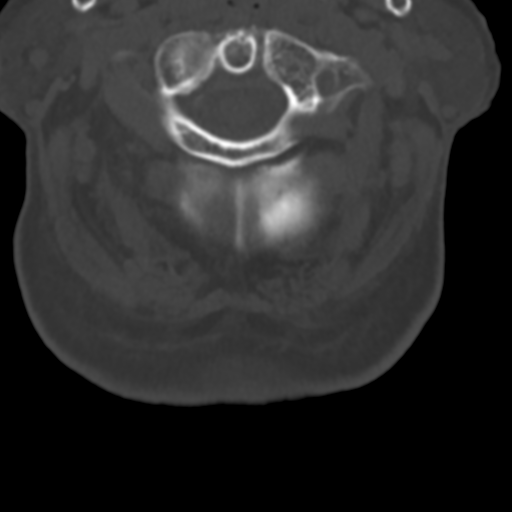
[im 59/76  bone]
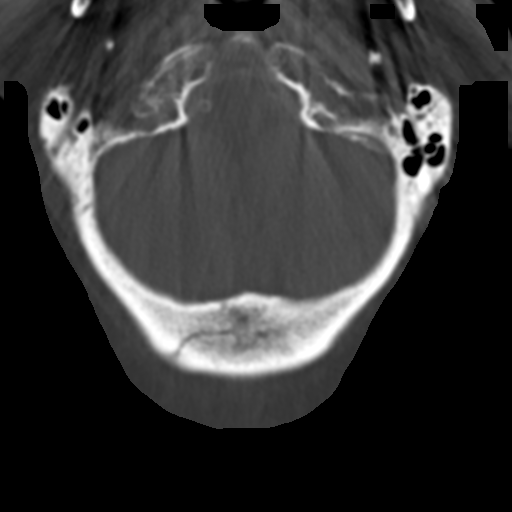
[im 67/76  bone]
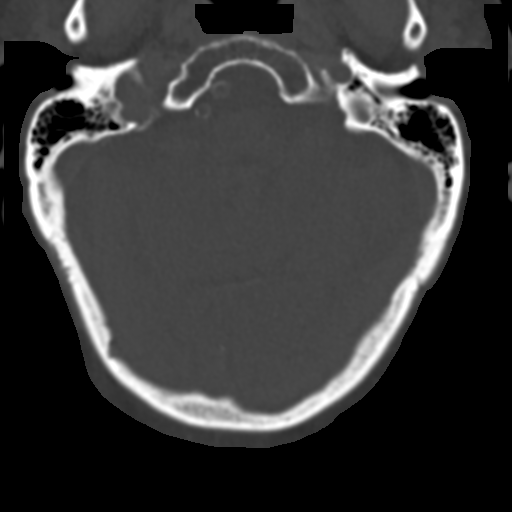

[Series 8: sagittal bone · sagittal · 0.30mm/px · 5 of 61 slices shown]
[im 11/61  bone]
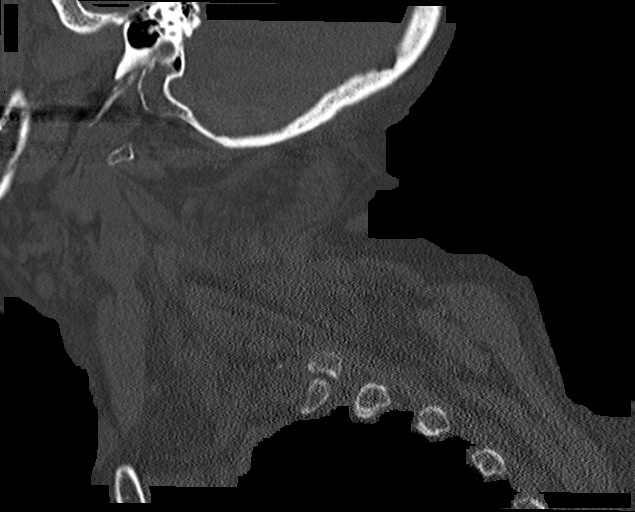
[im 21/61  bone]
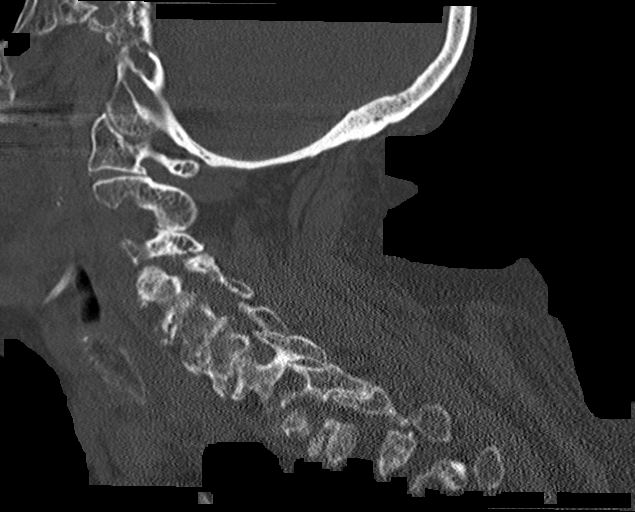
[im 31/61  bone]
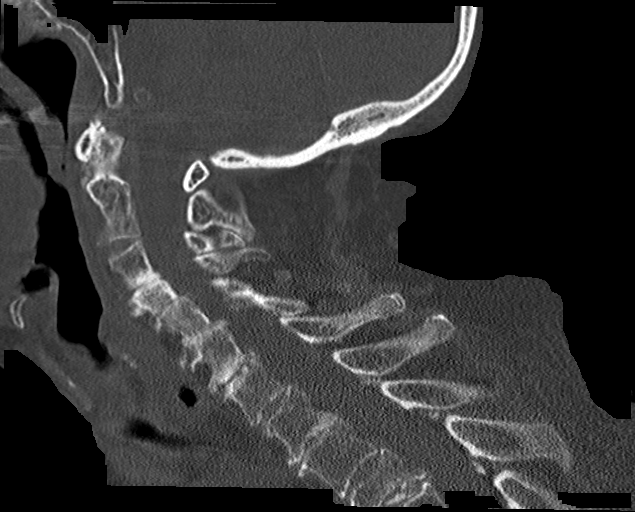
[im 41/61  bone]
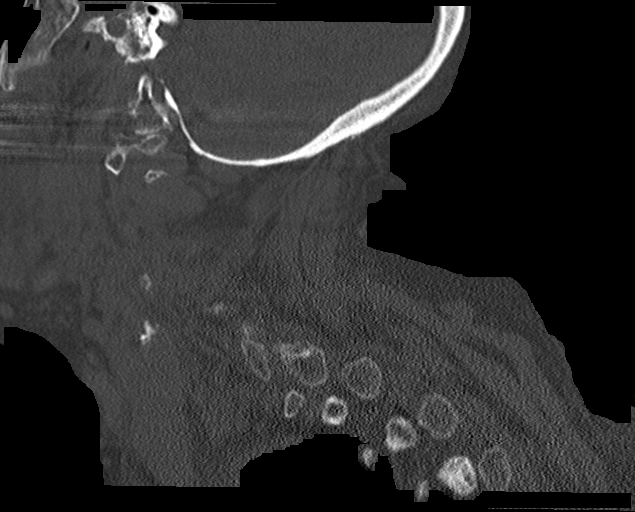
[im 51/61  bone]
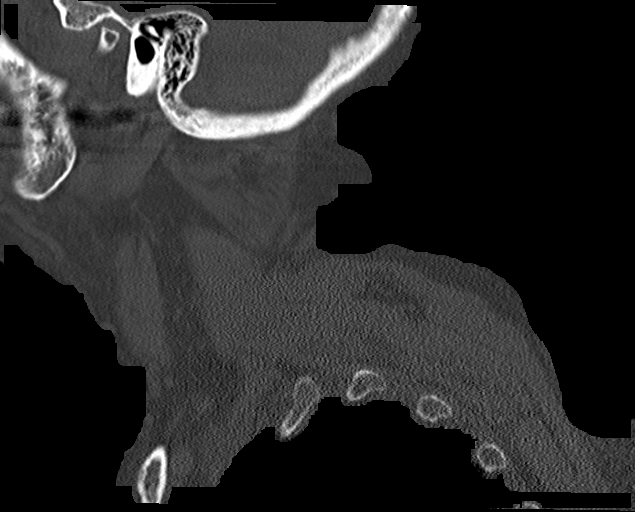

[15 of 33 positions shown; findings below may reference images not displayed]

FINDINGS: CT HEAD FINDINGS

Brain: No evidence of acute hemorrhage, hydrocephalus, extra-axial
collection or mass lesion/mass effect. Hypoattenuated appearance of
the pons. Advanced brain parenchymal volume loss and periventricular
microangiopathy.

Vascular: Calcific atherosclerotic disease at the skullbase.

Skull: Normal. Negative for fracture or focal lesion.

Sinuses/Orbits: Chronic ethmoid sinusitis, otherwise normal.

Other: None.

CT CERVICAL SPINE FINDINGS

Alignment: 1-2 mm anterolisthesis of C4 on C5, C5 on C6 and C7 on
T1, likely degenerative.

Skull base and vertebrae: No acute fracture. No primary bone lesion
or focal pathologic process.

Soft tissues and spinal canal: No prevertebral fluid or swelling. No
visible canal hematoma.

Disc levels: Multilevel osteoarthritic changes with disc space
narrowing, endplate sclerosis and osteophytosis. Posterior facet
arthropathy, moderate to advanced. Degenerative ankylosing of the
lateral process of C4 and C5 on the left.

Upper chest: Negative.

Other: Calcific atherosclerotic disease of the carotid arteries.
IMPRESSION: No evidence of acute traumatic injury to the head.

Hypoattenuated appearance of the pons, nonspecific finding, as this
part of the brain is prone to artifacts. If clinical suspicion for
pontine infarct, then MRI of the head should be considered.

Advanced brain parenchymal volume loss and periventricular
microangiopathy.

No evidence of acute traumatic injury to the cervical spine.

Multilevel osteoarthritic changes, moderate to severe, with minimal
anterolisthesis at several levels of the mid and lower cervical
spine.
